# Patient Record
Sex: Female | Born: 1950 | Race: Black or African American | Hispanic: Refuse to answer | Smoking: Never smoker
Health system: Southern US, Community
[De-identification: ages and names within clinical notes are randomized; demographics above are authoritative.]

## PROBLEM LIST (undated history)

## (undated) DIAGNOSIS — I10 Essential (primary) hypertension: Secondary | ICD-10-CM

## (undated) DIAGNOSIS — M1990 Unspecified osteoarthritis, unspecified site: Secondary | ICD-10-CM

## (undated) DIAGNOSIS — E119 Type 2 diabetes mellitus without complications: Secondary | ICD-10-CM

## (undated) HISTORY — DX: Type 2 diabetes mellitus without complications: E11.9

---

## 2001-07-14 ENCOUNTER — Emergency Department (HOSPITAL_COMMUNITY): Admission: EM | Admit: 2001-07-14 | Discharge: 2001-07-14 | Payer: Self-pay | Admitting: Emergency Medicine

## 2001-07-14 ENCOUNTER — Encounter: Payer: Self-pay | Admitting: Emergency Medicine

## 2003-03-10 ENCOUNTER — Emergency Department (HOSPITAL_COMMUNITY): Admission: EM | Admit: 2003-03-10 | Discharge: 2003-03-10 | Payer: Self-pay | Admitting: Family Medicine

## 2003-12-07 ENCOUNTER — Emergency Department (HOSPITAL_COMMUNITY): Admission: EM | Admit: 2003-12-07 | Discharge: 2003-12-07 | Payer: Self-pay | Admitting: Emergency Medicine

## 2006-04-18 ENCOUNTER — Emergency Department (HOSPITAL_COMMUNITY): Admission: EM | Admit: 2006-04-18 | Discharge: 2006-04-18 | Payer: Self-pay | Admitting: Emergency Medicine

## 2006-09-02 ENCOUNTER — Emergency Department (HOSPITAL_COMMUNITY): Admission: EM | Admit: 2006-09-02 | Discharge: 2006-09-02 | Payer: Self-pay | Admitting: Emergency Medicine

## 2007-02-22 ENCOUNTER — Inpatient Hospital Stay (HOSPITAL_COMMUNITY): Admission: EM | Admit: 2007-02-22 | Discharge: 2007-02-24 | Payer: Self-pay | Admitting: Emergency Medicine

## 2007-02-22 ENCOUNTER — Ambulatory Visit: Payer: Self-pay | Admitting: Cardiology

## 2007-02-23 ENCOUNTER — Encounter (INDEPENDENT_AMBULATORY_CARE_PROVIDER_SITE_OTHER): Payer: Self-pay | Admitting: Internal Medicine

## 2007-03-19 ENCOUNTER — Emergency Department (HOSPITAL_COMMUNITY): Admission: EM | Admit: 2007-03-19 | Discharge: 2007-03-19 | Payer: Self-pay | Admitting: Emergency Medicine

## 2007-04-01 ENCOUNTER — Emergency Department (HOSPITAL_COMMUNITY): Admission: EM | Admit: 2007-04-01 | Discharge: 2007-04-01 | Payer: Self-pay | Admitting: Emergency Medicine

## 2007-04-11 ENCOUNTER — Emergency Department (HOSPITAL_COMMUNITY): Admission: EM | Admit: 2007-04-11 | Discharge: 2007-04-11 | Payer: Self-pay | Admitting: Family Medicine

## 2007-04-18 ENCOUNTER — Emergency Department (HOSPITAL_COMMUNITY): Admission: EM | Admit: 2007-04-18 | Discharge: 2007-04-18 | Payer: Self-pay | Admitting: Family Medicine

## 2007-05-12 ENCOUNTER — Emergency Department (HOSPITAL_COMMUNITY): Admission: EM | Admit: 2007-05-12 | Discharge: 2007-05-12 | Payer: Self-pay | Admitting: Emergency Medicine

## 2007-07-02 ENCOUNTER — Emergency Department (HOSPITAL_COMMUNITY): Admission: EM | Admit: 2007-07-02 | Discharge: 2007-07-02 | Payer: Self-pay | Admitting: Emergency Medicine

## 2007-07-11 ENCOUNTER — Encounter: Admission: RE | Admit: 2007-07-11 | Discharge: 2007-07-11 | Payer: Self-pay | Admitting: Chiropractic Medicine

## 2009-07-03 ENCOUNTER — Ambulatory Visit (HOSPITAL_COMMUNITY): Admission: RE | Admit: 2009-07-03 | Discharge: 2009-07-03 | Payer: Self-pay | Admitting: Family Medicine

## 2010-03-21 LAB — COMPREHENSIVE METABOLIC PANEL
CO2: 27 mEq/L (ref 19–32)
Calcium: 9.2 mg/dL (ref 8.4–10.5)
Creatinine, Ser: 0.94 mg/dL (ref 0.4–1.2)
GFR calc Af Amer: >60 mL/min (ref >60)
GFR calc non Af Amer: >60 mL/min (ref >60)
Glucose, Bld: 115 mg/dL — ABNORMAL HIGH (ref 70–99)
Sodium: 140 mEq/L (ref 135–145)
Total Protein: 7.2 g/dL (ref 6.0–8.3)

## 2010-03-21 LAB — CBC
HCT: 37.3 % (ref 36.0–46.0)
Hemoglobin: 12.5 g/dL (ref 12.0–15.0)
MCH: 27.2 pg (ref 26.0–34.0)
MCHC: 33.6 g/dL (ref 30.0–36.0)
RDW: 16 % — ABNORMAL HIGH (ref 11.5–15.5)

## 2010-03-21 LAB — LIPID PANEL
Cholesterol: 172 mg/dL (ref 0–200)
HDL: 53 mg/dL (ref >39)
Total CHOL/HDL Ratio: 3.2 RATIO
Triglycerides: 85 mg/dL (ref <150)

## 2010-05-18 NOTE — H&P (Signed)
NAMEPAQUITA, PRINTY NO.:  [id number]   MEDICAL RECORD NO.:  [id number]          PATIENT TYPE:  EMS   LOCATION:  MAJO                         FACILITY:  [facility]   PHYSICIAN:  Ladell Pier, M.D.   DATE OF BIRTH:  [birth year]   DATE OF ADMISSION:  02/22/2007  DATE OF DISCHARGE:                              HISTORY & PHYSICAL   CHIEF COMPLAINT:  Shortness of breath.   HISTORY OF PRESENT ILLNESS:  The patient is a 60 year old African-  American female who presented to the emergency room secondary to  increase in shortness of breath.  It has been going on for about a week.  She saw Dr. Ronne Binning 3 days ago.  Was given an albuterol inhaler, but  she noted that the shortness of breath has not improved.  She has had no  fevers at home, but she states that when she went to Dr. Dimas Millin  office on Monday her temperature was up to 103.  She has had no cough.   PAST MEDICAL HISTORY:  Significant for morbid obesity.  She has had  pelvic inflammatory disease for 12 years.  No other significant past  medical history.   FAMILY HISTORY:  Mother has hypertension.  She is 76. Her father died in  the [place] War.   SOCIAL HISTORY:  She moved here from [place].  She has 3 children.  She  quit tobacco many years ago.  She only smoked about 4 years, when she  was in college.  No alcohol use.  She is employed at [employer].   MEDICATIONS:  Albuterol p.r.n., recently started.   ALLERGIES:  None.   REVIEW OF SYSTEMS:  As per stated in the HPI.  Otherwise negative.   PHYSICAL EXAMINATION:  VITAL SIGNS: Temperature 97, blood pressure  174/111, pulse 91, respirations 16, pulse ox 100% on 2 L.  HEENT:  Normocephalic, atraumatic.  Pupils reactive to light.  Throat  without erythema.  CARDIOVASCULAR:  She is slightly tachycardic.  LUNGS:  Clear to auscultation bilaterally.  ABDOMEN:  Obese.  Positive bowel sounds.  EXTREMITIES:  There is 2+ edema bilaterally.   LABORATORY  DATA:  WBC 10.5, hemoglobin 11.7, MCV 78.9, platelet 288.  Myoglobin 70, MB 2.1, troponin less than 0.05.  Sodium 140, potassium  3.6, chloride 106, BUN 15, glucose 143, creatinine 1.1.  Chest x-ray  showed cardiomegaly with vascular congestion and suspected early  interstitial edema.  Focal opacity in the peripheral left upper lobe,  atelectasis versus early infiltrate.  Recommend followup chest x-ray.  EKG sinus bradycardia, 56, with some PVCs, bigeminy.   ASSESSMENT:  1. Pneumonia.  2. Pulmonary edema with increased BNP.  3. Hypertensive urgency.  4. Normocytic anemia.  5. Morbid obesity.  6. Peripheral edema.   PLAN:  We will admit the patient to the hospital.  We will start IV  Avelox.  we will get a 2-D echo, fasting lipid panel.  We will treat her  with p.r.n. Lopressor for systolic blood pressure greater than 160.  We  will cycle cardiac enzymes.  We will treat her with Lasix 40  IV q.12h.  and KCl 40 daily, and cardiac diet and exercise.  The patient most  likely has hypertension.  She stated that she has been to the doctor's  office recently and her blood pressure was normal so we will monitor her  blood pressure for now.  If it continues to be elevated we will start  her on a regular dose scheduled antihypertensive.      Ladell Pier, M.D.  Electronically Signed     NJ/MEDQ  D:  02/22/2007  T:  02/22/2007  Job:  [id number]   cc:   Dr. Ronne Binning

## 2010-05-18 NOTE — Discharge Summary (Signed)
NAMEMARGERITE, [name]             ACCOUNT NO.:  [id number]   MEDICAL RECORD NO.:  [id number]          PATIENT TYPE:  INP   LOCATION:  4731                         FACILITY:  [facility]   PHYSICIAN:  Ladell Pier, M.D.   DATE OF BIRTH:  17-Mar-1950   DATE OF ADMISSION:  02/22/2007  DATE OF DISCHARGE:  02/24/2007                               DISCHARGE SUMMARY   DISCHARGE DIAGNOSES:  1. Shortness of breath secondary to pulmonary edema and pneumonia.  2. Elevated BNP.  3. Hypertensive urgency.  4. Normocytic anemia.  5. Morbid obesity.  6. Peripheral edema.  7. Abnormal liver function test.  8. Hyperglycemia.   DISCHARGE MEDICATIONS:  1. Avelox 400 mg daily x7 days.  2. HCTZ 25 mg daily.  3. Lotensin 20 mg daily.  4. Lasix 20 mg as needed daily p.r.n. for edema.   FOLLOWUP APPOINTMENT:  Patient to follow up with Dr. Ronne Binning early this  coming up week.   PROCEDURES:  None.   CONSULTANTS:  None.   HPI:  Patient is a 60 year old African-American female that presented to  the emergency room secondary to increasing shortness of breath.  Patient  stated that this has been going on for about a week.  She saw Dr.  Ronne Binning 3 days ago, was given an albuterol inhaler.  She stated that  her symptoms continued to get worse.  Please see admission note for  remainder of history.   PAST MEDICAL HISTORY/FAMILY HISTORY/SOCIAL HISTORY/MEDS/ALLERGIES/REVIEW  OF SYSTEM:  Per Admission H&P.   PHYSICAL EXAM ON DISCHARGE:  Temperature 98.1, pulse of 93, respirations  20, blood pressure 135/91, pulse ox. 96% on room air.  HEENT:  Head is normocephalic, atraumatic.  Pupils reactive to light.  Throat without erythema.  CARDIOVASCULAR:  Regular rate, rhythm.  LUNGS:  Clear bilaterally.  ABDOMEN:  Positive bowel sounds.  EXTREMITIES:  Trace edema.   HOSPITAL COURSE:  1. Pneumonia:  Patient was admitted to the hospital, started on IV      Avelox and given neb treatments.  Her symptoms improved.  2. Pulmonary edema/elevated BNP:  She had pulmonary edema and elevated      BNP.  This could be secondary to the hypertensive urgency.  Her 2D      echocardiogram showed normal pump and function.  Her cardiac      enzymes the relative index was slightly elevated.  This could be      from the elevated blood pressure.  She should follow up with her      primary care physician to see if further workup is needed.  3. Hyperglycemia:  Her blood sugar on her morning lab was elevated.      Will check a hemoglobin A1c but she should follow up with her      primary care doctor to be screened for diabetes.  4. Normocytic anemia:  Patient should also follow up with her primary      care physician secondary to anemia.  Anemia panel was done in the      hospital.  I am not sure when her last colonoscopy was.  5.  Peripheral edema:  She was given Lasix which improved her      congestion, vascular congestion on chest x-ray and also improved      her breathing and her peripheral edema.  6. Abnormal LFTs:  Liver function was slightly elevated, this could be      secondary to hepatic congestion.  She should follow up for she had      an ultrasound ordered which has not been done yet.  She should      follow up outpatient for further workup.  7. Hypoalbuminemia:  Her abdomen was slightly down.  UA was ordered      while she was an inpatient which was not done, will try to get it      done before she leaves.  She should follow up for the results with      her primary care doctor as well.  8. Pulmonary infiltrate:  Patient to follow for repeat chest x-ray to      evaluate resolution of pneumonia.  9. Morbid obesity:  Encouraged diet and exercise.  Patient could have      obstructive sleep apnea as well with her weight and her morbid      obesity.  She could get that worked up outpatient.   Blood cultures negative x2.  CK 89, MB 1.7, sodium 141, potassium 3.6,  chloride 102, CO2 30, glucose 158, BUN 18,  creatinine 1.18.  AST 29, ALT  69, total protein 6.1, albumin 3, TSH 1.189, total cholesterol 141,  triglycerides 164, HDL 38, LDL 70.  Anemia panel:  Absolute retic count  is 95, iron 50, TIBC 339, % sats 15, Vitamin B12 level of 289, ferritin  58, folate 16.2, BNP 699.  First set of cardiac enzymes:  CK 126, MB  3.8, relative index 3, troponin is 0.04.  Chest x-ray showed  cardiomegaly with vascular congestion, suspect early interstitial edema,  focal opacity in the periperipheral left upper lobe, atelectasis vs.  early infiltrate.  Recommend followup chest x-ray for clearing.  Two-D  echocardiogram:  Overall LV systolic function was 50-55%, study was  inadequate for the evaluation of left ventricular regional wall motion.      Ladell Pier, M.D.  Electronically Signed     NJ/MEDQ  D:  02/24/2007  T:  02/25/2007  Job:  [id number]   cc:   Lorelle Formosa, M.D.

## 2010-09-24 LAB — LIPID PANEL
Cholesterol: 141
LDL Cholesterol: 70

## 2010-09-24 LAB — I-STAT 8, (EC8 V) (CONVERTED LAB)
Acid-Base Excess: 1
Bicarbonate: 26.3 — ABNORMAL HIGH
Chloride: 106
pCO2, Ven: 43.6 — ABNORMAL LOW
pH, Ven: 7.389 — ABNORMAL HIGH

## 2010-09-24 LAB — COMPREHENSIVE METABOLIC PANEL
AST: 29
Albumin: 3 — ABNORMAL LOW
Alkaline Phosphatase: 155 — ABNORMAL HIGH
BUN: 18
BUN: 20
CO2: 29
Chloride: 105
Creatinine, Ser: 1.14
Creatinine, Ser: 1.18
GFR calc Af Amer: 57 — ABNORMAL LOW
GFR calc non Af Amer: 49 — ABNORMAL LOW
Potassium: 3.6
Total Bilirubin: 0.5
Total Protein: 6.1

## 2010-09-24 LAB — CBC
HCT: 34.3 — ABNORMAL LOW
HCT: 35.1 — ABNORMAL LOW
HCT: 36.2
Hemoglobin: 11.7 — ABNORMAL LOW
MCHC: 32.5
MCV: 78.9
MCV: 79.9
Platelets: 284
Platelets: 288
RDW: 18.2 — ABNORMAL HIGH
RDW: 18.3 — ABNORMAL HIGH
RDW: 18.5 — ABNORMAL HIGH
WBC: 12.6 — ABNORMAL HIGH

## 2010-09-24 LAB — CK TOTAL AND CKMB (NOT AT ARMC)
CK, MB: 1.5
CK, MB: 1.7
CK, MB: 2.2
CK, MB: 3.8
Relative Index: INVALID
Total CK: 89
Total CK: 93

## 2010-09-24 LAB — POCT I-STAT CREATININE: Operator id: 114141

## 2010-09-24 LAB — CULTURE, BLOOD (ROUTINE X 2)

## 2010-09-24 LAB — DIFFERENTIAL
Basophils Absolute: 0
Basophils Relative: 0
Eosinophils Absolute: 0.1
Eosinophils Relative: 1
Neutrophils Relative %: 81 — ABNORMAL HIGH

## 2010-09-24 LAB — B-NATRIURETIC PEPTIDE (CONVERTED LAB): Pro B Natriuretic peptide (BNP): 699 — ABNORMAL HIGH

## 2010-09-24 LAB — FERRITIN: Ferritin: 58 (ref 10–291)

## 2010-09-24 LAB — POCT CARDIAC MARKERS
CKMB, poc: 2.1
Myoglobin, poc: 70
Troponin i, poc: <0.05

## 2010-09-24 LAB — URINALYSIS, ROUTINE W REFLEX MICROSCOPIC
Ketones, ur: NEGATIVE
Nitrite: NEGATIVE
Protein, ur: NEGATIVE

## 2010-09-24 LAB — HEMOGLOBIN A1C: Mean Plasma Glucose: 168

## 2010-09-24 LAB — IRON AND TIBC
Iron: 50
Saturation Ratios: 15 — ABNORMAL LOW
TIBC: 339
UIBC: 289

## 2010-09-24 LAB — RETICULOCYTES: RBC.: 4.75

## 2010-09-24 LAB — VITAMIN B12: Vitamin B-12: 503 (ref 211–911)

## 2010-09-27 LAB — CBC
HCT: 37.2
Hemoglobin: 12.1
MCHC: 32.6
MCV: 77.7 — ABNORMAL LOW
Platelets: 282
RBC: 4.79
RDW: 18.5 — ABNORMAL HIGH
WBC: 10.6 — ABNORMAL HIGH

## 2010-09-27 LAB — DIFFERENTIAL
Basophils Absolute: 0.1
Lymphocytes Relative: 16
Lymphs Abs: 1.7
Monocytes Relative: 4
Neutrophils Relative %: 78 — ABNORMAL HIGH

## 2010-09-27 LAB — BASIC METABOLIC PANEL WITH GFR
BUN: 16
CO2: 25
Calcium: 9.1
Chloride: 106
Creatinine, Ser: 0.91
GFR calc non Af Amer: >60
Glucose, Bld: 108 — ABNORMAL HIGH
Potassium: 3.4 — ABNORMAL LOW
Sodium: 139

## 2010-09-28 LAB — POCT I-STAT, CHEM 8
Creatinine, Ser: 1.3 — ABNORMAL HIGH
Hemoglobin: 13.9
Sodium: 141
TCO2: 28

## 2012-01-22 ENCOUNTER — Emergency Department (HOSPITAL_COMMUNITY): Payer: Medicare Other

## 2012-01-22 ENCOUNTER — Emergency Department (HOSPITAL_COMMUNITY)
Admission: EM | Admit: 2012-01-22 | Discharge: 2012-01-22 | Disposition: A | Discharge status: Home / Self Care | Payer: Medicare Other | Attending: Emergency Medicine | Admitting: Emergency Medicine

## 2012-01-22 ENCOUNTER — Other Ambulatory Visit: Payer: Self-pay

## 2012-01-22 ENCOUNTER — Encounter (HOSPITAL_COMMUNITY): Payer: Self-pay | Admitting: Nurse Practitioner

## 2012-01-22 DIAGNOSIS — Z79899 Other long term (current) drug therapy: Secondary | ICD-10-CM | POA: Insufficient documentation

## 2012-01-22 DIAGNOSIS — R062 Wheezing: Secondary | ICD-10-CM | POA: Insufficient documentation

## 2012-01-22 DIAGNOSIS — Z8739 Personal history of other diseases of the musculoskeletal system and connective tissue: Secondary | ICD-10-CM | POA: Insufficient documentation

## 2012-01-22 DIAGNOSIS — I10 Essential (primary) hypertension: Secondary | ICD-10-CM | POA: Insufficient documentation

## 2012-01-22 HISTORY — DX: Essential (primary) hypertension: I10

## 2012-01-22 LAB — POCT I-STAT TROPONIN I: Troponin i, poc: 0 ng/mL (ref 0.00–0.08)

## 2012-01-22 LAB — BASIC METABOLIC PANEL
CO2: 29 mEq/L (ref 19–32)
Calcium: 10.4 mg/dL (ref 8.4–10.5)
GFR calc Af Amer: 81 mL/min — ABNORMAL LOW (ref >90)
GFR calc non Af Amer: 69 mL/min — ABNORMAL LOW (ref >90)
Sodium: 138 mEq/L (ref 135–145)

## 2012-01-22 LAB — CBC
MCH: 26.1 pg (ref 26.0–34.0)
MCHC: 33.4 g/dL (ref 30.0–36.0)
Platelets: 301 10*3/uL (ref 150–400)
RBC: 5.14 MIL/uL — ABNORMAL HIGH (ref 3.87–5.11)

## 2012-01-22 LAB — PRO B NATRIURETIC PEPTIDE: Pro B Natriuretic peptide (BNP): 435.3 pg/mL — ABNORMAL HIGH (ref 0–125)

## 2012-01-22 MED ORDER — LISINOPRIL-HYDROCHLOROTHIAZIDE 10-12.5 MG PO TABS: 1.0000 | ORAL_TABLET | Freq: Every day | ORAL | Status: DC

## 2012-01-22 MED ORDER — ALBUTEROL SULFATE HFA 108 (90 BASE) MCG/ACT IN AERS: 1.0000 | INHALATION_SPRAY | Freq: Four times a day (QID) | RESPIRATORY_TRACT | Status: AC | PRN

## 2012-01-22 NOTE — ED Provider Notes (Signed)
History     CSN: [id number]  Arrival date & time 01/22/12  1226   First MD Initiated Contact with Patient 01/22/12 1254      Chief Complaint  Patient presents with  . Shortness of Breath     HPI Pt reports she has been feeling sob for past months. Reports sob initially started with exertion but now even at rest she feels sob. Can speak in full sentences but does become moderately labored. Reports she had a similar episode of SOB 5 years ago which she was hospitalized for and "they never found anything wrong." pt is A&Ox4, denies pain. States she was HTN 5 years ago but stopped taking her BP meds  Past Medical History  Diagnosis Date  . Arthritis   . Hypertension     Past Surgical History  Procedure Date  . Cesarean section     History reviewed. No pertinent family history.  History  Substance Use Topics  . Smoking status: Never Smoker   . Smokeless tobacco: Not on file  . Alcohol Use: No    OB History    Grav Para Term Preterm Abortions TAB SAB Ect Mult Living                  Review of Systems All other systems reviewed and are negative Allergies  Review of patient's allergies indicates no known allergies.  Home Medications   Current Outpatient Rx  Name  Route  Sig  Dispense  Refill  . MUCINEX PO   Oral   Take 1 tablet by mouth daily as needed. For congestion         . ADULT MULTIVITAMIN W/MINERALS CH   Oral   Take 1 tablet by mouth daily.         Marland Kitchen OVER THE COUNTER MEDICATION   Oral   Take 300 mg by mouth daily. Omega xl proprietary blend: Hospital doctor (Perna canaliculus), oil extract (PCSO-524) containing Omega Fatty Acids, Extra Virgin Olive Oil, d-alpha Tocopherol (Vitamin E) 300 mg per  2 capsules         . VISINE OP   Both Eyes   Place 2 drops into both eyes daily as needed. For irritated or red eyes         . ALBUTEROL SULFATE HFA 108 (90 BASE) MCG/ACT IN AERS   Inhalation   Inhale 1-2 puffs into the lungs every 6 (six)  hours as needed for wheezing.   1 Inhaler   0   . LISINOPRIL-HYDROCHLOROTHIAZIDE 10-12.5 MG PO TABS   Oral   Take 1 tablet by mouth daily.   30 tablet   0     BP 199/120  Pulse 112  Temp 97.4 F (36.3 C) (Oral)  Resp 24  SpO2 98%  Physical Exam  Nursing note and vitals reviewed. Constitutional: She is oriented to person, place, and time. She appears well-developed and well-nourished. No distress.       Morbid obesity  HENT:  Head: Normocephalic and atraumatic.  Eyes: Pupils are equal, round, and reactive to light.  Neck: Normal range of motion.  Cardiovascular: Normal rate and intact distal pulses.   Pulmonary/Chest: No respiratory distress.  Abdominal: Normal appearance. She exhibits no distension.  Musculoskeletal: Normal range of motion.  Neurological: She is alert and oriented to person, place, and time. No cranial nerve deficit.  Skin: Skin is warm and dry. No rash noted.  Psychiatric: She has a normal mood and affect. Her behavior is normal.  ED Course  Procedures (including critical care time)  Date: 01/22/2012  Rate: 103  Rhythm: normal sinus rhythm with frequent PVCs  QRS Axis: normal  Intervals: normal  ST/T Wave abnormalities: normal  Conduction Disutrbances: none  Narrative Interpretation: Abnormal EKG but not significantly change from tracing of 2009.  EKG shows PVCs were present at that time     Labs Reviewed  CBC - Abnormal; Notable for the following:    WBC 12.0 (*)     RBC 5.14 (*)     RDW 16.9 (*)     All other components within normal limits  BASIC METABOLIC PANEL - Abnormal; Notable for the following:    Glucose, Bld 104 (*)     GFR calc non Af Amer 69 (*)     GFR calc Af Amer 81 (*)     All other components within normal limits  PRO B NATRIURETIC PEPTIDE - Abnormal; Notable for the following:    Pro B Natriuretic peptide (BNP) 435.3 (*)     All other components within normal limits  POCT I-STAT TROPONIN I   Dg Chest 2  View  01/22/2012  *RADIOLOGY REPORT*  Clinical Data: Shortness of breath.  Worsening.  CHEST - 2 VIEW  Comparison: 07/03/2009  Findings: Mild cardiomegaly appears stable.  Mediastinal and hilar contours are normal.  Normal pulmonary vascularity.  Lung volumes are within normal limits.  The lungs are clear.  No focal airspace opacity, effusion, edema, or pneumothorax.  No interval change compared to prior study.  Trachea midline.  No acute osseous abnormality.  IMPRESSION: Stable mild cardiomegaly.  No acute cardiopulmonary disease identified.   Original Report Authenticated By: Britta Mccreedy, M.D.      1. Wheezing   2. Hypertension       MDM         Nelia Shi, MD 01/22/12 2125

## 2012-01-22 NOTE — ED Notes (Addendum)
Pt reports she has been feeling sob for past months. Reports sob initially started with exertion but now even at rest she feels sob. Can speak in full sentences but does become moderately labored. Reports she had a similar episode of SOB 5 years ago which she was hospitalized for and "they never found anything wrong." pt is A&Ox4, denies pain. States she was HTN 5 years ago but stopped taking her BP meds

## 2012-07-04 ENCOUNTER — Encounter (HOSPITAL_COMMUNITY): Payer: Self-pay | Admitting: *Deleted

## 2012-07-04 ENCOUNTER — Emergency Department (HOSPITAL_COMMUNITY): Payer: Medicare Other

## 2012-07-04 ENCOUNTER — Inpatient Hospital Stay (HOSPITAL_COMMUNITY)
Admission: EM | Admit: 2012-07-04 | Discharge: 2012-07-11 | DRG: 637 | Disposition: A | Discharge status: Home / Self Care | Payer: Medicare Other | Attending: Internal Medicine | Admitting: Internal Medicine

## 2012-07-04 DIAGNOSIS — E872 Acidosis: Secondary | ICD-10-CM

## 2012-07-04 DIAGNOSIS — I5022 Chronic systolic (congestive) heart failure: Secondary | ICD-10-CM

## 2012-07-04 DIAGNOSIS — R0602 Shortness of breath: Secondary | ICD-10-CM | POA: Diagnosis present

## 2012-07-04 DIAGNOSIS — I10 Essential (primary) hypertension: Secondary | ICD-10-CM | POA: Diagnosis present

## 2012-07-04 DIAGNOSIS — I130 Hypertensive heart and chronic kidney disease with heart failure and stage 1 through stage 4 chronic kidney disease, or unspecified chronic kidney disease: Secondary | ICD-10-CM | POA: Diagnosis present

## 2012-07-04 DIAGNOSIS — Z91199 Patient's noncompliance with other medical treatment and regimen due to unspecified reason: Secondary | ICD-10-CM

## 2012-07-04 DIAGNOSIS — E1010 Type 1 diabetes mellitus with ketoacidosis without coma: Principal | ICD-10-CM | POA: Diagnosis present

## 2012-07-04 DIAGNOSIS — Z79899 Other long term (current) drug therapy: Secondary | ICD-10-CM

## 2012-07-04 DIAGNOSIS — I5023 Acute on chronic systolic (congestive) heart failure: Secondary | ICD-10-CM | POA: Diagnosis present

## 2012-07-04 DIAGNOSIS — E871 Hypo-osmolality and hyponatremia: Secondary | ICD-10-CM | POA: Diagnosis present

## 2012-07-04 DIAGNOSIS — I509 Heart failure, unspecified: Secondary | ICD-10-CM | POA: Diagnosis present

## 2012-07-04 DIAGNOSIS — I252 Old myocardial infarction: Secondary | ICD-10-CM

## 2012-07-04 DIAGNOSIS — Z6841 Body Mass Index (BMI) 40.0 and over, adult: Secondary | ICD-10-CM

## 2012-07-04 DIAGNOSIS — J45909 Unspecified asthma, uncomplicated: Secondary | ICD-10-CM | POA: Diagnosis present

## 2012-07-04 DIAGNOSIS — Z9119 Patient's noncompliance with other medical treatment and regimen: Secondary | ICD-10-CM

## 2012-07-04 DIAGNOSIS — E781 Pure hyperglyceridemia: Secondary | ICD-10-CM | POA: Diagnosis present

## 2012-07-04 DIAGNOSIS — E1029 Type 1 diabetes mellitus with other diabetic kidney complication: Secondary | ICD-10-CM | POA: Diagnosis present

## 2012-07-04 DIAGNOSIS — L680 Hirsutism: Secondary | ICD-10-CM | POA: Diagnosis present

## 2012-07-04 DIAGNOSIS — E1110 Type 2 diabetes mellitus with ketoacidosis without coma: Secondary | ICD-10-CM

## 2012-07-04 DIAGNOSIS — I110 Hypertensive heart disease with heart failure: Secondary | ICD-10-CM

## 2012-07-04 DIAGNOSIS — N179 Acute kidney failure, unspecified: Secondary | ICD-10-CM | POA: Diagnosis present

## 2012-07-04 DIAGNOSIS — N289 Disorder of kidney and ureter, unspecified: Secondary | ICD-10-CM

## 2012-07-04 DIAGNOSIS — I2510 Atherosclerotic heart disease of native coronary artery without angina pectoris: Secondary | ICD-10-CM | POA: Diagnosis present

## 2012-07-04 DIAGNOSIS — N058 Unspecified nephritic syndrome with other morphologic changes: Secondary | ICD-10-CM | POA: Diagnosis present

## 2012-07-04 DIAGNOSIS — N189 Chronic kidney disease, unspecified: Secondary | ICD-10-CM | POA: Diagnosis present

## 2012-07-04 DIAGNOSIS — E1101 Type 2 diabetes mellitus with hyperosmolarity with coma: Secondary | ICD-10-CM | POA: Diagnosis present

## 2012-07-04 DIAGNOSIS — E1065 Type 1 diabetes mellitus with hyperglycemia: Secondary | ICD-10-CM | POA: Diagnosis present

## 2012-07-04 DIAGNOSIS — M1990 Unspecified osteoarthritis, unspecified site: Secondary | ICD-10-CM | POA: Diagnosis present

## 2012-07-04 DIAGNOSIS — N182 Chronic kidney disease, stage 2 (mild): Secondary | ICD-10-CM | POA: Diagnosis present

## 2012-07-04 DIAGNOSIS — E46 Unspecified protein-calorie malnutrition: Secondary | ICD-10-CM | POA: Diagnosis present

## 2012-07-04 DIAGNOSIS — E119 Type 2 diabetes mellitus without complications: Secondary | ICD-10-CM | POA: Diagnosis present

## 2012-07-04 HISTORY — DX: Morbid (severe) obesity due to excess calories: E66.01

## 2012-07-04 HISTORY — DX: Unspecified osteoarthritis, unspecified site: M19.90

## 2012-07-04 LAB — COMPREHENSIVE METABOLIC PANEL
ALT: 25 U/L (ref 0–35)
AST: 18 U/L (ref 0–37)
Albumin: 3.8 g/dL (ref 3.5–5.2)
Alkaline Phosphatase: 146 U/L — ABNORMAL HIGH (ref 39–117)
CO2: 19 mEq/L (ref 19–32)
Chloride: 84 mEq/L — ABNORMAL LOW (ref 96–112)
GFR calc non Af Amer: 38 mL/min — ABNORMAL LOW (ref >90)
Potassium: 4.4 mEq/L (ref 3.5–5.1)
Sodium: 130 mEq/L — ABNORMAL LOW (ref 135–145)
Total Bilirubin: 0.3 mg/dL (ref 0.3–1.2)

## 2012-07-04 LAB — POCT I-STAT 3, VENOUS BLOOD GAS (G3P V)
Acid-base deficit: 5 mmol/L — ABNORMAL HIGH (ref 0.0–2.0)
Acid-base deficit: 4 mmol/L — ABNORMAL HIGH (ref 0.0–2.0)
O2 Saturation: 75 %
Patient temperature: 97.4
TCO2: 22 mmol/L (ref 0–100)
pH, Ven: 7.309 — ABNORMAL HIGH (ref 7.250–7.300)
pO2, Ven: 42 mmHg (ref 30.0–45.0)

## 2012-07-04 LAB — GLUCOSE, CAPILLARY
Glucose-Capillary: 579 mg/dL (ref 70–99)
Glucose-Capillary: 441 mg/dL — ABNORMAL HIGH (ref 70–99)
Glucose-Capillary: 446 mg/dL — ABNORMAL HIGH (ref 70–99)

## 2012-07-04 LAB — CBC
HCT: 42 % (ref 36.0–46.0)
Hemoglobin: 13.9 g/dL (ref 12.0–15.0)
Platelets: 285 10*3/uL (ref 150–400)
RDW: 16.4 % — ABNORMAL HIGH (ref 11.5–15.5)

## 2012-07-04 LAB — KETONES, QUALITATIVE

## 2012-07-04 MED ORDER — INSULIN REGULAR HUMAN 100 UNIT/ML IJ SOLN
INTRAMUSCULAR | Status: DC
  Administered 2012-07-04: 11.4 [IU]/h via INTRAVENOUS
  Administered 2012-07-04: 5.2 [IU]/h via INTRAVENOUS
  Filled 2012-07-04: qty 1

## 2012-07-04 MED ORDER — ALBUTEROL SULFATE HFA 108 (90 BASE) MCG/ACT IN AERS
1.0000 | INHALATION_SPRAY | Freq: Four times a day (QID) | RESPIRATORY_TRACT | Status: DC | PRN
  Filled 2012-07-04: qty 6.7

## 2012-07-04 MED ORDER — POTASSIUM CHLORIDE 10 MEQ/100ML IV SOLN
10.0000 meq | INTRAVENOUS | Status: AC
  Administered 2012-07-04 (×2): 10 meq via INTRAVENOUS
  Filled 2012-07-04: qty 200

## 2012-07-04 MED ORDER — SODIUM CHLORIDE 0.9 % IV SOLN
INTRAVENOUS | Status: DC
  Administered 2012-07-06: 02:00:00 via INTRAVENOUS

## 2012-07-04 MED ORDER — DEXTROSE-NACL 5-0.45 % IV SOLN
INTRAVENOUS | Status: DC
  Administered 2012-07-05: 05:00:00 via INTRAVENOUS

## 2012-07-04 MED ORDER — HEPARIN SODIUM (PORCINE) 5000 UNIT/ML IJ SOLN
5000.0000 [IU] | Freq: Three times a day (TID) | INTRAMUSCULAR | Status: DC
  Administered 2012-07-04 – 2012-07-11 (×20): 5000 [IU] via SUBCUTANEOUS
  Filled 2012-07-04 (×23): qty 1

## 2012-07-04 MED ORDER — SODIUM CHLORIDE 0.9 % IV BOLUS (SEPSIS)
1000.0000 mL | Freq: Once | INTRAVENOUS | Status: AC
  Administered 2012-07-04: 1000 mL via INTRAVENOUS

## 2012-07-04 MED ORDER — PIPERACILLIN-TAZOBACTAM 3.375 G IVPB
3.3750 g | Freq: Three times a day (TID) | INTRAVENOUS | Status: DC
  Administered 2012-07-05 (×2): 3.375 g via INTRAVENOUS
  Filled 2012-07-04 (×3): qty 50

## 2012-07-04 MED ORDER — MOMETASONE FURO-FORMOTEROL FUM 100-5 MCG/ACT IN AERO
2.0000 | INHALATION_SPRAY | Freq: Two times a day (BID) | RESPIRATORY_TRACT | Status: DC
Start: 2012-07-04 — End: 2012-07-11
  Administered 2012-07-05 – 2012-07-11 (×14): 2 via RESPIRATORY_TRACT
  Filled 2012-07-04: qty 8.8

## 2012-07-04 MED ORDER — INSULIN REGULAR HUMAN 100 UNIT/ML IJ SOLN
INTRAMUSCULAR | Status: DC
  Administered 2012-07-05: 7.5 [IU]/h via INTRAVENOUS
  Administered 2012-07-05: 7.1 [IU]/h via INTRAVENOUS
  Administered 2012-07-05: 23.5 [IU]/h via INTRAVENOUS
  Filled 2012-07-04 (×2): qty 1

## 2012-07-04 MED ORDER — AZITHROMYCIN 500 MG IV SOLR
500.0000 mg | INTRAVENOUS | Status: DC
  Administered 2012-07-04: 500 mg via INTRAVENOUS
  Filled 2012-07-04 (×2): qty 500

## 2012-07-04 MED ORDER — PANTOPRAZOLE SODIUM 40 MG IV SOLR
40.0000 mg | Freq: Two times a day (BID) | INTRAVENOUS | Status: DC
  Administered 2012-07-04 – 2012-07-05 (×3): 40 mg via INTRAVENOUS
  Filled 2012-07-04 (×5): qty 40

## 2012-07-04 MED ORDER — DEXTROSE 50 % IV SOLN: 25.0000 mL | INTRAVENOUS | Status: DC | PRN

## 2012-07-04 MED ORDER — PIPERACILLIN-TAZOBACTAM 3.375 G IVPB 30 MIN
3.3750 g | Freq: Once | INTRAVENOUS | Status: AC
  Administered 2012-07-04: 3.375 g via INTRAVENOUS
  Filled 2012-07-04: qty 50

## 2012-07-04 NOTE — ED Provider Notes (Signed)
History    CSN: [id number] Arrival date & time 07/04/12  1630  First MD Initiated Contact with Patient 07/04/12 1659     Chief Complaint  Patient presents with  . Hyperglycemia   (Consider location/radiation/quality/duration/timing/severity/associated sxs/prior Treatment) HPI Comments: Pt w/ hx of HTN now w/ hyperglycemia, no prior hx of DM. Notes 2-3 wk insidious onset of polyuria/polydypsia, dizziness and blurred vision. Seen by pcp today and noted glucose >600 w/ clinical dehydration concerning for DKA and sent to ED. Pt denies fever, dyspnea, abd pain, chest pain. No n/v/d.   Patient is a 62 y.o. female presenting with general illness. The history is provided by the patient. No language interpreter was used.  Illness Location:  Endocrine Quality:  Hyperglycemia Severity:  Severe Onset quality:  Gradual Timing:  Constant Progression:  Worsening Chronicity:  New Associated symptoms: no abdominal pain, no chest pain, no congestion, no cough, no diarrhea, no fever, no headaches, no nausea, no rash, no shortness of breath, no sore throat and no vomiting    Past Medical History  Diagnosis Date  . Arthritis   . Hypertension   . Obesity    Past Surgical History  Procedure Laterality Date  . Cesarean section     History reviewed. No pertinent family history. History  Substance Use Topics  . Smoking status: Never Smoker   . Smokeless tobacco: Not on file  . Alcohol Use: No   OB History   Grav Para Term Preterm Abortions TAB SAB Ect Mult Living                 Review of Systems  Constitutional: Positive for appetite change. Negative for fever and chills.  HENT: Negative for congestion and sore throat.   Respiratory: Negative for cough and shortness of breath.   Cardiovascular: Negative for chest pain and leg swelling.  Gastrointestinal: Negative for nausea, vomiting, abdominal pain, diarrhea and constipation.  Genitourinary: Positive for frequency. Negative for dysuria.   Skin: Negative for color change and rash.  Neurological: Positive for dizziness. Negative for headaches.  Psychiatric/Behavioral: Negative for confusion and agitation.  All other systems reviewed and are negative.    Allergies  Review of patient's allergies indicates no known allergies.  Home Medications   Current Outpatient Rx  Name  Route  Sig  Dispense  Refill  . albuterol (PROVENTIL HFA;VENTOLIN HFA) 108 (90 BASE) MCG/ACT inhaler   Inhalation   Inhale 1-2 puffs into the lungs every 6 (six) hours as needed for wheezing.   1 Inhaler   0   . amLODipine (NORVASC) 5 MG tablet   Oral   Take 5 mg by mouth daily.         . Fluticasone-Salmeterol (ADVAIR) 250-50 MCG/DOSE AEPB   Inhalation   Inhale 1 puff into the lungs every 12 (twelve) hours.         Marland Kitchen lisinopril-hydrochlorothiazide (PRINZIDE,ZESTORETIC) 20-12.5 MG per tablet   Oral   Take 1 tablet by mouth daily.         . Multiple Vitamin (MULTIVITAMIN WITH MINERALS) TABS   Oral   Take 1 tablet by mouth daily.         Marland Kitchen OVER THE COUNTER MEDICATION   Oral   Take 300 mg by mouth daily. Omega xl proprietary blend: Hospital doctor (Perna canaliculus), oil extract (PCSO-524) containing Omega Fatty Acids, Extra Virgin Olive Oil, d-alpha Tocopherol (Vitamin E) 300 mg per  2 capsules  BP 104/81  Pulse 115  Temp(Src) 97.4 F (36.3 C) (Oral)  Resp 18  SpO2 99% Physical Exam  Vitals reviewed. Constitutional: She is oriented to person, place, and time. She appears well-developed and well-nourished. No distress.  HENT:  Head: Normocephalic and atraumatic.  Mouth/Throat: Mucous membranes are dry.  Eyes: EOM are normal. Pupils are equal, round, and reactive to light.  Neck: Normal range of motion. Neck supple.  Cardiovascular: Regular rhythm.  Tachycardia present.   Pulmonary/Chest: Effort normal. No respiratory distress.  Abdominal: Soft. She exhibits no distension.  Musculoskeletal: Normal range of  motion. She exhibits no edema.  Neurological: She is alert and oriented to person, place, and time.  Skin: Skin is warm and dry.  Psychiatric: She has a normal mood and affect. Her behavior is normal.    ED Course  Procedures (including critical care time) Results for orders placed during the hospital encounter of 07/04/12  GLUCOSE, CAPILLARY      Result Value Range   Glucose-Capillary >600 (*) 70 - 99 mg/dL   Comment 1 Documented in Chart     Comment 2 Notify RN    CBC      Result Value Range   WBC 9.7  4.0 - 10.5 K/uL   RBC 5.30 (*) 3.87 - 5.11 MIL/uL   Hemoglobin 13.9  12.0 - 15.0 g/dL   HCT 41.6  60.6 - 30.1 %   MCV 79.2  78.0 - 100.0 fL   MCH 26.2  26.0 - 34.0 pg   MCHC 33.1  30.0 - 36.0 g/dL   RDW 60.1 (*) 09.3 - 23.5 %   Platelets 285  150 - 400 K/uL  COMPREHENSIVE METABOLIC PANEL      Result Value Range   Sodium 130 (*) 135 - 145 mEq/L   Potassium 4.4  3.5 - 5.1 mEq/L   Chloride 84 (*) 96 - 112 mEq/L   CO2 19  19 - 32 mEq/L   Glucose, Bld 791 (*) 70 - 99 mg/dL   BUN 44 (*) 6 - 23 mg/dL   Creatinine, Ser 5.73 (*) 0.50 - 1.10 mg/dL   Calcium 22.0 (*) 8.4 - 10.5 mg/dL   Total Protein 8.2  6.0 - 8.3 g/dL   Albumin 3.8  3.5 - 5.2 g/dL   AST 18  0 - 37 U/L   ALT 25  0 - 35 U/L   Alkaline Phosphatase 146 (*) 39 - 117 U/L   Total Bilirubin 0.3  0.3 - 1.2 mg/dL   GFR calc non Af Amer 38 (*) >90 mL/min   GFR calc Af Amer 44 (*) >90 mL/min  KETONES, QUALITATIVE      Result Value Range   Acetone, Bld MODERATE (*) NEGATIVE  GLUCOSE, CAPILLARY      Result Value Range   Glucose-Capillary 579 (*) 70 - 99 mg/dL   Comment 1 Notify RN     Comment 2 Documented in Chart    GLUCOSE, CAPILLARY      Result Value Range   Glucose-Capillary 441 (*) 70 - 99 mg/dL   Comment 1 Documented in Chart     Comment 2 Notify RN     Comment 3 Confirm Test in Lab    GLUCOSE, CAPILLARY      Result Value Range   Glucose-Capillary 446 (*) 70 - 99 mg/dL  GLUCOSE, CAPILLARY      Result Value  Range   Glucose-Capillary 321 (*) 70 - 99 mg/dL  CG4 I-STAT (LACTIC ACID)  Result Value Range   Lactic Acid, Venous 2.59 (*) 0.5 - 2.2 mmol/L  POCT I-STAT 3, BLOOD GAS (G3P V)      Result Value Range   pH, Ven 7.309 (*) 7.250 - 7.300   pCO2, Ven 41.4 (*) 45.0 - 50.0 mmHg   pO2, Ven 60.0 (*) 30.0 - 45.0 mmHg   Bicarbonate 21.0  20.0 - 24.0 mEq/L   TCO2 22  0 - 100 mmol/L   O2 Saturation 89.0     Acid-base deficit 5.0 (*) 0.0 - 2.0 mmol/L   Patient temperature 97.4 F     Collection site RADIAL, ALLEN'S TEST ACCEPTABLE     Drawn by RT     Sample type VENOUS    POCT I-STAT 3, BLOOD GAS (G3P V)      Result Value Range   pH, Ven 7.345 (*) 7.250 - 7.300   pCO2, Ven 38.3 (*) 45.0 - 50.0 mmHg   pO2, Ven 42.0  30.0 - 45.0 mmHg   Bicarbonate 21.0  20.0 - 24.0 mEq/L   TCO2 22  0 - 100 mmol/L   O2 Saturation 75.0     Acid-base deficit 4.0 (*) 0.0 - 2.0 mmol/L   Patient temperature 98.0 F     Collection site BRACHIAL ARTERY     Sample type VENOUS    POCT I-STAT TROPONIN I      Result Value Range   Troponin i, poc 0.03  0.00 - 0.08 ng/mL   Comment 3             DG Chest 2 View (Final result)  Result time: 07/04/12 18:46:22    Final result by Rad Results In Interface (07/04/12 18:46:22)    Narrative:   *RADIOLOGY REPORT*  Clinical Data: Dizziness. Hyperglycemia.  CHEST - 2 VIEW  Comparison: PA and lateral chest 01/22/2012.  Findings: Lungs are clear. Heart size is upper normal. No pneumothorax or pleural fluid.  IMPRESSION: No acute disease.   Original Report Authenticated By: Holley Dexter, M.D.         Date: 07/05/2012  Rate: 102  Rhythm: normal sinus rhythm  QRS Axis: normal  Intervals: normal  ST/T Wave abnormalities: normal  Conduction Disutrbances:none  Narrative Interpretation:   Old EKG Reviewed: unchanged   No results found. No diagnosis found.  MDM  Exam as above, tachycardic, clinically dehydrated, exam otherwise unremarkable. Concern for  DKA. Will given IVF, place in insulin gtt and obtain blood gas, cbc, cmp, ketones, u/a, cxr, ecg, lactic acid  Course: labs c/w DKA - metabolic acidosis w/ gap of 27, glucose 791, K 4.4, Cr 1.44, BUN 44, lactic acid 2.59, mod serum acetone, troponin neg, blood gas 7.30/41/60/21 - given 3L IVF and placed on insulin gtt. Repeat glucose in 446s, Vitals and mental status stable. D/w medicine and pt admitted to stepdown unit in serious condition. CXR - NACPF, ECG - no acute ischemia. Doubt infection.   I have personally reviewed labs and imaging and considered in my MDM. Case d/w Dr Manus Gunning.    Audelia Hives, MD 07/05/12 [phone]

## 2012-07-04 NOTE — ED Notes (Addendum)
Pt states that she has had increased urination for 6-7 days. States that when she goes to the restroom she will urinate in the floor before she can get to the toilet. Pt reports dizziness, blurred vision, N/V.

## 2012-07-04 NOTE — H&P (Signed)
[facility] History and Physical  DANYLE BOENING WUJ:[id number] DOB: 08-08-1950 DOA: 07/04/2012  Referring physician: Dr Kara Pacer PCP: Default, Provider, MD  Specialists: None  Chief Complaint: Dizziness.   HPI: [name] is a 62 y.o. female with PMH significant for HTN who was refer for admission by her PCP because of hyperglycemia. Patient has had dizziness, polyuria, polydipsia, for last 5 weeks. No prior history of Diabetes. She relates dry cough for last 2 weeks. She relates decrease appetite. She vomit once yesterday. She has had abdominal pain for last 2 weeks, on and off. She denies abdominal pain right now. She has being so dizzy that she has not been able to ambulate. She denies chest pain, dyspnea, fevers, dysuria.   Review of Systems: Negative except as per HPI.   Past Medical History  Diagnosis Date  . Arthritis   . Hypertension   . Obesity    Past Surgical History  Procedure Laterality Date  . Cesarean section     Social History:  reports that she has never smoked. She does not have any smokeless tobacco history on file. She reports that she does not drink alcohol or use illicit drugs. She is marries, has 2 daughter and one son. She is not working.    No Known Allergies  Family History: Mother has hypertension. She is 76. Her father died in  the [place] War.   Prior to Admission medications   Medication Sig Start Date End Date Taking? Authorizing Provider  albuterol (PROVENTIL HFA;VENTOLIN HFA) 108 (90 BASE) MCG/ACT inhaler Inhale 1-2 puffs into the lungs every 6 (six) hours as needed for wheezing. 01/22/12  Yes Nelia Shi, MD  amLODipine (NORVASC) 5 MG tablet Take 5 mg by mouth daily.   Yes Historical Provider, MD  Fluticasone-Salmeterol (ADVAIR) 250-50 MCG/DOSE AEPB Inhale 1 puff into the lungs every 12 (twelve) hours.   Yes Historical Provider, MD  lisinopril-hydrochlorothiazide (PRINZIDE,ZESTORETIC) 20-12.5 MG per tablet Take 1 tablet by mouth  daily.   Yes Historical Provider, MD  Multiple Vitamin (MULTIVITAMIN WITH MINERALS) TABS Take 1 tablet by mouth daily.   Yes Historical Provider, MD  OVER THE COUNTER MEDICATION Take 300 mg by mouth daily. Omega xl proprietary blend: Hospital doctor (Perna canaliculus), oil extract (PCSO-524) containing Omega Fatty Acids, Extra Virgin Olive Oil, d-alpha Tocopherol (Vitamin E) 300 mg per  2 capsules   Yes Historical Provider, MD   Physical Exam: Filed Vitals:   07/04/12 1651  BP: 104/81  Pulse: 115  Temp: 97.4 F (36.3 C)  TempSrc: Oral  Resp: 18  SpO2: 99%   General Appearance:    Somnolent, Following command, answering questions,  no distress, appears stated age  Head:    Normocephalic, without obvious abnormality, atraumatic  Eyes:    PERRL, conjunctiva/corneas clear, EOM's intact,   Ears:    Normal TM's and external ear canals, both ears  Nose:   Nares normal, septum midline, mucosa normal, no drainage    or sinus tenderness  Throat:   Lips, mucosa, and tongue normal; teeth and gums normal  Neck:   Supple, symmetrical, trachea midline, no adenopathy;    thyroid:  no enlargement/tenderness/nodules; no carotid   bruit or JVD  Back:     Symmetric, no curvature, ROM normal, no CVA tenderness  Lungs:     Clear to auscultation bilaterally, respirations unlabored  Chest Wall:    No tenderness or deformity   Heart:    Regular rate and rhythm, S1 and  S2 normal, no murmur, rub   or gallop     Abdomen:     Soft, non-tender, bowel sounds active all four quadrants,    no masses, no organomegaly        Extremities:   Extremities normal, atraumatic, no cyanosis or edema  Pulses:   2+ and symmetric all extremities  Skin:   Skin color, texture, turgor normal, no rashes or lesions  Lymph nodes:   Cervical, supraclavicular, and axillary nodes normal  Neurologic:   CNII-XII intact, normal strength, sensation and reflexes    throughout    Labs on Admission:  Basic Metabolic  Panel:  Recent Labs Lab 07/04/12 1654  NA 130*  K 4.4  CL 84*  CO2 19  GLUCOSE 791*  BUN 44*  CREATININE 1.44*  CALCIUM 10.7*   Liver Function Tests:  Recent Labs Lab 07/04/12 1654  AST 18  ALT 25  ALKPHOS 146*  BILITOT 0.3  PROT 8.2  ALBUMIN 3.8   CBC:  Recent Labs Lab 07/04/12 1654  WBC 9.7  HGB 13.9  HCT 42.0  MCV 79.2  PLT 285   Cardiac Enzymes: No results found for this basename: CKTOTAL, CKMB, CKMBINDEX, TROPONINI,  in the last 168 hours  BNP (last 3 results)  Recent Labs  01/22/12 1240  PROBNP 435.3*   CBG:  Recent Labs Lab 07/04/12 1651 07/04/12 1857  GLUCAP >600* 579*    Radiological Exams on Admission: Dg Chest 2 View  07/04/2012   *RADIOLOGY REPORT*  Clinical Data: Dizziness.  Hyperglycemia.  CHEST - 2 VIEW  Comparison: PA and lateral chest 01/22/2012.  Findings: Lungs are clear.  Heart size is upper normal.  No pneumothorax or pleural fluid.  IMPRESSION: No acute disease.   Original Report Authenticated By: Holley Dexter, M.D.    Assessment/Plan Active Problems:   DKA (diabetic ketoacidoses)   Acidosis, lactic   Hyponatremia   Renal insufficiency  1-Diabetes, New onset , presents with DKA: Patient presents with Blood sugar at 600, Anion gap at 27, Bicarb at 19. Patient received 3 L IV fluids in the ED. Will continue with insulin Gtt, B-met every 2 hours, replete Potasium as needed. Transition to long acting insulin when gap close. Check Hb A-1c. Will do work up for infection, cycle cardia enzymes.   2-Lactic acid acidosis: In setting of DKA, also in the differential sepsis. IV fluids. Blood culture, urine culture. Repeat lactic acid. BP has remain stable 120 range. I will start zosyn and Azithromycin to cover for PNA , patient has been complaining of cough. Could repeat Chest x ray in am after hydration. No fevers, no leukocytosis.   3-Hyponatremia: pseudohyponatremia in setting of hyperglycemia.   4-Acute renal Failure: Likely  pre renal, in setting dehydration. Cr on admission at 1.4, GFR 44. Continue with IV fluids. Repeat labs in am.   5-Abdominal pain: Abdominal exam benign. This could be related to DKA. Patient denies pain at this time. If pain reoccurs consider CT scan. I will start protonix.  6-Cough: Cover for PNA. Consider repeat chest x ray in am,.  7-HTN: Hold BP medications.       Code Status: presume Full Code.  Family Communication: Discussed care with patient and family at bedside.  Disposition Plan: Expect 3 days inpatient.   Time spent: 75 minutes.   REGALADO,BELKYS [facility] Pager [phone]  If 7PM-7AM, please contact night-coverage [email or web address] Password [facility] 07/04/2012, 7:12 PM

## 2012-07-04 NOTE — ED Notes (Signed)
MD at bedside. 

## 2012-07-04 NOTE — ED Notes (Signed)
513 CBG

## 2012-07-04 NOTE — ED Notes (Signed)
Glucose 441 mg/dl

## 2012-07-04 NOTE — ED Notes (Signed)
Pt aware of need for urine sample.  

## 2012-07-04 NOTE — ED Notes (Addendum)
Reports going to pcp today for not feeling well. Having urinary frequency, incontinence, no appetite, n/v, dizziness and double vision. Was diagnosed new onset diabetes and sent here due to cbg >600.

## 2012-07-04 NOTE — Progress Notes (Signed)
ANTIBIOTIC CONSULT NOTE - INITIAL  Pharmacy Consult for zosyn Indication: Empiric coverage  No Known Allergies   Vital Signs: Temp: 97.4 F (36.3 C) (07/02 1651) Temp src: Oral (07/02 1651) BP: 125/57 mmHg (07/02 1930) Pulse Rate: 92 (07/02 1930) Intake/Output from previous day:   Intake/Output from this shift:    Labs:  Recent Labs  07/04/12 1654  WBC 9.7  HGB 13.9  PLT 285  CREATININE 1.44*   CrCl is unknown because there is no height on file for the current visit. No results found for this basename: VANCOTROUGH, VANCOPEAK, VANCORANDOM, GENTTROUGH, GENTPEAK, GENTRANDOM, TOBRATROUGH, TOBRAPEAK, TOBRARND, AMIKACINPEAK, AMIKACINTROU, AMIKACIN,  in the last 72 hours   Microbiology: No results found for this or any previous visit (from the past 720 hour(s)).  Medical History: Past Medical History  Diagnosis Date  . Arthritis   . Hypertension   . Obesity     Assessment: 62 yo female here with DKA. Blood cultures have been ordered and zosyn ordered for empiric coverage. SCr= 1.44 and CrCl ~ 45 ml/min.   Plan:  -Zosyn 3.375gm IV q8h -Will follow cultures, renal function and clinical progress  Harland German, Pharm D 07/04/2012 7:54 PM

## 2012-07-05 DIAGNOSIS — N182 Chronic kidney disease, stage 2 (mild): Secondary | ICD-10-CM

## 2012-07-05 DIAGNOSIS — N179 Acute kidney failure, unspecified: Secondary | ICD-10-CM

## 2012-07-05 DIAGNOSIS — I10 Essential (primary) hypertension: Secondary | ICD-10-CM | POA: Diagnosis present

## 2012-07-05 DIAGNOSIS — J45909 Unspecified asthma, uncomplicated: Secondary | ICD-10-CM | POA: Diagnosis present

## 2012-07-05 DIAGNOSIS — E119 Type 2 diabetes mellitus without complications: Secondary | ICD-10-CM | POA: Diagnosis present

## 2012-07-05 LAB — BASIC METABOLIC PANEL
BUN: 34 mg/dL — ABNORMAL HIGH (ref 6–23)
BUN: 34 mg/dL — ABNORMAL HIGH (ref 6–23)
CO2: 21 mEq/L (ref 19–32)
CO2: 20 mEq/L (ref 19–32)
CO2: 23 mEq/L (ref 19–32)
Calcium: 9.5 mg/dL (ref 8.4–10.5)
Calcium: 9.3 mg/dL (ref 8.4–10.5)
Calcium: 9.1 mg/dL (ref 8.4–10.5)
Chloride: 101 mEq/L (ref 96–112)
Chloride: 102 mEq/L (ref 96–112)
Creatinine, Ser: 1.43 mg/dL — ABNORMAL HIGH (ref 0.50–1.10)
GFR calc Af Amer: 53 mL/min — ABNORMAL LOW (ref >90)
GFR calc Af Amer: 51 mL/min — ABNORMAL LOW (ref >90)
GFR calc Af Amer: 51 mL/min — ABNORMAL LOW (ref >90)
GFR calc non Af Amer: 46 mL/min — ABNORMAL LOW (ref >90)
GFR calc non Af Amer: 44 mL/min — ABNORMAL LOW (ref >90)
GFR calc non Af Amer: 44 mL/min — ABNORMAL LOW (ref >90)
GFR calc non Af Amer: 38 mL/min — ABNORMAL LOW (ref >90)
Glucose, Bld: 158 mg/dL — ABNORMAL HIGH (ref 70–99)
Glucose, Bld: 161 mg/dL — ABNORMAL HIGH (ref 70–99)
Potassium: 3.6 mEq/L (ref 3.5–5.1)
Potassium: 3.3 mEq/L — ABNORMAL LOW (ref 3.5–5.1)
Potassium: 3.3 mEq/L — ABNORMAL LOW (ref 3.5–5.1)
Sodium: 139 mEq/L (ref 135–145)
Sodium: 139 mEq/L (ref 135–145)
Sodium: 138 mEq/L (ref 135–145)
Sodium: 135 mEq/L (ref 135–145)
Sodium: 138 mEq/L (ref 135–145)

## 2012-07-05 LAB — GLUCOSE, CAPILLARY
Glucose-Capillary: 119 mg/dL — ABNORMAL HIGH (ref 70–99)
Glucose-Capillary: 157 mg/dL — ABNORMAL HIGH (ref 70–99)
Glucose-Capillary: 186 mg/dL — ABNORMAL HIGH (ref 70–99)
Glucose-Capillary: 187 mg/dL — ABNORMAL HIGH (ref 70–99)
Glucose-Capillary: 209 mg/dL — ABNORMAL HIGH (ref 70–99)
Glucose-Capillary: 224 mg/dL — ABNORMAL HIGH (ref 70–99)
Glucose-Capillary: 238 mg/dL — ABNORMAL HIGH (ref 70–99)
Glucose-Capillary: 256 mg/dL — ABNORMAL HIGH (ref 70–99)
Glucose-Capillary: 265 mg/dL — ABNORMAL HIGH (ref 70–99)
Glucose-Capillary: 302 mg/dL — ABNORMAL HIGH (ref 70–99)
Glucose-Capillary: 321 mg/dL — ABNORMAL HIGH (ref 70–99)
Glucose-Capillary: 340 mg/dL — ABNORMAL HIGH (ref 70–99)
Glucose-Capillary: 513 mg/dL — ABNORMAL HIGH (ref 70–99)

## 2012-07-05 LAB — URINALYSIS, ROUTINE W REFLEX MICROSCOPIC
Glucose, UA: >1000 mg/dL — AB
Hgb urine dipstick: NEGATIVE
Protein, ur: NEGATIVE mg/dL
Urobilinogen, UA: 0.2 mg/dL (ref 0.0–1.0)

## 2012-07-05 LAB — KETONES, URINE: Ketones, ur: 40 mg/dL — AB

## 2012-07-05 LAB — URINE MICROSCOPIC-ADD ON

## 2012-07-05 LAB — TROPONIN I
Troponin I: <0.3 ng/mL (ref <0.30)
Troponin I: <0.3 ng/mL (ref <0.30)

## 2012-07-05 LAB — MRSA PCR SCREENING: MRSA by PCR: NEGATIVE

## 2012-07-05 LAB — CBC
HCT: 36.2 % (ref 36.0–46.0)
Hemoglobin: 12.4 g/dL (ref 12.0–15.0)
MCHC: 34.3 g/dL (ref 30.0–36.0)
WBC: 10.5 10*3/uL (ref 4.0–10.5)

## 2012-07-05 LAB — HEMOGLOBIN A1C: Mean Plasma Glucose: 332 mg/dL — ABNORMAL HIGH (ref <117)

## 2012-07-05 MED ORDER — LIVING WELL WITH DIABETES BOOK
Freq: Once | Status: AC
  Administered 2012-07-05: 03:00:00
  Filled 2012-07-05: qty 1

## 2012-07-05 NOTE — ED Provider Notes (Signed)
I saw and evaluated the patient, reviewed the resident's note and I agree with the findings and plan. If applicable, I agree with the resident's interpretation of the EKG.  If applicable, I was present for critical portions of any procedures performed.  New onset DKA. 3 weeks of polyuria, polydypsia, dizziness.  Appears dry. CTAB, RRR, nonfocal neuro exam. Insulin gtt, IVF  CRITICAL CARE Performed by: Glynn Octave Total critical care time: 30 Critical care time was exclusive of separately billable procedures and treating other patients. Critical care was necessary to treat or prevent imminent or life-threatening deterioration. Critical care was time spent personally by me on the following activities: development of treatment plan with patient and/or surrogate as well as nursing, discussions with consultants, evaluation of patient's response to treatment, examination of patient, obtaining history from patient or surrogate, ordering and performing treatments and interventions, ordering and review of laboratory studies, ordering and review of radiographic studies, pulse oximetry and re-evaluation of patient's condition.   Glynn Octave, MD 07/05/12 [phone]

## 2012-07-05 NOTE — Progress Notes (Signed)
7/3  Spoke with patient about her diabetes.  States that she had been having polyuria and polydipsia for several weeks.  Presented to her PCP with elevated blood sugar.  She was unable to remember physician's name.  Seems to be in denial about having diabetes.  Is very thirsty at this time. Remains on glucostablizer at this time.  Will have staff RNs begin education: have patient watch DM videos [phone], teach patient to check own CBGs, give patient DM [place] notes before discharge. A prescription form for glucose meter and strips is placed on shadow chart for physician to sign for discharge.   Recommend the [employer] meter and strips for cost. Will have a dietician speak with patient about meal planning. Outpatient diabetes education recommended if patient willing to attend after discharge.  Does have Medicaid and Medicare. Will continue to follow while in hospital.  Smith Mince RN BSN CDE

## 2012-07-05 NOTE — Progress Notes (Signed)
Nutrition Brief Note  RD consulted for diet education regarding diabetes.  Patient essentially declined education stating her MD "wasn't sure if she had diabetes".  RD left "Carbohydrate Counting for People with Diabetes" handouts from the Academy of Nutrition and Dietetics.  Lab Results  Component Value Date   HGBA1C 13.2* 07/05/2012    Body mass index is 49.43 kg/(m^2). Patient meets criteria for Obesity Class III based on current BMI.   Current diet order is Clear Liquids.  Patient reported being hungry.  Labs and medications reviewed.   No nutrition interventions warranted at this time.  Please re-consult RD as needed or should patient have questions.  Maureen Chatters, RD, LDN Pager #: [phone] After-Hours Pager #: [phone]

## 2012-07-05 NOTE — Progress Notes (Signed)
Utilization Review Completed. 07/05/2012  

## 2012-07-05 NOTE — Progress Notes (Signed)
  Echocardiogram 2D Echocardiogram has been performed.  [name] FRANCES 07/05/2012, 4:39 PM 

## 2012-07-05 NOTE — Care Management Note (Signed)
  Page 1 of 1   07/05/2012     12:25:17 PM   CARE MANAGEMENT NOTE 07/05/2012  Patient:  [name], [name]   Account Number:  [id number]  Date Initiated:  07/05/2012  Documentation initiated by:  Alvira Philips Assessment:   62 yr-old female adm with dx of DKA; lives with son     DC Associate Professor  CM consult      Per UR Regulation:  Reviewed for med. necessity/level of care/duration of stay  Comments:  PCP:  Dr Fleet Contras with [facility]

## 2012-07-05 NOTE — Progress Notes (Addendum)
[facility] Progress Note [place] TEAM 1 - Stepdown/ICU TEAM   [name]:[id number] DOB: 18-Jan-1950 DOA: 07/04/2012 PCP: Default, Provider, MD  Brief narrative: 62 y.o. female with PMH significant for HTN who was referred for admission by her PCP because of hyperglycemia. Patient has had dizziness, polyuria, polydipsia, for last 5 weeks. No prior history of Diabetes. She related dry cough for the last 2 weeks.This was associated with decrease appetite. She vomited once 24 hours prior to admission. She has had abdominal pain for last 2 weeks intermittantly. She denied abdominal pain when evaluated in the ER. She has been sigificantly dizzy resulting in inability to ambulate. She denied chest pain, dyspnea, fevers, dysuria.    Assessment/Plan: Active Problems:   DKA (diabetic ketoacidoses) -gap slowly closing and weaning drip down -once gap closes can transition to Lantus or Levemir and advance diet -continue IVF's since was dehydrated prior to admission    Diabetes mellitus, new onset -Diabetes coordinator consulted -given presentation serum glucose 791 anticipate will initially require insulin at home but likely can transtion to Metformin (esp with obesity) -risk factor for CAD/vasc disease screening: EKG, FLP -HgbA1c 13.6    Acute renal failure on CKD (chronic kidney disease) stage 2, GFR 60-89 ml/min -due to dehydration from DKA but may hava a degree of diabetic nephropathy -baseline GFR 81 and Scr 0.88    HTN (hypertension) -was on prinizide pre admit -on hold since BP has been soft and ongoing DH -if EKG concerning for LVH or ischemic changes will need 2D ECHO this admit: ?? Septal and inferior infarcts so will check ECHO    Asthma, chronic -on inhalers pre admit but has not undergone PFT's - ?? Cough and dyspnea indicators of CHF?? -no cough now, leukocytosis or focal infiltrate so will dc anbx's    Morbid obesity with BMI of 45.0-49.9, adult   DVT  prophylaxis: SQ Heparin Code Status: Full Family Communication: Patient only at bedside Disposition Plan: Transfer to floor if able to dc insulin gtt Isolation: None Nutritional Status: Obese but has acute protein calorie malnutrition due to DKA, dehydration and inability to utilize carbohydrates  Consultants: None   Procedures: EKG ? Old septal and inferior infarcts 2D ECHO pending  Antibiotics: Zosyn 7/2 >>> 7/3 Zithromax 7/2 >>> 7/3  HPI/Subjective: Awake but seems mildly confused, no CP or cough now.   Objective: Blood pressure 103/82, pulse 85, temperature 98 F (36.7 C), temperature source Oral, resp. rate 18, height 5\' 7"  (1.702 m), weight 143.2 kg (315 lb 11.2 oz), SpO2 99.00%.  Intake/Output Summary (Last 24 hours) at 07/05/12 9562 Last data filed at 07/05/12 0700  Gross per 24 hour  Intake 1135.98 ml  Output    200 ml  Net 935.98 ml     Exam: General: No acute respiratory distress, obese Lungs: Clear to auscultation bilaterally without wheezes or crackles, RA Cardiovascular: Regular rate and rhythm without murmur gallop or rub normal S1 and S2, only trace peripheral edema at ankles without JVD Abdomen: Nontender, nondistended, soft, bowel sounds positive, no rebound, no ascites, no appreciable mass Musculoskeletal: No significant cyanosis, clubbing of bilateral lower extremities Neurological: Alert and oriented x 3, moves all extremities x 4 without focal neurological deficits, CN 2-12 intact  Scheduled Meds: Scheduled Meds: . azithromycin  500 mg Intravenous Q24H  . heparin  5,000 Units Subcutaneous Q8H  . mometasone-formoterol  2 puff Inhalation BID  . pantoprazole (PROTONIX) IV  40 mg Intravenous Q12H  . piperacillin-tazobactam (ZOSYN)  IV  3.375 g Intravenous Q8H   Continuous Infusions: . sodium chloride Stopped (07/05/12 0530)  . dextrose 5 % and 0.45% NaCl 100 mL/hr at 07/05/12 0507  . insulin (NOVOLIN-R) infusion 7.1 Units/hr (07/05/12 0934)     **Reviewed in detail by the Attending Physician   Data Reviewed: Basic Metabolic Panel:  Recent Labs Lab 07/04/12 1654 07/05/12 0015 07/05/12 0230 07/05/12 0420  NA 130* 139 139 138  K 4.4 3.6 3.3* 3.4*  CL 84* 98 101 102  CO2 19 25 23 21   GLUCOSE 791* 349* 309* 217*  BUN 44* 36* 36* 36*  CREATININE 1.44* 1.23* 1.27* 1.28*  CALCIUM 10.7* 9.5 9.2 9.3   Liver Function Tests:  Recent Labs Lab 07/04/12 1654  AST 18  ALT 25  ALKPHOS 146*  BILITOT 0.3  PROT 8.2  ALBUMIN 3.8   No results found for this basename: LIPASE, AMYLASE,  in the last 168 hours No results found for this basename: AMMONIA,  in the last 168 hours CBC:  Recent Labs Lab 07/04/12 1654 07/05/12 0015  WBC 9.7 10.5  HGB 13.9 12.4  HCT 42.0 36.2  MCV 79.2 77.4*  PLT 285 247   Cardiac Enzymes:  Recent Labs Lab 07/05/12 0015 07/05/12 0420  TROPONINI <0.30 <0.30   BNP (last 3 results)  Recent Labs  01/22/12 1240  PROBNP 435.3*   CBG:  Recent Labs Lab 07/05/12 0459 07/05/12 0600 07/05/12 0659 07/05/12 0805 07/05/12 0933  GLUCAP 209* 187* 167* 151* 161*    Recent Results (from the past 240 hour(s))  MRSA PCR SCREENING     Status: None   Collection Time    07/04/12 10:35 PM      Result Value Range Status   MRSA by PCR NEGATIVE  NEGATIVE Final   Comment:            The GeneXpert MRSA Assay (FDA     approved for NASAL specimens     only), is one component of a     comprehensive MRSA colonization     surveillance program. It is not     intended to diagnose MRSA     infection nor to guide or     monitor treatment for     MRSA infections.     Studies:  Recent x-ray studies have been reviewed in detail by the Attending Physician    Junious Silk, ANP [facility] Office  [phone] Pager [phone]  **If unable to reach the above provider after paging please contact the Flow Manager @ [phone]  On-Call/Text Page:      [email or web address]      password  [email or web address]  If 7PM-7AM, please contact night-coverage [email or web address] Password [facility] 07/05/2012, 9:52 AM   LOS: 1 day   I have examined the patient, reviewed the chart and modified the above note which I agree with.   Pax Reasoner,MD [phone] 07/05/2012, 11:59 AM

## 2012-07-06 DIAGNOSIS — I5022 Chronic systolic (congestive) heart failure: Secondary | ICD-10-CM | POA: Diagnosis present

## 2012-07-06 DIAGNOSIS — E119 Type 2 diabetes mellitus without complications: Secondary | ICD-10-CM

## 2012-07-06 LAB — BASIC METABOLIC PANEL
CO2: 24 mEq/L (ref 19–32)
Calcium: 8.7 mg/dL (ref 8.4–10.5)
Chloride: 101 mEq/L (ref 96–112)
Creatinine, Ser: 1.98 mg/dL — ABNORMAL HIGH (ref 0.50–1.10)
Glucose, Bld: 122 mg/dL — ABNORMAL HIGH (ref 70–99)

## 2012-07-06 LAB — URINE CULTURE

## 2012-07-06 LAB — GLUCOSE, CAPILLARY
Glucose-Capillary: 114 mg/dL — ABNORMAL HIGH (ref 70–99)
Glucose-Capillary: 117 mg/dL — ABNORMAL HIGH (ref 70–99)
Glucose-Capillary: 243 mg/dL — ABNORMAL HIGH (ref 70–99)
Glucose-Capillary: 465 mg/dL — ABNORMAL HIGH (ref 70–99)

## 2012-07-06 LAB — LIPID PANEL
Cholesterol: 183 mg/dL (ref 0–200)
Triglycerides: 249 mg/dL — ABNORMAL HIGH (ref <150)
VLDL: 50 mg/dL — ABNORMAL HIGH (ref 0–40)

## 2012-07-06 MED ORDER — PANTOPRAZOLE SODIUM 40 MG PO TBEC
40.0000 mg | DELAYED_RELEASE_TABLET | Freq: Two times a day (BID) | ORAL | Status: DC
  Administered 2012-07-06 – 2012-07-11 (×11): 40 mg via ORAL
  Filled 2012-07-06 (×11): qty 1

## 2012-07-06 MED ORDER — INSULIN ASPART 100 UNIT/ML ~~LOC~~ SOLN
15.0000 [IU] | Freq: Once | SUBCUTANEOUS | Status: AC
  Administered 2012-07-06: 15 [IU] via SUBCUTANEOUS

## 2012-07-06 MED ORDER — INSULIN ASPART 100 UNIT/ML ~~LOC~~ SOLN: 0.0000 [IU] | Freq: Three times a day (TID) | SUBCUTANEOUS | Status: DC

## 2012-07-06 MED ORDER — GLUCOSE 40 % PO GEL: 1.0000 | ORAL | Status: DC | PRN

## 2012-07-06 MED ORDER — POTASSIUM CHLORIDE CRYS ER 20 MEQ PO TBCR
20.0000 meq | EXTENDED_RELEASE_TABLET | ORAL | Status: AC
  Administered 2012-07-06 (×2): 20 meq via ORAL
  Filled 2012-07-06 (×2): qty 1

## 2012-07-06 MED ORDER — INSULIN GLARGINE 100 UNIT/ML ~~LOC~~ SOLN
18.0000 [IU] | Freq: Every day | SUBCUTANEOUS | Status: DC
  Administered 2012-07-06: 18 [IU] via SUBCUTANEOUS
  Filled 2012-07-06: qty 0.18

## 2012-07-06 MED ORDER — LISINOPRIL-HYDROCHLOROTHIAZIDE 20-12.5 MG PO TABS: 1.0000 | ORAL_TABLET | Freq: Every day | ORAL | Status: DC

## 2012-07-06 MED ORDER — INSULIN ASPART 100 UNIT/ML ~~LOC~~ SOLN
18.0000 [IU] | Freq: Once | SUBCUTANEOUS | Status: AC
  Administered 2012-07-06: 18 [IU] via SUBCUTANEOUS

## 2012-07-06 MED ORDER — INSULIN ASPART 100 UNIT/ML ~~LOC~~ SOLN: 0.0000 [IU] | Freq: Every day | SUBCUTANEOUS | Status: DC

## 2012-07-06 MED ORDER — DEXTROSE 50 % IV SOLN: 50.0000 mL | Freq: Once | INTRAVENOUS | Status: AC | PRN

## 2012-07-06 MED ORDER — POTASSIUM CHLORIDE CRYS ER 20 MEQ PO TBCR
40.0000 meq | EXTENDED_RELEASE_TABLET | Freq: Once | ORAL | Status: AC
  Administered 2012-07-06: 40 meq via ORAL
  Filled 2012-07-06: qty 2

## 2012-07-06 MED ORDER — INSULIN ASPART 100 UNIT/ML ~~LOC~~ SOLN
0.0000 [IU] | Freq: Three times a day (TID) | SUBCUTANEOUS | Status: DC
  Administered 2012-07-06: 5 [IU] via SUBCUTANEOUS

## 2012-07-06 MED ORDER — HYDROCHLOROTHIAZIDE 12.5 MG PO CAPS
12.5000 mg | ORAL_CAPSULE | Freq: Every day | ORAL | Status: DC
  Administered 2012-07-06: 12.5 mg via ORAL
  Filled 2012-07-06 (×3): qty 1

## 2012-07-06 MED ORDER — DEXTROSE 50 % IV SOLN: 25.0000 mL | Freq: Once | INTRAVENOUS | Status: AC | PRN

## 2012-07-06 MED ORDER — LISINOPRIL 20 MG PO TABS
20.0000 mg | ORAL_TABLET | Freq: Every day | ORAL | Status: DC
  Administered 2012-07-06 – 2012-07-11 (×6): 20 mg via ORAL
  Filled 2012-07-06 (×7): qty 1

## 2012-07-06 MED ORDER — INSULIN GLARGINE 100 UNIT/ML ~~LOC~~ SOLN
10.0000 [IU] | Freq: Every day | SUBCUTANEOUS | Status: DC
  Administered 2012-07-06: 10 [IU] via SUBCUTANEOUS
  Filled 2012-07-06 (×2): qty 0.1

## 2012-07-06 MED ORDER — ASPIRIN EC 81 MG PO TBEC
81.0000 mg | DELAYED_RELEASE_TABLET | Freq: Every day | ORAL | Status: DC
  Administered 2012-07-06 – 2012-07-11 (×6): 81 mg via ORAL
  Filled 2012-07-06 (×6): qty 1

## 2012-07-06 MED ORDER — INSULIN ASPART 100 UNIT/ML ~~LOC~~ SOLN
8.0000 [IU] | Freq: Once | SUBCUTANEOUS | Status: AC
  Administered 2012-07-06: 8 [IU] via SUBCUTANEOUS

## 2012-07-06 NOTE — Progress Notes (Signed)
Pt self administered insulin shot.

## 2012-07-06 NOTE — Progress Notes (Signed)
[facility] Progress Note [place] TEAM 1 - Stepdown/ICU TEAM   [name] ZOX:[id number] DOB: 11/16/1950 DOA: 07/04/2012 PCP: Default, Provider, MD  Brief narrative: 62 y.o. female with PMH significant for HTN who was referred for admission by her PCP because of hyperglycemia. Patient has had dizziness, polyuria, polydipsia, for last 5 weeks. No prior history of Diabetes. She related dry cough for the last 2 weeks.This was associated with decrease appetite. She vomited once 24 hours prior to admission. She has had abdominal pain for last 2 weeks intermittantly. She denied abdominal pain when evaluated in the ER. She has been sigificantly dizzy resulting in inability to ambulate. She denied chest pain, dyspnea, fevers, dysuria.    Assessment/Plan: Active Problems:   DKA (diabetic ketoacidoses) -gap closed- now on Lantus but need to increase dose as AM sugars still high -will likely need about 6-8 U of Novolog with meals as well - insulin teaching being done today    Diabetes mellitus, new onset -Diabetes coordinator consulted -risk factor for CAD/vasc disease screening: EKG, FLP -HgbA1c 13.6    Acute renal failure on CKD (chronic kidney disease) stage 2, GFR 60-89 ml/min -due to dehydration from DKA but may hava a degree of diabetic nephropathy -baseline GFR 81 and Scr 0.88    HTN (hypertension) -was on prinizide pre admit -on hold since BP has been soft and ongoing DH -if EKG concerning for LVH or ischemic changes will need 2D ECHO this admit:  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 -on inhalers pre admit but has not undergone PFT's -no cough now, leukocytosis or focal infiltrate so will dc anbx's    Morbid obesity with BMI of  45.0-49.9, adult - discussed in detail the need for weight loss   DVT prophylaxis: SQ Heparin Code Status: Full Family Communication: Patient only at bedside Disposition Plan: Transfer to floor  Isolation: None Nutritional Status: Obese but has acute protein calorie malnutrition due to DKA, dehydration and inability to utilize carbohydrates  Consultants: None   Procedures: EKG ? Old septal and inferior infarcts 2D ECHO pending  Antibiotics: Zosyn 7/2 >>> 7/3 Zithromax 7/2 >>> 7/3  HPI/Subjective: Awake and alert- no complaints of having chest pain with exertion. Explained ECHO findings and the fact that she has had a prior MI. Will need to start ASA 81 mg. Also explained that she will need to be on long term Insulin for severe diabetes (A1c> 13)   Objective: Blood pressure 130/77, pulse 96, temperature 98.6 F (37 C), temperature source Axillary, resp. rate 18, height 5\' 7"  (1.702 m), weight 143.2 kg (315 lb 11.2 oz), SpO2 99.00%.  Intake/Output Summary (Last 24 hours) at 07/06/12 1215 Last data filed at 07/06/12 0900  Gross per 24 hour  Intake   2310 ml  Output   1675 ml  Net    635 ml     Exam: General: No acute respiratory distress, obese Lungs: Clear to auscultation bilaterally without wheezes or crackles, RA Cardiovascular: Regular rate and rhythm without murmur gallop or rub normal S1 and S2, only trace peripheral edema at ankles without JVD Abdomen: Nontender, nondistended, soft, bowel sounds positive, no rebound, no ascites, no appreciable mass Musculoskeletal: No significant cyanosis, clubbing of  bilateral lower extremities Neurological: Alert and oriented x 3, moves all extremities x 4 without focal neurological deficits, CN 2-12 intact  Scheduled Meds: Scheduled Meds: . heparin  5,000 Units Subcutaneous Q8H  . insulin aspart  0-15 Units Subcutaneous TID WC  . insulin aspart  0-5 Units Subcutaneous QHS  . insulin glargine  18 Units Subcutaneous QHS  .  mometasone-formoterol  2 puff Inhalation BID  . pantoprazole  40 mg Oral BID   Continuous Infusions: . sodium chloride 125 mL/hr at 07/06/12 0224  . insulin (NOVOLIN-R) infusion 5.7 Units/hr (07/06/12 0050)    **Reviewed in detail by the Attending Physician   Data Reviewed: Basic Metabolic Panel:  Recent Labs Lab 07/05/12 0230 07/05/12 0420 07/05/12 0814 07/05/12 1349 07/06/12 0049  NA 139 138 135 138 135  K 3.3* 3.4* 3.7 3.3* 3.0*  CL 101 102 100 102 101  CO2 23 21 20 23 24   GLUCOSE 309* 217* 158* 161* 122*  BUN 36* 36* 34* 34* 32*  CREATININE 1.27* 1.28* 1.43* 1.73* 1.98*  CALCIUM 9.2 9.3 9.1 9.1 8.7   Liver Function Tests:  Recent Labs Lab 07/04/12 1654  AST 18  ALT 25  ALKPHOS 146*  BILITOT 0.3  PROT 8.2  ALBUMIN 3.8   No results found for this basename: LIPASE, AMYLASE,  in the last 168 hours No results found for this basename: AMMONIA,  in the last 168 hours CBC:  Recent Labs Lab 07/04/12 1654 07/05/12 0015  WBC 9.7 10.5  HGB 13.9 12.4  HCT 42.0 36.2  MCV 79.2 77.4*  PLT 285 247   Cardiac Enzymes:  Recent Labs Lab 07/05/12 0015 07/05/12 0420 07/05/12 0914  TROPONINI <0.30 <0.30 <0.30   BNP (last 3 results)  Recent Labs  01/22/12 1240  PROBNP 435.3*   CBG:  Recent Labs Lab 07/05/12 2233 07/05/12 2334 07/06/12 0049 07/06/12 0201 07/06/12 0737  GLUCAP 122* 119* 117* 114* 243*    Recent Results (from the past 240 hour(s))  CULTURE, BLOOD (ROUTINE X 2)     Status: None   Collection Time    07/04/12  8:40 PM      Result Value Range Status   Specimen Description BLOOD ARM LEFT   Final   Special Requests BOTTLES DRAWN AEROBIC ONLY 4CC   Final   Culture  Setup Time 07/05/2012 04:02   Final   Culture     Final   Value:        BLOOD CULTURE RECEIVED NO GROWTH TO DATE CULTURE WILL BE HELD FOR 5 DAYS BEFORE ISSUING A FINAL NEGATIVE REPORT   Report Status PENDING   Incomplete  MRSA PCR SCREENING     Status: None   Collection Time     07/04/12 10:35 PM      Result Value Range Status   MRSA by PCR NEGATIVE  NEGATIVE Final   Comment:            The GeneXpert MRSA Assay (FDA     approved for NASAL specimens     only), is one component of a     comprehensive MRSA colonization     surveillance program. It is not     intended to diagnose MRSA     infection nor to guide or     monitor treatment for     MRSA infections.  URINE CULTURE     Status: None   Collection Time    07/05/12 12:05 AM      Result Value  Range Status   Specimen Description URINE, CLEAN CATCH   Final   Special Requests NONE   Final   Culture  Setup Time 07/05/2012 00:57   Final   Colony Count 7,000 COLONIES/ML   Final   Culture INSIGNIFICANT GROWTH   Final   Report Status 07/06/2012 FINAL   Final  CULTURE, BLOOD (ROUTINE X 2)     Status: None   Collection Time    07/05/12 12:27 AM      Result Value Range Status   Specimen Description BLOOD LEFT ANTECUBITAL   Final   Special Requests BOTTLES DRAWN AEROBIC ONLY 10CC   Final   Culture  Setup Time 07/05/2012 09:40   Final   Culture     Final   Value:        BLOOD CULTURE RECEIVED NO GROWTH TO DATE CULTURE WILL BE HELD FOR 5 DAYS BEFORE ISSUING A FINAL NEGATIVE REPORT   Report Status PENDING   Incomplete     Studies:  Recent x-ray studies have been reviewed in detail by the Attending Physician   LOS: 2 days   Aengus Sauceda,MD [phone]  07/06/2012, 12:15 PM If 7PM-7AM, please contact night-coverage [email or web address] Password [email or web address]

## 2012-07-06 NOTE — Progress Notes (Signed)
CRITICAL VALUE ALERT  Critical value received:  CBG 476  Date of notification:  07/06/12  Time of notification:  1200  Critical value read back:yes  Nurse who received alert:  Lora Havens  MD notified (1st page):  Rizwan  Time of first page:  1201  MD notified (2nd page):  Time of second page:  Responding MD:  Butler Denmark  Time MD responded:  [phone]

## 2012-07-06 NOTE — Progress Notes (Signed)
CRITICAL VALUE ALERT  Critical value received: CBG-562 Date of notification:  07/06/2012  Time of notification:  1658  Critical value read back:yes  Nurse who received alert:  Augustin Schooling  MD notified (1st page):  1700  Time of first page:  1701  MD notified (2nd page):  Time of second page:  Responding MD:  Dr Hildred Priest  Time MD responded:  [phone]

## 2012-07-06 NOTE — Progress Notes (Signed)
1416:  Nurse called [facility] for report, report was given to [place].  Pt was transported alert and oriented without questions or concerns.  Pt belongings were carried with pt to new room.  Pt stated she would contact family/friends to inform of transfer.  1320:  Nurse attempted to call report to [facility] however nurse was unavailable, Diplomatic Services operational officer took nurse name and number for call back

## 2012-07-07 ENCOUNTER — Encounter (HOSPITAL_COMMUNITY): Payer: Self-pay | Admitting: Cardiology

## 2012-07-07 DIAGNOSIS — I5022 Chronic systolic (congestive) heart failure: Secondary | ICD-10-CM

## 2012-07-07 DIAGNOSIS — I509 Heart failure, unspecified: Secondary | ICD-10-CM

## 2012-07-07 LAB — BASIC METABOLIC PANEL
CO2: 22 mEq/L (ref 19–32)
Glucose, Bld: 377 mg/dL — ABNORMAL HIGH (ref 70–99)
Potassium: 3.9 mEq/L (ref 3.5–5.1)
Sodium: 133 mEq/L — ABNORMAL LOW (ref 135–145)

## 2012-07-07 LAB — GLUCOSE, CAPILLARY
Glucose-Capillary: 409 mg/dL — ABNORMAL HIGH (ref 70–99)
Glucose-Capillary: 205 mg/dL — ABNORMAL HIGH (ref 70–99)

## 2012-07-07 MED ORDER — INSULIN ASPART 100 UNIT/ML ~~LOC~~ SOLN
0.0000 [IU] | Freq: Three times a day (TID) | SUBCUTANEOUS | Status: DC
  Administered 2012-07-07 (×2): 20 [IU] via SUBCUTANEOUS
  Administered 2012-07-07: 11 [IU] via SUBCUTANEOUS
  Administered 2012-07-08: 15 [IU] via SUBCUTANEOUS
  Administered 2012-07-08: 11 [IU] via SUBCUTANEOUS
  Administered 2012-07-08: 15 [IU] via SUBCUTANEOUS
  Administered 2012-07-09: 11 [IU] via SUBCUTANEOUS
  Administered 2012-07-09: 7 [IU] via SUBCUTANEOUS
  Administered 2012-07-09: 11 [IU] via SUBCUTANEOUS
  Administered 2012-07-10 (×3): 7 [IU] via SUBCUTANEOUS
  Administered 2012-07-11: 11 [IU] via SUBCUTANEOUS
  Administered 2012-07-11: 4 [IU] via SUBCUTANEOUS

## 2012-07-07 MED ORDER — AMLODIPINE BESYLATE 5 MG PO TABS
5.0000 mg | ORAL_TABLET | Freq: Every day | ORAL | Status: DC
  Filled 2012-07-07: qty 1

## 2012-07-07 MED ORDER — INSULIN PEN STARTER KIT
1.0000 | Freq: Once | Status: DC
  Filled 2012-07-07: qty 1

## 2012-07-07 MED ORDER — SODIUM CHLORIDE 0.9 % IJ SOLN
3.0000 mL | Freq: Two times a day (BID) | INTRAMUSCULAR | Status: DC
  Administered 2012-07-08 – 2012-07-09 (×3): 3 mL via INTRAVENOUS

## 2012-07-07 MED ORDER — INSULIN ASPART 100 UNIT/ML ~~LOC~~ SOLN
10.0000 [IU] | Freq: Three times a day (TID) | SUBCUTANEOUS | Status: DC
  Administered 2012-07-07 (×3): 10 [IU] via SUBCUTANEOUS

## 2012-07-07 MED ORDER — INSULIN GLARGINE 100 UNIT/ML ~~LOC~~ SOLN
30.0000 [IU] | Freq: Every day | SUBCUTANEOUS | Status: DC
  Administered 2012-07-07: 30 [IU] via SUBCUTANEOUS
  Filled 2012-07-07: qty 0.3

## 2012-07-07 NOTE — Progress Notes (Signed)
PATIENT DETAILS Name: [name] Age: 62 y.o. Sex: female Date of Birth: 12/18/1950 Admit Date: 07/04/2012 Admitting Physician Alba Cory, MD [id number], Provider, MD  Subjective: Doing better-but claims she is SOB upon ambulating-this is worse than her usual baseline.Apparently she went to see her PCP for this same issue a few months back-and was prescribed a Albuterol inhaler  Assessment/Plan: Active Problems:   DKA (diabetic ketoacidoses) -resolved -on admission, required Insulin gtt-now transitioned to Lantus/Novolg regimen     Diabetes mellitus, new onset -A1C 13.2 -CBG's still uncontrolled -Increase Lantus to 30 units QHS, start Novolog 10 units with meals -diabetic education, insulin teaching to be commenced    Acute renal failure -likely pre-renal -stop HCTZ for now, c/w ACEI cautiously -monitor lytes  New onset Systolic Heart Failure -given obesity-hard to assess vol status-but no overtly overloaded and fairly well compensated -she does give a hx of exertional dyspnea-that is different from her usual baseline-given the fact that she has new wall motion changes in her Echo (prior Echo in 2009-did not show the changes)-have consulted cardiology for further assistance and evaluation -check echo, cardiac enzymes negative  -c/w ASA    HTN (hypertension) -c/w Lisinopril cautiously for now-will add a beta blocker in the next few days if BP can tolerate    Morbid obesity with BMI of 45.0-49.9, adult -counseled regarding weight loss   CKD (chronic kidney disease) stage 2, GFR 60-89 ml/min -hopefully with further tight glycemic control, her creatinine will slowly continue to improve  Disposition: Remain inpatient  DVT Prophylaxis: Prophylactic  Heparin   Code Status: Full code  Family Communication None at bedside  Procedures:  None  CONSULTS:  cardiology   MEDICATIONS: Scheduled Meds: . amLODipine  5 mg Oral Daily  . aspirin EC  81 mg  Oral Daily  . heparin  5,000 Units Subcutaneous Q8H  . insulin aspart  0-20 Units Subcutaneous TID WC  . insulin aspart  10 Units Subcutaneous TID WC  . insulin glargine  30 Units Subcutaneous QHS  . lisinopril  20 mg Oral Daily  . mometasone-formoterol  2 puff Inhalation BID  . pantoprazole  40 mg Oral BID   Continuous Infusions: . insulin (NOVOLIN-R) infusion 5.7 Units/hr (07/06/12 0050)   PRN Meds:.albuterol, dextrose, dextrose  Antibiotics: Anti-infectives   Start     Dose/Rate Route Frequency Ordered Stop   07/05/12 0200  piperacillin-tazobactam (ZOSYN) IVPB 3.375 g  Status:  Discontinued     3.375 g 12.5 mL/hr over 240 Minutes Intravenous Every 8 hours 07/04/12 1955 07/05/12 1002   07/04/12 2000  azithromycin (ZITHROMAX) 500 mg in dextrose 5 % 250 mL IVPB  Status:  Discontinued     500 mg 250 mL/hr over 60 Minutes Intravenous Every 24 hours 07/04/12 1946 07/05/12 1002   07/04/12 2000  piperacillin-tazobactam (ZOSYN) IVPB 3.375 g     3.375 g 100 mL/hr over 30 Minutes Intravenous  Once 07/04/12 1955 07/04/12 2155       PHYSICAL EXAM: Vital signs in last 24 hours: Filed Vitals:   07/06/12 1509 07/06/12 2042 07/06/12 2131 07/07/12 0415  BP: 119/80  122/74 122/63  Pulse: 93 89 80 75  Temp: 97.4 F (36.3 C)  98.1 F (36.7 C) 97.7 F (36.5 C)  TempSrc: Oral  Oral Oral  Resp: 18 18 18 20   Height: 5\' 7"  (1.702 m)     Weight: 149.2 kg (328 lb 14.8 oz)     SpO2: 97% 96% 98% 98%    Weight  change:  Filed Weights   07/04/12 2215 07/05/12 0500 07/06/12 1509  Weight: 143.2 kg (315 lb 11.2 oz) 143.2 kg (315 lb 11.2 oz) 149.2 kg (328 lb 14.8 oz)   Body mass index is 51.51 kg/(m^2).   Gen Exam: Awake and alert with clear speech.   Neck: Supple, No JVD.   Chest: B/L Clear.   CVS: S1 S2 Regular, no murmurs.  Abdomen: soft, BS +, non tender, non distended.  Extremities: no edema, lower extremities warm to touch. Neurologic: Non Focal.   Skin: No Rash.   Wounds: N/A.     Intake/Output from previous day:  Intake/Output Summary (Last 24 hours) at 07/07/12 0900 Last data filed at 07/06/12 2000  Gross per 24 hour  Intake    722 ml  Output      0 ml  Net    722 ml     LAB RESULTS: CBC  Recent Labs Lab 07/04/12 1654 07/05/12 0015  WBC 9.7 10.5  HGB 13.9 12.4  HCT 42.0 36.2  PLT 285 247  MCV 79.2 77.4*  MCH 26.2 26.5  MCHC 33.1 34.3  RDW 16.4* 16.2*    Chemistries   Recent Labs Lab 07/05/12 0420 07/05/12 0814 07/05/12 1349 07/06/12 0049 07/07/12 0400  NA 138 135 138 135 133*  K 3.4* 3.7 3.3* 3.0* 3.9  CL 102 100 102 101 102  CO2 21 20 23 24 22   GLUCOSE 217* 158* 161* 122* 377*  BUN 36* 34* 34* 32* 29*  CREATININE 1.28* 1.43* 1.73* 1.98* 1.76*  CALCIUM 9.3 9.1 9.1 8.7 8.9    CBG:  Recent Labs Lab 07/06/12 1649 07/06/12 1650 07/06/12 1801 07/06/12 2138 07/07/12 0759  GLUCAP 505* 562* 465* 409* 363*    GFR Estimated Creatinine Clearance: 50.5 ml/min (by C-G formula based on Cr of 1.76).  Coagulation profile No results found for this basename: INR, PROTIME,  in the last 168 hours  Cardiac Enzymes  Recent Labs Lab 07/05/12 0015 07/05/12 0420 07/05/12 0914  TROPONINI <0.30 <0.30 <0.30    No components found with this basename: POCBNP,  No results found for this basename: DDIMER,  in the last 72 hours  Recent Labs  07/05/12 0015  HGBA1C 13.2*    Recent Labs  07/06/12 0535  CHOL 183  HDL 57  LDLCALC 76  TRIG 249*  CHOLHDL 3.2   No results found for this basename: TSH, T4TOTAL, FREET3, T3FREE, THYROIDAB,  in the last 72 hours No results found for this basename: VITAMINB12, FOLATE, FERRITIN, TIBC, IRON, RETICCTPCT,  in the last 72 hours No results found for this basename: LIPASE, AMYLASE,  in the last 72 hours  Urine Studies No results found for this basename: UACOL, UAPR, USPG, UPH, UTP, UGL, UKET, UBIL, UHGB, UNIT, UROB, ULEU, UEPI, UWBC, URBC, UBAC, CAST, CRYS, UCOM, BILUA,  in the last 72  hours  MICROBIOLOGY: Recent Results (from the past 240 hour(s))  CULTURE, BLOOD (ROUTINE X 2)     Status: None   Collection Time    07/04/12  8:40 PM      Result Value Range Status   Specimen Description BLOOD ARM LEFT   Final   Special Requests BOTTLES DRAWN AEROBIC ONLY 4CC   Final   Culture  Setup Time 07/05/2012 04:02   Final   Culture     Final   Value:        BLOOD CULTURE RECEIVED NO GROWTH TO DATE CULTURE WILL BE HELD FOR 5 DAYS BEFORE ISSUING  A FINAL NEGATIVE REPORT   Report Status PENDING   Incomplete  MRSA PCR SCREENING     Status: None   Collection Time    07/04/12 10:35 PM      Result Value Range Status   MRSA by PCR NEGATIVE  NEGATIVE Final   Comment:            The GeneXpert MRSA Assay (FDA     approved for NASAL specimens     only), is one component of a     comprehensive MRSA colonization     surveillance program. It is not     intended to diagnose MRSA     infection nor to guide or     monitor treatment for     MRSA infections.  URINE CULTURE     Status: None   Collection Time    07/05/12 12:05 AM      Result Value Range Status   Specimen Description URINE, CLEAN CATCH   Final   Special Requests NONE   Final   Culture  Setup Time 07/05/2012 00:57   Final   Colony Count 7,000 COLONIES/ML   Final   Culture INSIGNIFICANT GROWTH   Final   Report Status 07/06/2012 FINAL   Final  CULTURE, BLOOD (ROUTINE X 2)     Status: None   Collection Time    07/05/12 12:27 AM      Result Value Range Status   Specimen Description BLOOD LEFT ANTECUBITAL   Final   Special Requests BOTTLES DRAWN AEROBIC ONLY 10CC   Final   Culture  Setup Time 07/05/2012 09:40   Final   Culture     Final   Value:        BLOOD CULTURE RECEIVED NO GROWTH TO DATE CULTURE WILL BE HELD FOR 5 DAYS BEFORE ISSUING A FINAL NEGATIVE REPORT   Report Status PENDING   Incomplete    RADIOLOGY STUDIES/RESULTS: Dg Chest 2 View  07/04/2012   *RADIOLOGY REPORT*  Clinical Data: Dizziness.  Hyperglycemia.   CHEST - 2 VIEW  Comparison: PA and lateral chest 01/22/2012.  Findings: Lungs are clear.  Heart size is upper normal.  No pneumothorax or pleural fluid.  IMPRESSION: No acute disease.   Original Report Authenticated By: Holley Dexter, M.D.    Jeoffrey Massed, MD  [facility] Pager:[phone]  If 7PM-7AM, please contact night-coverage [email or web address] Password [email or web address] 07/07/2012, 9:00 AM   LOS: 3 days

## 2012-07-07 NOTE — Progress Notes (Signed)
Pt watched diabetes videos 501 through 510. Was given insulin starter kit with booklet on insulin pen instructions in box for use at home. Gave pt multiple printouts on diabetes, sick days, foot care, carb counting and diet. Peter Congo RN

## 2012-07-07 NOTE — Consult Note (Addendum)
Cardiology Consult Note  Admit date: 07/04/2012 Name: [name] 62 y.o.  female DOB:  [birth year] MRN:  [id number]  Today's date:  07/07/2012  Referring Physician:   Dr. Dina Rich  Primary Physician:    Dr. Shelby Mattocks  Reason for Consultation:   Shortness of breath, abnormal echocardiogram  IMPRESSIONS: 1. Possible silent anteroseptal myocardial infarction 2. Prior dyspnea likely multifactorial 3. Recent diagnosis of diabetes with diabetic ketoacidosis 4. Hypertensive heart disease 5. Severe morbid obesity 6. Hirsutism 7. History of medical noncompliance 8. Chronic asthma  RECOMMENDATION: She has no symptoms of chest discomfort but does have dyspnea which is probably multifactorial. Her BNP is really not that elevated. She does have wall motion abnormality on echocardiogram. Her renal function and morbid obesity place her at increased risk of complications from catheterization.  I would start by risk stratification with a 2 day Lexiscan myocardial perfusion study. She obviously needs good diabetic control. Need to watch renal function with initiation of an ACE inhibitor and she also will need beta blockers as well as lipid lowering therapy.  HISTORY I was asked to see this 62 year old female for evaluation of dyspnea and an abnormal echocardiogram. The patient has a prior history of hypertensive heart disease but has been noncompliant with taking medications. She recently obtained a new primary care physician and hypertensive medicines are reinitiated. She also has significant chronic shortness of breath and takes inhalers. She was admitted to the hospital with significant diabetic ketoacidosis. She has improved and states that her dyspnea is improved. An echocardiogram showed some reduction in her left ventricular ejection fraction in what was felt to be a new wall motion abnormality in the anteroseptal region. She denies chest pain consistent with angina. She is inactive. She has  been on disability from work but is no longer hurting in her knees. She does have a history of hirsutism and has shaved for many years. She denies PND orthopnea and does not have syncope or palpitations.  Past Medical History  Diagnosis Date  . Osteoarthritis   . Hypertension   . Morbid obesity       Past Surgical History  Procedure Laterality Date  . Cesarean section       Allergies:  has No Known Allergies.   Medications: Prior to Admission medications   Medication Sig Start Date End Date Taking? Authorizing Provider  albuterol (PROVENTIL HFA;VENTOLIN HFA) 108 (90 BASE) MCG/ACT inhaler Inhale 1-2 puffs into the lungs every 6 (six) hours as needed for wheezing. 01/22/12  Yes Nelia Shi, MD  amLODipine (NORVASC) 5 MG tablet Take 5 mg by mouth daily.   Yes Historical Provider, MD  Fluticasone-Salmeterol (ADVAIR) 250-50 MCG/DOSE AEPB Inhale 1 puff into the lungs every 12 (twelve) hours.   Yes Historical Provider, MD  lisinopril-hydrochlorothiazide (PRINZIDE,ZESTORETIC) 20-12.5 MG per tablet Take 1 tablet by mouth daily.   Yes Historical Provider, MD  Multiple Vitamin (MULTIVITAMIN WITH MINERALS) TABS Take 1 tablet by mouth daily.   Yes Historical Provider, MD  OVER THE COUNTER MEDICATION Take 300 mg by mouth daily. Omega xl proprietary blend: Hospital doctor (Perna canaliculus), oil extract (PCSO-524) containing Omega Fatty Acids, Extra Virgin Olive Oil, d-alpha Tocopherol (Vitamin E) 300 mg per  2 capsules   Yes Historical Provider, MD    Family History: Family Status  Relation Status Death Age  . Father Deceased 20    died in [place] War  . Mother Alive     hypertension, CAD, stent  . Sister Alive   .  Maternal Grandmother      history of CAD, CABG    Social History:   reports that she has never smoked. She does not have any smokeless tobacco history on file. She reports that she does not drink alcohol or use illicit drugs.   History   Social History Narrative    Moved here from [place].  Disabled. Formerly worked at [employer] center.     Review of Systems: She has been morbidly obese for many years. She got pregnant fairly late in life. She complained of blurred vision. She has also had some diarrhea and is also had intermittent dyspepsia. She had frequency of urination recently. He one time she had severe knee arthritis and was told that she might need to have an operation but states that her knees got better since then. 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 She has had significant facial hair growth but has never had evaluation for this and has shaved since she was in her early 22s.  Other than as noted above the remainder of the review of systems is unremarkable.  Physical Exam: BP 129/75  Pulse 90  Temp(Src) 97.5 F (36.4 C) (Oral)  Resp 20  Ht 5\' 7"  (1.702 m)  Wt 149.2 kg (328 lb 14.8 oz)  BMI 51.51 kg/m2  SpO2 98%  General appearance: Very large obese black female who is currently in no acute distress Head: Normocephalic, without obvious abnormality, atraumatic Eyes: conjunctivae/corneas clear. PERRL, EOM's intact. Fundi benign. Neck: no adenopathy, no carotid bruit, no JVD, supple, symmetrical, trachea midline and Neck is thick Lungs: clear to auscultation bilaterally Heart: regular rate and rhythm, S1, S2 normal, no murmur, click, rub or gallop Abdomen: Very large and obese, no gross masses or tenderness noted Pelvic: deferred Extremities: 1+ peripheral edema, somewhat flat feet Pulses: Femoral pulses are very deep, dorsalis pedis pulses are 1+ bilaterally posterior tibials difficult to feel Skin: Hirsutism noted on face Neurologic: Grossly normal   Labs: CBC  Recent Labs  07/05/12 0015  WBC 10.5  RBC 4.68  HGB 12.4  HCT 36.2  PLT 247  MCV 77.4*  MCH 26.5  MCHC 34.3   RDW 16.2*   CMP   Recent Labs  07/04/12 1654  07/07/12 0400  NA 130*  < > 133*  K 4.4  < > 3.9  CL 84*  < > 102  CO2 19  < > 22  GLUCOSE 791*  < > 377*  BUN 44*  < > 29*  CREATININE 1.44*  < > 1.76*  CALCIUM 10.7*  < > 8.9  PROT 8.2  --   --   ALBUMIN 3.8  --   --   AST 18  --   --   ALT 25  --   --   ALKPHOS 146*  --   --   BILITOT 0.3  --   --   GFRNONAA 38*  < > 30*  GFRAA 44*  < > 35*  < > = values in this interval not displayed. BNP (last 3 results)  Recent Labs  01/22/12 1240 07/07/12 0909  PROBNP 435.3* 269.9*   Cardiac Panel (last 3 results)  Recent Labs  07/05/12 0015 07/05/12 0420 07/05/12 0914  TROPONINI <0.30 <0.30 <0.30     Radiology: Heart size upper normal, lungs clear  EKG: Left axis deviation,  poor R wave progression, possible old anteroseptal infarction  Signed:  W. Ashley Royalty MD [facility]   Cardiology Consultant  07/07/2012, 11:10 AM

## 2012-07-07 NOTE — Plan of Care (Signed)
Problem: Food- and Nutrition-Related Knowledge Deficit (NB-1.1) Goal: Nutrition education Formal process to instruct or train a patient/client in a skill or to impart knowledge to help patients/clients voluntarily manage or modify food choices and eating behavior to maintain or improve health. Outcome: Completed/Met Date Met:  07/07/12  RD consulted for nutrition education regarding diabetes.     Lab Results  Component Value Date    HGBA1C 13.2* 07/05/2012    RD provided "Carbohydrate Counting for People with Diabetes" handout from the Academy of [employer]. Discussed different food groups and their effects on blood sugar, emphasizing carbohydrate-containing foods. Provided list of carbohydrates and recommended serving sizes of common foods.  Discussed importance of controlled and consistent carbohydrate intake throughout the day. Provided examples of ways to balance meals/snacks and encouraged intake of high-fiber, whole grain complex carbohydrates. Teach back method used.  Expect fair compliance.  Body mass index is 51.51 kg/(m^2). Pt meets criteria for Obese Class III based on current BMI.  Current diet order is Carbohydrate Modified Medium, patient is consuming approximately 100% of meals at this time. Labs and medications reviewed. No further nutrition interventions warranted at this time. RD contact information provided. If additional nutrition issues arise, please re-consult RD.  Jarold Motto MS, RD, LDN Pager: [phone] After-hours pager: [phone]

## 2012-07-08 ENCOUNTER — Inpatient Hospital Stay (HOSPITAL_COMMUNITY): Payer: Medicare Other

## 2012-07-08 LAB — BASIC METABOLIC PANEL
BUN: 20 mg/dL (ref 6–23)
Chloride: 102 mEq/L (ref 96–112)
GFR calc Af Amer: 52 mL/min — ABNORMAL LOW (ref >90)
Glucose, Bld: 241 mg/dL — ABNORMAL HIGH (ref 70–99)
Potassium: 3.7 mEq/L (ref 3.5–5.1)
Sodium: 133 mEq/L — ABNORMAL LOW (ref 135–145)

## 2012-07-08 LAB — GLUCOSE, CAPILLARY: Glucose-Capillary: 307 mg/dL — ABNORMAL HIGH (ref 70–99)

## 2012-07-08 MED ORDER — TECHNETIUM TC 99M SESTAMIBI - CARDIOLITE
30.0000 | Freq: Once | INTRAVENOUS | Status: AC | PRN
  Administered 2012-07-08: 30 via INTRAVENOUS

## 2012-07-08 MED ORDER — INSULIN ASPART 100 UNIT/ML ~~LOC~~ SOLN
14.0000 [IU] | Freq: Three times a day (TID) | SUBCUTANEOUS | Status: DC
  Administered 2012-07-08 – 2012-07-09 (×4): 14 [IU] via SUBCUTANEOUS

## 2012-07-08 MED ORDER — ALUM & MAG HYDROXIDE-SIMETH 200-200-20 MG/5ML PO SUSP
30.0000 mL | ORAL | Status: DC | PRN
  Administered 2012-07-08 – 2012-07-10 (×2): 30 mL via ORAL
  Filled 2012-07-08 (×2): qty 30

## 2012-07-08 MED ORDER — INSULIN GLARGINE 100 UNIT/ML ~~LOC~~ SOLN
36.0000 [IU] | Freq: Every day | SUBCUTANEOUS | Status: DC
  Administered 2012-07-08: 36 [IU] via SUBCUTANEOUS
  Filled 2012-07-08 (×2): qty 0.36

## 2012-07-08 NOTE — Progress Notes (Signed)
PATIENT DETAILS Name: [name] Age: 62 y.o. Sex: female Date of Birth: 06/19/1950 Admit Date: 07/04/2012 Admitting Physician Alba Cory, MD [id number], Provider, MD  Subjective: No major issues overnight  Assessment/Plan: Active Problems:   DKA (diabetic ketoacidoses) -resolved -on admission, required Insulin gtt-now transitioned to Lantus/Novolg regimen     Diabetes mellitus, new onset -A1C 13.2 -CBG's still uncontrolled-but better than last few days -Increase Lantus to 36 units QHS, increase Novolog 14 units with meals -diabetic education, insulin teaching to be commenced    Acute renal failure -likely pre-renal -stop HCTZ for now, c/w ACEI cautiously -monitor lytes  New onset Systolic Heart Failure -given obesity-hard to assess vol status-but no overtly overloaded and fairly well compensated -she does give a hx of exertional dyspnea-that is different from her usual baseline-given the fact that she has new wall motion changes in her Echo (prior Echo in 2009-did not show the changes)-consulted cardiology -undergoing 2 day stress test  -c/w ASA    HTN (hypertension) -c/w Lisinopril cautiously for now-will add a beta blocker in the next few days if BP can tolerate    Morbid obesity with BMI of 45.0-49.9, adult -counseled regarding weight loss   CKD (chronic kidney disease) stage 2, GFR 60-89 ml/min -hopefully with further tight glycemic control, creatinine slowly improving  Disposition: Remain inpatient  DVT Prophylaxis: Prophylactic  Heparin   Code Status: Full code  Family Communication None at bedside  Procedures:  None  CONSULTS:  cardiology   MEDICATIONS: Scheduled Meds: . aspirin EC  81 mg Oral Daily  . heparin  5,000 Units Subcutaneous Q8H  . insulin aspart  0-20 Units Subcutaneous TID WC  . insulin aspart  14 Units Subcutaneous TID WC  . insulin glargine  36 Units Subcutaneous QHS  . Insulin Pen Starter Kit  1 kit Other Once   . lisinopril  20 mg Oral Daily  . mometasone-formoterol  2 puff Inhalation BID  . pantoprazole  40 mg Oral BID  . sodium chloride  3 mL Intravenous Q12H  . sodium chloride  3 mL Intravenous Q12H   Continuous Infusions: . insulin (NOVOLIN-R) infusion 5.7 Units/hr (07/06/12 0050)   PRN Meds:.albuterol, dextrose, dextrose  Antibiotics: Anti-infectives   Start     Dose/Rate Route Frequency Ordered Stop   07/05/12 0200  piperacillin-tazobactam (ZOSYN) IVPB 3.375 g  Status:  Discontinued     3.375 g 12.5 mL/hr over 240 Minutes Intravenous Every 8 hours 07/04/12 1955 07/05/12 1002   07/04/12 2000  azithromycin (ZITHROMAX) 500 mg in dextrose 5 % 250 mL IVPB  Status:  Discontinued     500 mg 250 mL/hr over 60 Minutes Intravenous Every 24 hours 07/04/12 1946 07/05/12 1002   07/04/12 2000  piperacillin-tazobactam (ZOSYN) IVPB 3.375 g     3.375 g 100 mL/hr over 30 Minutes Intravenous  Once 07/04/12 1955 07/04/12 2155       PHYSICAL EXAM: Vital signs in last 24 hours: Filed Vitals:   07/07/12 2133 07/08/12 0200 07/08/12 0500 07/08/12 0823  BP: 109/84 116/74 132/63   Pulse: 82 87 87   Temp: 97.6 F (36.4 C) 98.1 F (36.7 C) 98.6 F (37 C)   TempSrc: Oral Oral Oral   Resp: 20 18 18    Height:      Weight:      SpO2: 97% 97% 96% 96%    Weight change:  Filed Weights   07/04/12 2215 07/05/12 0500 07/06/12 1509  Weight: 143.2 kg (315 lb 11.2 oz) 143.2  kg (315 lb 11.2 oz) 149.2 kg (328 lb 14.8 oz)   Body mass index is 51.51 kg/(m^2).   Gen Exam: Awake and alert with clear speech.   Neck: Supple, No JVD.   Chest: B/L Clear.   CVS: S1 S2 Regular, no murmurs.  Abdomen: soft, BS +, non tender, non distended.  Extremities: no edema, lower extremities warm to touch. Neurologic: Non Focal.   Skin: No Rash.   Wounds: N/A.    Intake/Output from previous day: No intake or output data in the 24 hours ending 07/08/12 1306   LAB RESULTS: CBC  Recent Labs Lab 07/04/12 1654  07/05/12 0015  WBC 9.7 10.5  HGB 13.9 12.4  HCT 42.0 36.2  PLT 285 247  MCV 79.2 77.4*  MCH 26.2 26.5  MCHC 33.1 34.3  RDW 16.4* 16.2*    Chemistries   Recent Labs Lab 07/05/12 0814 07/05/12 1349 07/06/12 0049 07/07/12 0400 07/08/12 0410  NA 135 138 135 133* 133*  K 3.7 3.3* 3.0* 3.9 3.7  CL 100 102 101 102 102  CO2 20 23 24 22 20   GLUCOSE 158* 161* 122* 377* 241*  BUN 34* 34* 32* 29* 20  CREATININE 1.43* 1.73* 1.98* 1.76* 1.25*  CALCIUM 9.1 9.1 8.7 8.9 9.2    CBG:  Recent Labs Lab 07/07/12 1207 07/07/12 1621 07/07/12 2215 07/08/12 0744 07/08/12 1219  GLUCAP 366* 289* 205* 307* 301*    GFR Estimated Creatinine Clearance: 71.2 ml/min (by C-G formula based on Cr of 1.25).  Coagulation profile No results found for this basename: INR, PROTIME,  in the last 168 hours  Cardiac Enzymes  Recent Labs Lab 07/05/12 0015 07/05/12 0420 07/05/12 0914  TROPONINI <0.30 <0.30 <0.30    No components found with this basename: POCBNP,  No results found for this basename: DDIMER,  in the last 72 hours No results found for this basename: HGBA1C,  in the last 72 hours  Recent Labs  07/06/12 0535  CHOL 183  HDL 57  LDLCALC 76  TRIG 249*  CHOLHDL 3.2   No results found for this basename: TSH, T4TOTAL, FREET3, T3FREE, THYROIDAB,  in the last 72 hours No results found for this basename: VITAMINB12, FOLATE, FERRITIN, TIBC, IRON, RETICCTPCT,  in the last 72 hours No results found for this basename: LIPASE, AMYLASE,  in the last 72 hours  Urine Studies No results found for this basename: UACOL, UAPR, USPG, UPH, UTP, UGL, UKET, UBIL, UHGB, UNIT, UROB, ULEU, UEPI, UWBC, URBC, UBAC, CAST, CRYS, UCOM, BILUA,  in the last 72 hours  MICROBIOLOGY: Recent Results (from the past 240 hour(s))  CULTURE, BLOOD (ROUTINE X 2)     Status: None   Collection Time    07/04/12  8:40 PM      Result Value Range Status   Specimen Description BLOOD ARM LEFT   Final   Special Requests  BOTTLES DRAWN AEROBIC ONLY 4CC   Final   Culture  Setup Time 07/05/2012 04:02   Final   Culture     Final   Value:        BLOOD CULTURE RECEIVED NO GROWTH TO DATE CULTURE WILL BE HELD FOR 5 DAYS BEFORE ISSUING A FINAL NEGATIVE REPORT   Report Status PENDING   Incomplete  MRSA PCR SCREENING     Status: None   Collection Time    07/04/12 10:35 PM      Result Value Range Status   MRSA by PCR NEGATIVE  NEGATIVE Final  Comment:            The GeneXpert MRSA Assay (FDA     approved for NASAL specimens     only), is one component of a     comprehensive MRSA colonization     surveillance program. It is not     intended to diagnose MRSA     infection nor to guide or     monitor treatment for     MRSA infections.  URINE CULTURE     Status: None   Collection Time    07/05/12 12:05 AM      Result Value Range Status   Specimen Description URINE, CLEAN CATCH   Final   Special Requests NONE   Final   Culture  Setup Time 07/05/2012 00:57   Final   Colony Count 7,000 COLONIES/ML   Final   Culture INSIGNIFICANT GROWTH   Final   Report Status 07/06/2012 FINAL   Final  CULTURE, BLOOD (ROUTINE X 2)     Status: None   Collection Time    07/05/12 12:27 AM      Result Value Range Status   Specimen Description BLOOD LEFT ANTECUBITAL   Final   Special Requests BOTTLES DRAWN AEROBIC ONLY 10CC   Final   Culture  Setup Time 07/05/2012 09:40   Final   Culture     Final   Value:        BLOOD CULTURE RECEIVED NO GROWTH TO DATE CULTURE WILL BE HELD FOR 5 DAYS BEFORE ISSUING A FINAL NEGATIVE REPORT   Report Status PENDING   Incomplete    RADIOLOGY STUDIES/RESULTS: Dg Chest 2 View  07/04/2012   *RADIOLOGY REPORT*  Clinical Data: Dizziness.  Hyperglycemia.  CHEST - 2 VIEW  Comparison: PA and lateral chest 01/22/2012.  Findings: Lungs are clear.  Heart size is upper normal.  No pneumothorax or pleural fluid.  IMPRESSION: No acute disease.   Original Report Authenticated By: Holley Dexter, M.D.     Jeoffrey Massed, MD  [facility] Pager:[phone]  If 7PM-7AM, please contact night-coverage [email or web address] Password [email or web address] 07/08/2012, 1:06 PM   LOS: 4 days

## 2012-07-08 NOTE — Progress Notes (Signed)
Subjective:  Complained of mild dizziness today but no significant shortness of breath. No chest discomfort. She had the rest images today and will have the stress images tomorrow.  Objective:  Vital Signs in the last 24 hours: BP 132/63  Pulse 87  Temp(Src) 98.6 F (37 C) (Oral)  Resp 18  Ht 5\' 7"  (1.702 m)  Wt 149.2 kg (328 lb 14.8 oz)  BMI 51.51 kg/m2  SpO2 96%  Physical Exam: Morbidly obese black female in no acute distress Lungs:  Clear  Cardiac:  Regular rhythm, normal S1 and S2, no S3 Extremities:  Trace edema present  Intake/Output from previous day:   Weight Filed Weights   07/04/12 2215 07/05/12 0500 07/06/12 1509  Weight: 143.2 kg (315 lb 11.2 oz) 143.2 kg (315 lb 11.2 oz) 149.2 kg (328 lb 14.8 oz)    Lab Results: Basic Metabolic Panel:  Recent Labs  04/54/09 0400 07/08/12 0410  NA 133* 133*  K 3.9 3.7  CL 102 102  CO2 22 20  GLUCOSE 377* 241*  BUN 29* 20  CREATININE 1.76* 1.25*    BNP    Component Value Date/Time   PROBNP 269.9* 07/07/2012 0909   Assessment/Plan:  1. Possible silent anteroseptal myocardial infarction  2. Prior dyspnea likely multifactorial  3. Recent diagnosis of diabetes with diabetic ketoacidosis  4. Hypertensive heart disease 5. Renal function improved overmight  Recommendations:  Her renal function has improved today. She is to have the stress images tomorrow and [facility] cardiology will see her over the next couple of days. If she is low risk she can be discharged and I can follow her up as an outpatient. If she has a high risk scan she may need to have catheterization   W. Ashley Royalty  MD [facility]  07/08/2012, 10:58 AM

## 2012-07-09 ENCOUNTER — Inpatient Hospital Stay (HOSPITAL_COMMUNITY): Payer: Medicare Other

## 2012-07-09 DIAGNOSIS — R0602 Shortness of breath: Secondary | ICD-10-CM | POA: Diagnosis present

## 2012-07-09 LAB — GLUCOSE, CAPILLARY
Glucose-Capillary: 297 mg/dL — ABNORMAL HIGH (ref 70–99)
Glucose-Capillary: 245 mg/dL — ABNORMAL HIGH (ref 70–99)
Glucose-Capillary: 276 mg/dL — ABNORMAL HIGH (ref 70–99)
Glucose-Capillary: 196 mg/dL — ABNORMAL HIGH (ref 70–99)

## 2012-07-09 LAB — BASIC METABOLIC PANEL
BUN: 13 mg/dL (ref 6–23)
Calcium: 9 mg/dL (ref 8.4–10.5)
GFR calc Af Amer: 60 mL/min — ABNORMAL LOW (ref >90)
GFR calc non Af Amer: 52 mL/min — ABNORMAL LOW (ref >90)
Potassium: 3.9 mEq/L (ref 3.5–5.1)
Sodium: 138 mEq/L (ref 135–145)

## 2012-07-09 MED ORDER — TECHNETIUM TC 99M SESTAMIBI GENERIC - CARDIOLITE
30.0000 | Freq: Once | INTRAVENOUS | Status: AC | PRN
  Administered 2012-07-09: 30 via INTRAVENOUS

## 2012-07-09 MED ORDER — INSULIN ASPART 100 UNIT/ML ~~LOC~~ SOLN
18.0000 [IU] | Freq: Three times a day (TID) | SUBCUTANEOUS | Status: DC
  Administered 2012-07-09 – 2012-07-11 (×6): 18 [IU] via SUBCUTANEOUS

## 2012-07-09 MED ORDER — INSULIN GLARGINE 100 UNIT/ML ~~LOC~~ SOLN
40.0000 [IU] | Freq: Every day | SUBCUTANEOUS | Status: DC
  Administered 2012-07-09 – 2012-07-10 (×2): 40 [IU] via SUBCUTANEOUS
  Filled 2012-07-09 (×3): qty 0.4

## 2012-07-09 MED ORDER — TECHNETIUM TC 99M SESTAMIBI GENERIC - CARDIOLITE
30.0000 | Freq: Once | INTRAVENOUS | Status: AC | PRN
  Administered 2012-07-08: 30 via INTRAVENOUS

## 2012-07-09 MED ORDER — METOPROLOL TARTRATE 12.5 MG HALF TABLET
12.5000 mg | ORAL_TABLET | Freq: Two times a day (BID) | ORAL | Status: DC
  Filled 2012-07-09 (×2): qty 1

## 2012-07-09 MED ORDER — REGADENOSON 0.4 MG/5ML IV SOLN
0.4000 mg | Freq: Once | INTRAVENOUS | Status: AC
  Administered 2012-07-09: 0.4 mg via INTRAVENOUS

## 2012-07-09 MED ORDER — METOPROLOL SUCCINATE 12.5 MG HALF TABLET
12.5000 mg | ORAL_TABLET | Freq: Every day | ORAL | Status: DC
  Administered 2012-07-09: 12.5 mg via ORAL
  Filled 2012-07-09 (×2): qty 1

## 2012-07-09 NOTE — Progress Notes (Signed)
[facility] PROGRESS NOTE  [name] ZOX:[id number] DOB: 08-Jan-1950 DOA: 07/04/2012 PCP: Default, Provider, MD  Assessment/Plan: Active Problems:  DKA (Diabetic Ketoacidosis)  -Resolved  -On admission, required Insulin gtt, and has been transitioned to Lantus/Novolg regimen  -Will be discharged on a long-acting and short-acting insulin regimen  Diabetes Mellitus, new onset  -A1C 13.2 on admission -CBG's still uncontrolled, but better than last few days  -Increase Lantus to 40 units QHS, increase Novolog 18 units with meals  -Diabetic education, and insulin teaching to be performed.  Acute renal failure  -Likely pre-renal  -Stop HCTZ for now while inpaitient, c/w ACEI cautiously  -Monitor electrolytes, which have been steadily improving   New onset Systolic Heart Failure  -Given obesity - hard to assess volume status, but is not overtly overloaded, and fairly well-compensated  -She does give a hx of exertional dyspnea that is different from her usual baseline- -Given that she has new wall motion changes on her ECHO (prior Echo in 2009 did not show these changes), Cardiology was consulted, and is undergoing 2-day stress test  -c/w ASA , c/w ACEI, add metoprolol  HTN (hypertension)  -BP continuously 148/77  -c/w Lisinopril cautiously while inpatient -add Metoprolol today  Morbid obesity with BMI of 45.0-49.9, adult  -Counseled regarding weight loss   CKD (chronic kidney disease) stage 2, GFR 60-89 ml/min  -Hopefully there will be further tight glycemic control, creatinine slowly improving  -CBG 297 today; needs to continue to trend downwards  Disposition:  Discharge today if second-day stress test is negative.  Diabetic education with tight glycemic control emphasized to patient.  Importance of outpatient follow-up for blood pressure and diabetic control.  DVT Prophylaxis:  Prophylactic Heparin    Code Status: Full Family Communication: no one at  bedside Disposition Plan: Will be discharged if stress test today is negative.  Patient will be educated on diabetes and daily insulin use.  She is understanding that she will need to keep her blood sugar under control.   Consultants:  Cardiology   Procedures:  ECHO 07/03 - left ventricular systolic function mildy-moderately reduced with estimated EF 40-45%.  Regional wall motion abnormalities with hypokinesis of the anteroseptal and anterior myocardium,.  Follow-up stress test (07/07) from ECHO - waiting for results, if negative likely discharge this afternoon.   HPI/Subjective: [name] is a 62 y.o. female with PMH significant for HTN, who was referred for admission by her PCP due to hyperglycemia. Patient has had dizziness, polyuria, polydipsia, for the last 5 weeks. No prior history of Diabetes. She relates dry cough for last 2 weeks. She has had a  decreased appetite, and vomited once yesterday. She has had abdominal pain for last 2 weeks, intermittently. She denies Current abdominal pain. She has been too dizzy to ambulate. She denies chest pain, dyspnea, fevers, dysuria. Patient was admitted for further evaluation.  Today, patient states she is feeling markedly better and is able to ambulate without difficulty, or shortness of breath.  She has not been urinating as much as she has been these past few days.  She denies vision changes, headache, nausea or vomiting, or diarrhea.  She has had a good appetite.  She is understanding with her new diagnosis of diabetes and is waiting for her insulin education.  If stress test comes back negative, she will be discharged later this afternoon.   Objective: Filed Vitals:   07/08/12 2140 07/09/12 0204 07/09/12 0600 07/09/12 0911  BP: 157/93 145/82 112/74  Pulse: 92 95 78   Temp: 97.9 F (36.6 C) 98.3 F (36.8 C) 97.7 F (36.5 C)   TempSrc: Oral Oral Oral   Resp: 18 17 18    Height:      Weight:   149 kg (328 lb 7.8 oz)   SpO2: 97%  95% 94% 97%   No intake or output data in the 24 hours ending 07/09/12 1007 Filed Weights   07/05/12 0500 07/06/12 1509 07/09/12 0600  Weight: 143.2 kg (315 lb 11.2 oz) 149.2 kg (328 lb 14.8 oz) 149 kg (328 lb 7.8 oz)    Exam:   General:  Patient awake, A&Ox3, in no acute distress.  Cardiovascular: RRR, without mgr.  Respiratory: lungs clear to auscultation bilaterally, without wheezes, rhonchi, or rales.  Abdomen: soft, nontender, nondistended, BS+  Musculoskeletal: AROM of all 4 extremities, no edema noted.\  Skin: no rash, bruising, or ulceration.  Data Reviewed: Basic Metabolic Panel:  Recent Labs Lab 07/05/12 1349 07/06/12 0049 07/07/12 0400 07/08/12 0410 07/09/12 0530  NA 138 135 133* 133* 138  K 3.3* 3.0* 3.9 3.7 3.9  CL 102 101 102 102 102  CO2 23 24 22 20 25   GLUCOSE 161* 122* 377* 241* 253*  BUN 34* 32* 29* 20 13  CREATININE 1.73* 1.98* 1.76* 1.25* 1.11*  CALCIUM 9.1 8.7 8.9 9.2 9.0   Liver Function Tests:  Recent Labs Lab 07/04/12 1654  AST 18  ALT 25  ALKPHOS 146*  BILITOT 0.3  PROT 8.2  ALBUMIN 3.8   CBC:  Recent Labs Lab 07/04/12 1654 07/05/12 0015  WBC 9.7 10.5  HGB 13.9 12.4  HCT 42.0 36.2  MCV 79.2 77.4*  PLT 285 247   Cardiac Enzymes:  Recent Labs Lab 07/05/12 0015 07/05/12 0420 07/05/12 0914  TROPONINI <0.30 <0.30 <0.30   BNP (last 3 results)  Recent Labs  01/22/12 1240 07/07/12 0909  PROBNP 435.3* 269.9*   CBG:  Recent Labs Lab 07/08/12 0744 07/08/12 1219 07/08/12 1734 07/08/12 2134 07/09/12 0757  GLUCAP 307* 301* 255* 335* 297*    Recent Results (from the past 240 hour(s))  CULTURE, BLOOD (ROUTINE X 2)     Status: None   Collection Time    07/04/12  8:40 PM      Result Value Range Status   Specimen Description BLOOD ARM LEFT   Final   Special Requests BOTTLES DRAWN AEROBIC ONLY 4CC   Final   Culture  Setup Time 07/05/2012 04:02   Final   Culture     Final   Value:        BLOOD CULTURE RECEIVED  NO GROWTH TO DATE CULTURE WILL BE HELD FOR 5 DAYS BEFORE ISSUING A FINAL NEGATIVE REPORT   Report Status PENDING   Incomplete  MRSA PCR SCREENING     Status: None   Collection Time    07/04/12 10:35 PM      Result Value Range Status   MRSA by PCR NEGATIVE  NEGATIVE Final   Comment:            The GeneXpert MRSA Assay (FDA     approved for NASAL specimens     only), is one component of a     comprehensive MRSA colonization     surveillance program. It is not     intended to diagnose MRSA     infection nor to guide or     monitor treatment for     MRSA infections.  URINE CULTURE  Status: None   Collection Time    07/05/12 12:05 AM      Result Value Range Status   Specimen Description URINE, CLEAN CATCH   Final   Special Requests NONE   Final   Culture  Setup Time 07/05/2012 00:57   Final   Colony Count 7,000 COLONIES/ML   Final   Culture INSIGNIFICANT GROWTH   Final   Report Status 07/06/2012 FINAL   Final  CULTURE, BLOOD (ROUTINE X 2)     Status: None   Collection Time    07/05/12 12:27 AM      Result Value Range Status   Specimen Description BLOOD LEFT ANTECUBITAL   Final   Special Requests BOTTLES DRAWN AEROBIC ONLY 10CC   Final   Culture  Setup Time 07/05/2012 09:40   Final   Culture     Final   Value:        BLOOD CULTURE RECEIVED NO GROWTH TO DATE CULTURE WILL BE HELD FOR 5 DAYS BEFORE ISSUING A FINAL NEGATIVE REPORT   Report Status PENDING   Incomplete     Studies: No results found.  Scheduled Meds: . aspirin EC  81 mg Oral Daily  . heparin  5,000 Units Subcutaneous Q8H  . insulin aspart  0-20 Units Subcutaneous TID WC  . insulin aspart  14 Units Subcutaneous TID WC  . insulin glargine  36 Units Subcutaneous QHS  . Insulin Pen Starter Kit  1 kit Other Once  . lisinopril  20 mg Oral Daily  . metoprolol succinate  12.5 mg Oral Daily  . mometasone-formoterol  2 puff Inhalation BID  . pantoprazole  40 mg Oral BID  . sodium chloride  3 mL Intravenous Q12H  .  sodium chloride  3 mL Intravenous Q12H   Continuous Infusions: . insulin (NOVOLIN-R) infusion 5.7 Units/hr (07/06/12 0050)    Active Problems:   DKA (diabetic ketoacidoses)   Acute renal failure   Diabetes mellitus, new onset   Hypertensive heart disease   Asthma, chronic   Morbid obesity with BMI of 45.0-49.9, adult   CKD (chronic kidney disease) stage 2, GFR 60-89 ml/min   Chronic systolic CHF (congestive heart failure)    MACHAJ, VERONICA PA-S [facility] 07/09/2012, 10:07 AM  LOS: 5 days   Attending Patient was seen and examined, agree with the above assessment and plan. CBGs still uncontrolled, increase Lantus to 40 units, increase NovoLog to 18 units with meals. Await results of stress test, if low risk will be discharged today.  S Ghimire

## 2012-07-09 NOTE — Progress Notes (Signed)
Lexiscan Myoview performed without complications.  Resting EKG showed NSR with T wave inversions inferolaterally with no change during Lexiscan infusion.  Patient was symptomatic.  Nuclear images pending.

## 2012-07-09 NOTE — Progress Notes (Signed)
Spoke with pt about new diagnosis of diabetes. Discussed basic pathophysiology of DM Type 2, basic home care, importance of checking CBGs and maintaining good CBG control to prevent long-term and short-term complications. Reviewed signs and symptoms of hyperglycemia and hypoglycemia along with treatment for both. Patient has been given the Living Well with Diabetes Booklet and has already reviewed it.  Patient has also watched the diabetes videos on the patient education network.  At time of discharge, patient will be using Lantus and Novolog insulin pens.  Discussed both insulins and demonstrated proper insulin pen injection administration.  Patient verbalized understanding and was able to demonstrate proper insulin pen injection administration.  Provided the patient with Novolog starter booklet and Lantus Solostar handout with step by step instructions on insulin pen injections.  Patient is appreciative of consult and reports that she feels more comfortable with using the insulin pen now that she has been able to practice with it.  At time of discharge, patient will need prescription for Lantus Solostar insulin pen, Novolog flexpen, and insulin pen needles.  Thanks, Orlando Penner, RN, MSN, CCRN Diabetes Coordinator Inpatient Diabetes Program [phone]

## 2012-07-10 ENCOUNTER — Inpatient Hospital Stay (HOSPITAL_COMMUNITY): Payer: Medicare Other

## 2012-07-10 LAB — GLUCOSE, CAPILLARY
Glucose-Capillary: 206 mg/dL — ABNORMAL HIGH (ref 70–99)
Glucose-Capillary: 222 mg/dL — ABNORMAL HIGH (ref 70–99)
Glucose-Capillary: 211 mg/dL — ABNORMAL HIGH (ref 70–99)
Glucose-Capillary: 96 mg/dL (ref 70–99)

## 2012-07-10 LAB — PRO B NATRIURETIC PEPTIDE: Pro B Natriuretic peptide (BNP): 223.1 pg/mL — ABNORMAL HIGH (ref 0–125)

## 2012-07-10 MED ORDER — METFORMIN HCL 500 MG PO TABS
500.0000 mg | ORAL_TABLET | Freq: Two times a day (BID) | ORAL | Status: DC
  Administered 2012-07-10 – 2012-07-11 (×3): 500 mg via ORAL
  Filled 2012-07-10 (×5): qty 1

## 2012-07-10 MED ORDER — GEMFIBROZIL 600 MG PO TABS: 600.0000 mg | ORAL_TABLET | Freq: Two times a day (BID) | ORAL | Status: DC

## 2012-07-10 MED ORDER — FREESTYLE SYSTEM KIT: 1.0000 | PACK | Status: DC | PRN

## 2012-07-10 MED ORDER — INSULIN GLARGINE 100 UNIT/ML SOLOSTAR PEN: 40.0000 [IU] | PEN_INJECTOR | Freq: Every day | SUBCUTANEOUS | Status: DC

## 2012-07-10 MED ORDER — INSULIN ASPART 100 UNIT/ML ~~LOC~~ SOLN: 18.0000 [IU] | Freq: Three times a day (TID) | SUBCUTANEOUS | Status: DC

## 2012-07-10 MED ORDER — INSULIN PEN NEEDLE 32G X 4 MM MISC: Status: DC

## 2012-07-10 MED ORDER — ALBUTEROL SULFATE (5 MG/ML) 0.5% IN NEBU
2.5000 mg | INHALATION_SOLUTION | Freq: Three times a day (TID) | RESPIRATORY_TRACT | Status: DC
  Administered 2012-07-10 – 2012-07-11 (×3): 2.5 mg via RESPIRATORY_TRACT
  Filled 2012-07-10 (×3): qty 0.5

## 2012-07-10 MED ORDER — ASPIRIN 81 MG PO TBEC: 81.0000 mg | DELAYED_RELEASE_TABLET | Freq: Every day | ORAL | Status: DC

## 2012-07-10 MED ORDER — FREESTYLE LANCETS MISC
Status: DC
Start: 2012-07-10 — End: 2013-05-20

## 2012-07-10 MED ORDER — GEMFIBROZIL 600 MG PO TABS
600.0000 mg | ORAL_TABLET | Freq: Two times a day (BID) | ORAL | Status: DC
  Administered 2012-07-10 – 2012-07-11 (×3): 600 mg via ORAL
  Filled 2012-07-10 (×5): qty 1

## 2012-07-10 MED ORDER — FUROSEMIDE 8 MG/ML PO SOLN
40.0000 mg | Freq: Once | ORAL | Status: AC
  Administered 2012-07-10: 40 mg via ORAL
  Filled 2012-07-10: qty 5

## 2012-07-10 MED ORDER — INSULIN ASPART 100 UNIT/ML FLEXPEN: 18.0000 [IU] | PEN_INJECTOR | Freq: Three times a day (TID) | SUBCUTANEOUS | Status: DC

## 2012-07-10 MED ORDER — METOPROLOL SUCCINATE ER 25 MG PO TB24
25.0000 mg | ORAL_TABLET | Freq: Every day | ORAL | Status: DC
  Administered 2012-07-10 – 2012-07-11 (×2): 25 mg via ORAL
  Filled 2012-07-10 (×2): qty 1

## 2012-07-10 NOTE — Discharge Summary (Signed)
PATIENT DETAILS Name: [name] Age: 62 y.o. Sex: female Date of Birth: [birth year] MRN: [id number]. Admit Date: 07/04/2012 Admitting Physician: Alba Cory, MD WUJ:[id number], Provider, MD  Recommendations for Outpatient Follow-up:  1. Please optimize insulin regimen further 2. Encouraged weight loss 3. Will need referral for a sleep study and then pulmonary follow up 4. Check lipid panel in 3-6 months-started on Lopid 5. Monitor her electrolytes closely as an outpatient, as patient will be on lisinopril and metformin.   PRIMARY DISCHARGE DIAGNOSIS:  Active Problems:   DKA (diabetic ketoacidoses)   Acute renal failure   Diabetes mellitus, new onset   Hypertensive heart disease   Probable history of coronary artery disease with recent silent MI.   Asthma, chronic   Morbid obesity with BMI of 45.0-49.9, adult   CKD (chronic kidney disease) stage 2, GFR 60-89 ml/min   Chronic systolic CHF (congestive heart failure)   SOB (shortness of breath)      PAST MEDICAL HISTORY: Past Medical History  Diagnosis Date  . Osteoarthritis   . Hypertension   . Morbid obesity     DISCHARGE MEDICATIONS:   Medication List    STOP taking these medications       amLODipine 5 MG tablet  Commonly known as:  NORVASC     lisinopril-hydrochlorothiazide 20-12.5 MG per tablet  Commonly known as:  PRINZIDE,ZESTORETIC     multivitamin with minerals Tabs     OVER THE COUNTER MEDICATION      TAKE these medications       albuterol 108 (90 BASE) MCG/ACT inhaler  Commonly known as:  PROVENTIL HFA;VENTOLIN HFA  Inhale 1-2 puffs into the lungs every 6 (six) hours as needed for wheezing.     aspirin 81 MG EC tablet  Take 1 tablet (81 mg total) by mouth daily.     Fluticasone-Salmeterol 250-50 MCG/DOSE Aepb  Commonly known as:  ADVAIR  Inhale 1 puff into the lungs every 12 (twelve) hours.     freestyle lancets  Use as instructed     gemfibrozil 600 MG tablet  Commonly known as:   LOPID  Take 1 tablet (600 mg total) by mouth 2 (two) times daily before a meal.     glucose monitoring kit monitoring kit  1 each by Does not apply route as needed for other. Please check your sugars 3-4 times a day, and make a record of the reading, and take it to your Primary MD at your next follow up visit     insulin aspart 100 UNIT/ML injection  Commonly known as:  novoLOG  Inject 18 Units into the skin 3 (three) times daily with meals.     insulin aspart 100 UNIT/ML Sopn FlexPen  Commonly known as:  NOVOLOG FLEXPEN  Inject 18 Units into the skin 3 (three) times daily with meals.     Insulin Glargine 100 UNIT/ML Sopn  Commonly known as:  LANTUS SOLOSTAR  Inject 40 Units into the skin at bedtime.     Insulin Pen Needle 32G X 4 MM Misc  Please use accordingly to inject insulin        ALLERGIES:  No Known Allergies  BRIEF HPI:  See H&P, Labs, Consult and Test reports for all details in brief, [name] is a 62 y.o. female with PMH significant for HTN, and morbid obesity who was referred to the ED by her primary care practitioner for hyperglycemia and dizziness. Patient did complain of some polyuria and polydipsia for the  past 5 weeks, she was found to have diabetic ketoacidosis and admitted to the hospitalist service for further evaluation and treatment.   CONSULTATIONS:   cardiology  PERTINENT RADIOLOGIC STUDIES: Dg Chest 2 View  07/04/2012   *RADIOLOGY REPORT*  Clinical Data: Dizziness.  Hyperglycemia.  CHEST - 2 VIEW  Comparison: PA and lateral chest 01/22/2012.  Findings: Lungs are clear.  Heart size is upper normal.  No pneumothorax or pleural fluid.  IMPRESSION: No acute disease.   Original Report Authenticated By: Holley Dexter, M.D.   Nm Myocar Multi W/spect W/wall Motion / Ef  07/09/2012   Clinical Data:  Abnormal EKG.  Diabetes.  Hypertension.  Shortness of breath.  Technique:  Standard myocardial SPECT imaging performed after resting intravenous injection  of Tc-68m tetrofosmin.  Subsequently, stress in the form of Lexiscan was administered under the supervision of the Cardiology staff.  At peak stress, Tc-28m tetrofosmin was injected intravenously and standard myocardial SPECT imaging performed.  Quantitative gated imaging also performed to evaluate left ventricular wall motion and estimate left ventricular ejection fraction.  Radiopharmaceutical: Tc-35m tetrofosmin, 30 mCi at rest and 13 mCi at stress.  Comparison:  Chest radiograph of 07/04/2012  MYOCARDIAL IMAGING WITH SPECT (REST AND STRESS)  Findings:  Matched reduced activity in the apicoseptal and lateral basal segments of the left ventricle noted, suspicious for scar. No inducible ischemia identified.  LEFT VENTRICULAR EJECTION FRACTION  Findings:  Left ventricular end-diastolic volume is 113 cc.  End- systolic volume is 51 cc.  Derived LV ejection fraction is 54%.  GATED LEFT VENTRICULAR WALL MOTION STUDY  Findings:  Mild lateral wall and apical septal hypokinesis.  IMPRESSION:  1.  Suspected lateral basal and apicoseptal scarring.  No inducible ischemia.  Ejection fraction 54%.   Original Report Authenticated By: Gaylyn Rong, M.D.     PERTINENT LAB RESULTS: CBC: No results found for this basename: WBC, HGB, HCT, PLT,  in the last 72 hours CMET CMP     Component Value Date/Time   NA 138 07/09/2012 0530   K 3.9 07/09/2012 0530   CL 102 07/09/2012 0530   CO2 25 07/09/2012 0530   GLUCOSE 253* 07/09/2012 0530   BUN 13 07/09/2012 0530   CREATININE 1.11* 07/09/2012 0530   CALCIUM 9.0 07/09/2012 0530   PROT 8.2 07/04/2012 1654   ALBUMIN 3.8 07/04/2012 1654   AST 18 07/04/2012 1654   ALT 25 07/04/2012 1654   ALKPHOS 146* 07/04/2012 1654   BILITOT 0.3 07/04/2012 1654   GFRNONAA 52* 07/09/2012 0530   GFRAA 60* 07/09/2012 0530    GFR Estimated Creatinine Clearance: 80 ml/min (by C-G formula based on Cr of 1.11). No results found for this basename: LIPASE, AMYLASE,  in the last 72 hours No results found for this  basename: CKTOTAL, CKMB, CKMBINDEX, TROPONINI,  in the last 72 hours No components found with this basename: POCBNP,  No results found for this basename: DDIMER,  in the last 72 hours No results found for this basename: HGBA1C,  in the last 72 hours No results found for this basename: CHOL, HDL, LDLCALC, TRIG, CHOLHDL, LDLDIRECT,  in the last 72 hours No results found for this basename: TSH, T4TOTAL, FREET3, T3FREE, THYROIDAB,  in the last 72 hours No results found for this basename: VITAMINB12, FOLATE, FERRITIN, TIBC, IRON, RETICCTPCT,  in the last 72 hours Coags: No results found for this basename: PT, INR,  in the last 72 hours Microbiology: Recent Results (from the past 240 hour(s))  CULTURE, BLOOD (  ROUTINE X 2)     Status: None   Collection Time    07/04/12  8:40 PM      Result Value Range Status   Specimen Description BLOOD ARM LEFT   Final   Special Requests BOTTLES DRAWN AEROBIC ONLY 4CC   Final   Culture  Setup Time 07/05/2012 04:02   Final   Culture     Final   Value:        BLOOD CULTURE RECEIVED NO GROWTH TO DATE CULTURE WILL BE HELD FOR 5 DAYS BEFORE ISSUING A FINAL NEGATIVE REPORT   Report Status PENDING   Incomplete  MRSA PCR SCREENING     Status: None   Collection Time    07/04/12 10:35 PM      Result Value Range Status   MRSA by PCR NEGATIVE  NEGATIVE Final   Comment:            The GeneXpert MRSA Assay (FDA     approved for NASAL specimens     only), is one component of a     comprehensive MRSA colonization     surveillance program. It is not     intended to diagnose MRSA     infection nor to guide or     monitor treatment for     MRSA infections.  URINE CULTURE     Status: None   Collection Time    07/05/12 12:05 AM      Result Value Range Status   Specimen Description URINE, CLEAN CATCH   Final   Special Requests NONE   Final   Culture  Setup Time 07/05/2012 00:57   Final   Colony Count 7,000 COLONIES/ML   Final   Culture INSIGNIFICANT GROWTH   Final    Report Status 07/06/2012 FINAL   Final  CULTURE, BLOOD (ROUTINE X 2)     Status: None   Collection Time    07/05/12 12:27 AM      Result Value Range Status   Specimen Description BLOOD LEFT ANTECUBITAL   Final   Special Requests BOTTLES DRAWN AEROBIC ONLY 10CC   Final   Culture  Setup Time 07/05/2012 09:40   Final   Culture     Final   Value:        BLOOD CULTURE RECEIVED NO GROWTH TO DATE CULTURE WILL BE HELD FOR 5 DAYS BEFORE ISSUING A FINAL NEGATIVE REPORT   Report Status PENDING   Incomplete     BRIEF HOSPITAL COURSE:   Hyperosmolar hyperglycemic state  -Admitted to the hospital with glucose level of 791, bicarbonate of 19. -On admission, required Insulin gtt, and has been transitioned to Lantus/Novolg regimen  -Will be discharged on a long-acting and short-acting insulin regimen  Diabetes Mellitus, new onset  -A1C 13.2 on admission - During hospital stay, Lantus and NovoLog insulin has been slowly increased, her Lantus dose and on discharge will be 40 units at bedtime, she will be on NovoLog 18 units with meals, her sugars have slowly come down to the high 100s to low 200s range.  -Her creatinine is back to normal, placed on metformin 500 mg by mouth twice a day. - Please do know, extensive diabetic education, insulin education was done by the diabetic team, RN, and me as well. She is well aware of the long-term and short-term complications of uncontrolled diabetes, she is well aware of hypoglycemic symptoms and needed interventions for it. - Nutrition evaluation was also done.  Acute renal failure  -  Likely pre-renal from DKA and diuretic therapy  - Her creatinine has come down to 1.1 on by the day of discharge, highest creatinine this admission was 1.98  - Monitor her electrolytes closely as an outpatient, as patient will be on lisinopril and metformin.   Newly diagnosed Chronic Systolic Heart Failure- clinically compensated -Given obesity - hard to assess volume status, but  is not overtly overloaded, and fairly well-compensated  -She does give a hx of exertional dyspnea that is different from her usual baseline-  -Given that she has new wall motion changes, and a EF around 40-45% on her ECHO (prior Echo in 2009 did not show these changes). -Cardiology was consulted, and  Underwent a 2-day stress test, which showed no inducible ischemia, LVEF is 54% -It did show suspected lateral basilar and apical septal scarring , probably from a prior silent MI. -c/w ASA , c/w ACEI, and metoprolol on discharge - Followup with Dr. Donnie Aho in 2 weeks  HTN (hypertension) - Started on lisinopril and Toprol this admission, further optimization will be done in the outpatient setting.  Hypertriglyceridemia - Start Lopid at the time of discharge. Please recheck lipid panel in the next 3-6 months, this is consistent with dyslipidemia secondary to uncontrolled diabetes.   Morbid obesity with BMI of 45.0-49.9, adult  -Counseled regarding weight loss   Chronic kidney disease stage II - As noted above, did have mild renal failure on admission, creatinine very close to her usual baseline  DAY OF DISCHARGE:  Subjective:   Elva Mauro today has no headache,no chest abdominal pain,no new weakness tingling or numbness, feels much better wants to go home today.  Objective:   Blood pressure 121/83, pulse 88, temperature 97.4 F (36.3 C), temperature source Oral, resp. rate 20, height 5\' 7"  (1.702 m), weight 148.6 kg (327 lb 9.7 oz), SpO2 98.00%.  Intake/Output Summary (Last 24 hours) at 07/10/12 1134 Last data filed at 07/10/12 0500  Gross per 24 hour  Intake    600 ml  Output      2 ml  Net    598 ml   Filed Weights   07/06/12 1509 07/09/12 0600 07/10/12 0500  Weight: 149.2 kg (328 lb 14.8 oz) 149 kg (328 lb 7.8 oz) 148.6 kg (327 lb 9.7 oz)    Exam Awake Alert, Oriented *3, No new F.N deficits, Normal affect [place].AT,PERRAL Supple Neck,No JVD, No cervical lymphadenopathy  appriciated.  Symmetrical Chest wall movement, Good air movement bilaterally, CTAB RRR,No Gallops,Rubs or new Murmurs, No Parasternal Heave +ve B.Sounds, Abd Soft, Non tender, No organomegaly appriciated, No rebound -guarding or rigidity. No Cyanosis, Clubbing or edema, No new Rash or bruise  DISCHARGE CONDITION: Stable  DISPOSITION: Home  DISCHARGE INSTRUCTIONS:    Activity:  As tolerated with Full fall precautions use walker/cane & assistance as needed  Diet recommendation: Diabetic Diet Heart Healthy diet   Discharge Orders   Future Appointments Provider Department Dept Phone   08/05/2012 8:00 PM [facility] [phone]   08/06/2012 2:30 PM Coralyn Helling, MD [facility] [phone]   Future Orders Complete By Expires     Ambulatory referral to Nutrition and Diabetic Education  As directed     Comments:      New onset diabetes; will be taking Lantus and Novolog as an outpatient. A1C 13.2%.    Call MD for:  difficulty breathing, headache or visual disturbances  As directed     Diet -  low sodium heart healthy  As directed     Diet Carb Modified  As directed     Increase activity slowly  As directed       Follow-up Information   Follow up with AVBUERE,EDWIN A, MD. Schedule an appointment as soon as possible for a visit in 1 week.   Contact information:   [address] [place] [address] [phone]       Follow up with TILLEY JR,W SPENCER, MD. Schedule an appointment as soon as possible for a visit in 2 weeks.   Contact information:   [address] [place] [address] [phone]       Follow up with [facility] On 08/05/2012. (they will mail out the information to you )    Contact information:   [address] [place] [address] [phone]      Follow up with Coralyn Helling, MD On 08/06/2012. (2:30 )    Contact information:   [address] [place] [address] [phone]        Total Time spent on discharge equals 45 minutes.  SignedJeoffrey Massed 07/10/2012 11:34 AM

## 2012-07-10 NOTE — Progress Notes (Signed)
[facility] PROGRESS NOTE  [name] ZOX:[id number] DOB: 1950/07/05 DOA: 07/04/2012 PCP: Default, Provider, MD  Assessment/Plan: Active Problems:  DKA (Diabetic Ketoacidosis)  -Resolved  -On admission, required Insulin gtt, and has been transitioned to Lantus/Novolg regimen  -Will be discharged on a long-acting and short-acting insulin regimen  Diabetes Mellitus, new onset  -A1C 13.2 on admission -CBG's still uncontrolled, but better than last few days  -Continue with Lantus 40 units QHS, and Novolog 18 units with meals  -Diabetic education, and insulin teaching to be performed.  SOB-Minimal -?developing CHF decompensation -start IV Lasix, start scheduled nebs -reassess in am  Acute renal failure  -Likely pre-renal  -Stop HCTZ for now while inpaitient, c/w ACEI cautiously  -Monitor electrolytes, which have been steadily improving   New onset Systolic Heart Failure  -Given obesity - hard to assess volume status, but getting SOB today-?developing CHF decompensation-start Lasix -She does give a hx of exertional dyspnea that is different from her usual baseline- -Given that she has new wall motion changes on her ECHO (prior Echo in 2009 did not show these changes), Cardiology was consulted, and underwent 2-day Nuclear stress test-negative -c/w ASA , c/w ACEI, add metoprolol  HTN (hypertension)  -BP continuously 148/77  -c/w Lisinopril cautiously while inpatient -add Metoprolol today  Morbid obesity with BMI of 45.0-49.9, adult  -Counseled regarding weight loss   CKD (chronic kidney disease) stage 2, GFR 60-89 ml/min  -Hopefully there will be further tight glycemic control, creatinine slowly improving  -CBG 297 today; needs to continue to trend downwards  Disposition:  Discharge likely in am  DVT Prophylaxis:  Prophylactic Heparin    Code Status: Full Family Communication: no one at bedside  Consultants:  Cardiology   Procedures:  ECHO 07/03 - left  ventricular systolic function mildy-moderately reduced with estimated EF 40-45%.  Regional wall motion abnormalities with hypokinesis of the anteroseptal and anterior myocardium,.  Follow-up stress test (07/07) from ECHO - waiting for results, if negative likely discharge this afternoon.   HPI/Subjective: [name] is a 62 y.o. female with PMH significant for HTN, who was referred for admission by her PCP due to hyperglycemia. Patient has had dizziness, polyuria, polydipsia, for the last 5 weeks. No prior history of Diabetes. She relates dry cough for last 2 weeks. She has had a  decreased appetite, and vomited once yesterday. She has had abdominal pain for last 2 weeks, intermittently. She denies Current abdominal pain. She has been too dizzy to ambulate. She denies chest pain, dyspnea, fevers, dysuria. Patient was admitted for further evaluation.  Today, patient states she is feeling some shortness of breath that is slightly worse than her usual baseline   Objective: Filed Vitals:   07/09/12 2113 07/10/12 0500 07/10/12 0855 07/10/12 1039  BP:  162/80  121/83  Pulse:  77  88  Temp:  97.4 F (36.3 C)    TempSrc:  Oral    Resp:  20    Height:      Weight:  148.6 kg (327 lb 9.7 oz)    SpO2: 96% 97% 98%     Intake/Output Summary (Last 24 hours) at 07/10/12 1336 Last data filed at 07/10/12 0500  Gross per 24 hour  Intake    360 ml  Output      2 ml  Net    358 ml   Filed Weights   07/06/12 1509 07/09/12 0600 07/10/12 0500  Weight: 149.2 kg (328 lb 14.8 oz) 149 kg (328 lb  7.8 oz) 148.6 kg (327 lb 9.7 oz)    Exam:   General:  Patient awake, A&Ox3, in no acute distress.  Cardiovascular: RRR, without mgr.  Respiratory: lungs clear to auscultation bilaterally, without wheezes, rhonchi, or rales.  Abdomen: soft, nontender, nondistended, BS+  Musculoskeletal: AROM of all 4 extremities, no edema noted.\  Skin: no rash, bruising, or ulceration.  Data Reviewed: Basic  Metabolic Panel:  Recent Labs Lab 07/05/12 1349 07/06/12 0049 07/07/12 0400 07/08/12 0410 07/09/12 0530  NA 138 135 133* 133* 138  K 3.3* 3.0* 3.9 3.7 3.9  CL 102 101 102 102 102  CO2 23 24 22 20 25   GLUCOSE 161* 122* 377* 241* 253*  BUN 34* 32* 29* 20 13  CREATININE 1.73* 1.98* 1.76* 1.25* 1.11*  CALCIUM 9.1 8.7 8.9 9.2 9.0   Liver Function Tests:  Recent Labs Lab 07/04/12 1654  AST 18  ALT 25  ALKPHOS 146*  BILITOT 0.3  PROT 8.2  ALBUMIN 3.8   CBC:  Recent Labs Lab 07/04/12 1654 07/05/12 0015  WBC 9.7 10.5  HGB 13.9 12.4  HCT 42.0 36.2  MCV 79.2 77.4*  PLT 285 247   Cardiac Enzymes:  Recent Labs Lab 07/05/12 0015 07/05/12 0420 07/05/12 0914  TROPONINI <0.30 <0.30 <0.30   BNP (last 3 results)  Recent Labs  01/22/12 1240 07/07/12 0909  PROBNP 435.3* 269.9*   CBG:  Recent Labs Lab 07/09/12 1332 07/09/12 1703 07/09/12 2154 07/10/12 0757 07/10/12 1141  GLUCAP 245* 276* 196* 206* 222*    Recent Results (from the past 240 hour(s))  CULTURE, BLOOD (ROUTINE X 2)     Status: None   Collection Time    07/04/12  8:40 PM      Result Value Range Status   Specimen Description BLOOD ARM LEFT   Final   Special Requests BOTTLES DRAWN AEROBIC ONLY 4CC   Final   Culture  Setup Time 07/05/2012 04:02   Final   Culture     Final   Value:        BLOOD CULTURE RECEIVED NO GROWTH TO DATE CULTURE WILL BE HELD FOR 5 DAYS BEFORE ISSUING A FINAL NEGATIVE REPORT   Report Status PENDING   Incomplete  MRSA PCR SCREENING     Status: None   Collection Time    07/04/12 10:35 PM      Result Value Range Status   MRSA by PCR NEGATIVE  NEGATIVE Final   Comment:            The GeneXpert MRSA Assay (FDA     approved for NASAL specimens     only), is one component of a     comprehensive MRSA colonization     surveillance program. It is not     intended to diagnose MRSA     infection nor to guide or     monitor treatment for     MRSA infections.  URINE CULTURE      Status: None   Collection Time    07/05/12 12:05 AM      Result Value Range Status   Specimen Description URINE, CLEAN CATCH   Final   Special Requests NONE   Final   Culture  Setup Time 07/05/2012 00:57   Final   Colony Count 7,000 COLONIES/ML   Final   Culture INSIGNIFICANT GROWTH   Final   Report Status 07/06/2012 FINAL   Final  CULTURE, BLOOD (ROUTINE X 2)     Status: None  Collection Time    07/05/12 12:27 AM      Result Value Range Status   Specimen Description BLOOD LEFT ANTECUBITAL   Final   Special Requests BOTTLES DRAWN AEROBIC ONLY 10CC   Final   Culture  Setup Time 07/05/2012 09:40   Final   Culture     Final   Value:        BLOOD CULTURE RECEIVED NO GROWTH TO DATE CULTURE WILL BE HELD FOR 5 DAYS BEFORE ISSUING A FINAL NEGATIVE REPORT   Report Status PENDING   Incomplete     Studies: Nm Myocar Multi W/spect W/wall Motion / Ef  07/09/2012   Clinical Data:  Abnormal EKG.  Diabetes.  Hypertension.  Shortness of breath.  Technique:  Standard myocardial SPECT imaging performed after resting intravenous injection of Tc-18m tetrofosmin.  Subsequently, stress in the form of Lexiscan was administered under the supervision of the Cardiology staff.  At peak stress, Tc-8m tetrofosmin was injected intravenously and standard myocardial SPECT imaging performed.  Quantitative gated imaging also performed to evaluate left ventricular wall motion and estimate left ventricular ejection fraction.  Radiopharmaceutical: Tc-41m tetrofosmin, 30 mCi at rest and 13 mCi at stress.  Comparison:  Chest radiograph of 07/04/2012  MYOCARDIAL IMAGING WITH SPECT (REST AND STRESS)  Findings:  Matched reduced activity in the apicoseptal and lateral basal segments of the left ventricle noted, suspicious for scar. No inducible ischemia identified.  LEFT VENTRICULAR EJECTION FRACTION  Findings:  Left ventricular end-diastolic volume is 113 cc.  End- systolic volume is 51 cc.  Derived LV ejection fraction is 54%.   GATED LEFT VENTRICULAR WALL MOTION STUDY  Findings:  Mild lateral wall and apical septal hypokinesis.  IMPRESSION:  1.  Suspected lateral basal and apicoseptal scarring.  No inducible ischemia.  Ejection fraction 54%.   Original Report Authenticated By: Gaylyn Rong, M.D.   Dg Chest Port 1 View  07/10/2012   *RADIOLOGY REPORT*  Clinical Data: Shortness of breath, weakness  PORTABLE CHEST - 1 VIEW  Comparison: 07/04/2012  Findings: Hypoaeration with interstitial and vascular crowding. Mild interstitial edema not excluded in this setting.  Heart size upper normal to mildly enlarged.  Mediastinal contours otherwise within normal range.  No confluent airspace opacity, pleural effusion, or pneumothorax.  No acute osseous finding.  IMPRESSION: Hypoaeration without focal consolidation.  Consider PA and lateral views when the patient can tolerate for better characterization.   Original Report Authenticated By: Jearld Lesch, M.D.    Scheduled Meds: . aspirin EC  81 mg Oral Daily  . gemfibrozil  600 mg Oral BID AC  . heparin  5,000 Units Subcutaneous Q8H  . insulin aspart  0-20 Units Subcutaneous TID WC  . insulin aspart  18 Units Subcutaneous TID WC  . insulin glargine  40 Units Subcutaneous QHS  . Insulin Pen Starter Kit  1 kit Other Once  . lisinopril  20 mg Oral Daily  . metFORMIN  500 mg Oral BID WC  . metoprolol succinate  25 mg Oral Daily  . mometasone-formoterol  2 puff Inhalation BID  . pantoprazole  40 mg Oral BID  . sodium chloride  3 mL Intravenous Q12H  . sodium chloride  3 mL Intravenous Q12H   Continuous Infusions: . insulin (NOVOLIN-R) infusion 5.7 Units/hr (07/06/12 0050)    Active Problems:   DKA (diabetic ketoacidoses)   Acute renal failure   Diabetes mellitus, new onset   Hypertensive heart disease   Asthma, chronic   Morbid obesity with  BMI of 45.0-49.9, adult   CKD (chronic kidney disease) stage 2, GFR 60-89 ml/min   Chronic systolic CHF (congestive heart  failure)   SOB (shortness of breath)    GHIMIRE,SHANKER PA-S [facility] 07/10/2012, 1:36 PM  LOS: 6 days   Attending Patient was seen and examined, agree with the above assessment and plan. CBGs still uncontrolled, increase Lantus to 40 units, increase NovoLog to 18 units with meals. Await results of stress test, if low risk will be discharged today.  S Ghimire

## 2012-07-10 NOTE — Progress Notes (Signed)
SUBJECTIVE:  Relieved that stress test was ok.  OBJECTIVE:   Vitals:   Filed Vitals:   07/09/12 2113 07/10/12 0500 07/10/12 0855 07/10/12 1039  BP:  162/80  121/83  Pulse:  77  88  Temp:  97.4 F (36.3 C)    TempSrc:  Oral    Resp:  20    Height:      Weight:  148.6 kg (327 lb 9.7 oz)    SpO2: 96% 97% 98%    I&O's:   Intake/Output Summary (Last 24 hours) at 07/10/12 1106 Last data filed at 07/10/12 0500  Gross per 24 hour  Intake    600 ml  Output      2 ml  Net    598 ml        PHYSICAL EXAM General: Well developed, well nourished, mild SHOB wuith walking Head:   Normal cephalic and atramatic  Lungs:   Clear bilaterally to auscultation and percussion. Heart:  HRRR S1 S2  Abdomen: Obese Msk:  Back normal, normal gait. Normal strength and tone for age.  Neuro: Alert and oriented Psych: Normal affect, responds appropriately   LABS: Basic Metabolic Panel:  Recent Labs  28/41/32 0410 07/09/12 0530  NA 133* 138  K 3.7 3.9  CL 102 102  CO2 20 25  GLUCOSE 241* 253*  BUN 20 13  CREATININE 1.25* 1.11*  CALCIUM 9.2 9.0   Liver Function Tests: No results found for this basename: AST, ALT, ALKPHOS, BILITOT, PROT, ALBUMIN,  in the last 72 hours No results found for this basename: LIPASE, AMYLASE,  in the last 72 hours CBC: No results found for this basename: WBC, NEUTROABS, HGB, HCT, MCV, PLT,  in the last 72 hours Cardiac Enzymes: No results found for this basename: CKTOTAL, CKMB, CKMBINDEX, TROPONINI,  in the last 72 hours BNP: No components found with this basename: POCBNP,  D-Dimer: No results found for this basename: DDIMER,  in the last 72 hours Hemoglobin A1C: No results found for this basename: HGBA1C,  in the last 72 hours Fasting Lipid Panel: No results found for this basename: CHOL, HDL, LDLCALC, TRIG, CHOLHDL, LDLDIRECT,  in the last 72 hours Thyroid Function Tests: No results found for this basename: TSH, T4TOTAL, FREET3, T3FREE, THYROIDAB,  in  the last 72 hours Anemia Panel: No results found for this basename: VITAMINB12, FOLATE, FERRITIN, TIBC, IRON, RETICCTPCT,  in the last 72 hours Coag Panel:   No results found for this basename: INR, PROTIME    RADIOLOGY: Dg Chest 2 View  07/04/2012   *RADIOLOGY REPORT*  Clinical Data: Dizziness.  Hyperglycemia.  CHEST - 2 VIEW  Comparison: PA and lateral chest 01/22/2012.  Findings: Lungs are clear.  Heart size is upper normal.  No pneumothorax or pleural fluid.  IMPRESSION: No acute disease.   Original Report Authenticated By: Holley Dexter, M.D.   Nm Myocar Multi W/spect W/wall Motion / Ef  07/09/2012   Clinical Data:  Abnormal EKG.  Diabetes.  Hypertension.  Shortness of breath.  Technique:  Standard myocardial SPECT imaging performed after resting intravenous injection of Tc-50m tetrofosmin.  Subsequently, stress in the form of Lexiscan was administered under the supervision of the Cardiology staff.  At peak stress, Tc-33m tetrofosmin was injected intravenously and standard myocardial SPECT imaging performed.  Quantitative gated imaging also performed to evaluate left ventricular wall motion and estimate left ventricular ejection fraction.  Radiopharmaceutical: Tc-54m tetrofosmin, 30 mCi at rest and 13 mCi at stress.  Comparison:  Chest radiograph  of 07/04/2012  MYOCARDIAL IMAGING WITH SPECT (REST AND STRESS)  Findings:  Matched reduced activity in the apicoseptal and lateral basal segments of the left ventricle noted, suspicious for scar. No inducible ischemia identified.  LEFT VENTRICULAR EJECTION FRACTION  Findings:  Left ventricular end-diastolic volume is 113 cc.  End- systolic volume is 51 cc.  Derived LV ejection fraction is 54%.  GATED LEFT VENTRICULAR WALL MOTION STUDY  Findings:  Mild lateral wall and apical septal hypokinesis.  IMPRESSION:  1.  Suspected lateral basal and apicoseptal scarring.  No inducible ischemia.  Ejection fraction 54%.   Original Report Authenticated By: Gaylyn Rong, M.D.      ASSESSMENT: DM, obesity  PLAN:  Negative stress test.  Spoke about the importance of weight loss longterm.  Needs to increase exercise.  Does appear a little SHOB with walking in the room.  Recheck BNP given that she likely received significant IV fluids in the setting of DKA.  Corky Crafts., MD  07/10/2012  11:06 AM

## 2012-07-11 DIAGNOSIS — E1101 Type 2 diabetes mellitus with hyperosmolarity with coma: Secondary | ICD-10-CM

## 2012-07-11 DIAGNOSIS — Z6841 Body Mass Index (BMI) 40.0 and over, adult: Secondary | ICD-10-CM

## 2012-07-11 LAB — GLUCOSE, CAPILLARY: Glucose-Capillary: 172 mg/dL — ABNORMAL HIGH (ref 70–99)

## 2012-07-11 LAB — CULTURE, BLOOD (ROUTINE X 2): Culture: NO GROWTH

## 2012-07-11 MED ORDER — LISINOPRIL 20 MG PO TABS: 20.0000 mg | ORAL_TABLET | Freq: Every day | ORAL | Status: DC

## 2012-07-11 MED ORDER — METFORMIN HCL 500 MG PO TABS: 500.0000 mg | ORAL_TABLET | Freq: Two times a day (BID) | ORAL | Status: AC

## 2012-07-11 NOTE — Progress Notes (Signed)
Inpatient Diabetes Program Recommendations  AACE/ADA: New Consensus Statement on Inpatient Glycemic Control (2013)  Target Ranges:  Prepandial:   less than 140 mg/dL      Peak postprandial:   less than 180 mg/dL (1-2 hours)      Critically ill patients:  140 - 180 mg/dL   Results for [name], [name] (MRN [id number]) as of 07/11/2012 09:14  Ref. Range 07/10/2012 07:57 07/10/2012 11:41 07/10/2012 17:20 07/10/2012 22:00 07/11/2012 07:48  Glucose-Capillary Latest Range: 70-99 mg/dL 409 (H) 811 (H) 914 (H) 96 172 (H)    Inpatient Diabetes Program Recommendations Insulin - Basal: Please increase Lantus to 45 units QHS.  Thanks, Orlando Penner, RN, MSN, CCRN Diabetes Coordinator Inpatient Diabetes Program [phone]

## 2012-07-11 NOTE — Progress Notes (Signed)
   CARE MANAGEMENT NOTE 07/11/2012  Patient:  [name], [name]   Account Number:  [id number]  Date Initiated:  07/05/2012  Documentation initiated by:  Alvira Philips Assessment:   62 yr-old female adm with dx of DKA; lives with son     Action/Plan:   Anticipated DC Date:  07/11/2012   Anticipated DC Plan:  HOME/SELF CARE      DC Planning Services  CM consult      Choice offered to / List presented to:             Status of service:  Completed, signed off Medicare Important Message given?   (If response is "NO", the following Medicare IM given date fields will be blank) Date Medicare IM given:   Date Additional Medicare IM given:    Discharge Disposition:  HOME/SELF CARE  Per UR Regulation:  Reviewed for med. necessity/level of care/duration of stay  If discussed at Long Length of Stay Meetings, dates discussed:    Comments:  PCP:  Dr Fleet Contras [facility]  07/11/12 Letha Cape RN, BSN [phone] patient lives with her son,pta indep.  Patient is for dc today, she has medication coverage and transportation. Patient also has sleep study scheduled and pulmonary apt scheduled.

## 2012-07-11 NOTE — Progress Notes (Signed)
NURSING PROGRESS NOTE  [name] [id number] Discharge Data: 07/11/2012 1:29 PM Attending Provider: Clydia Llano, MD MWN:[id number], Provider, MD     [name] to be D/C'd Home per MD order.  Discussed with the patient the After Visit Summary and all questions fully answered. All IV's discontinued with no bleeding noted. All belongings returned to patient for patient to take home.   Last Vital Signs:  Blood pressure 132/73, pulse 84, temperature 98.2 F (36.8 C), temperature source Oral, resp. rate 1, height 5\' 7"  (1.702 m), weight 148.6 kg (327 lb 9.7 oz), SpO2 97.00%.  Discharge Medication List   Medication List    STOP taking these medications       amLODipine 5 MG tablet  Commonly known as:  NORVASC     lisinopril-hydrochlorothiazide 20-12.5 MG per tablet  Commonly known as:  PRINZIDE,ZESTORETIC     multivitamin with minerals Tabs     OVER THE COUNTER MEDICATION      TAKE these medications       albuterol 108 (90 BASE) MCG/ACT inhaler  Commonly known as:  PROVENTIL HFA;VENTOLIN HFA  Inhale 1-2 puffs into the lungs every 6 (six) hours as needed for wheezing.     aspirin 81 MG EC tablet  Take 1 tablet (81 mg total) by mouth daily.     Fluticasone-Salmeterol 250-50 MCG/DOSE Aepb  Commonly known as:  ADVAIR  Inhale 1 puff into the lungs every 12 (twelve) hours.     freestyle lancets  Use as instructed     gemfibrozil 600 MG tablet  Commonly known as:  LOPID  Take 1 tablet (600 mg total) by mouth 2 (two) times daily before a meal.     glucose monitoring kit monitoring kit  1 each by Does not apply route as needed for other. Please check your sugars 3-4 times a day, and make a record of the reading, and take it to your Primary MD at your next follow up visit     insulin aspart 100 UNIT/ML injection  Commonly known as:  novoLOG  Inject 18 Units into the skin 3 (three) times daily with meals.     insulin aspart 100 UNIT/ML Sopn FlexPen  Commonly known  as:  NOVOLOG FLEXPEN  Inject 18 Units into the skin 3 (three) times daily with meals.     Insulin Glargine 100 UNIT/ML Sopn  Commonly known as:  LANTUS SOLOSTAR  Inject 40 Units into the skin at bedtime.     Insulin Pen Needle 32G X 4 MM Misc  Please use accordingly to inject insulin     lisinopril 20 MG tablet  Commonly known as:  PRINIVIL,ZESTRIL  Take 1 tablet (20 mg total) by mouth daily.     metFORMIN 500 MG tablet  Commonly known as:  GLUCOPHAGE  Take 1 tablet (500 mg total) by mouth 2 (two) times daily with a meal.

## 2012-08-05 ENCOUNTER — Encounter (HOSPITAL_BASED_OUTPATIENT_CLINIC_OR_DEPARTMENT_OTHER): Payer: Medicare Other

## 2012-08-06 ENCOUNTER — Institutional Professional Consult (permissible substitution): Payer: Medicare Other | Admitting: Pulmonary Disease

## 2012-08-07 ENCOUNTER — Other Ambulatory Visit (HOSPITAL_COMMUNITY): Payer: Self-pay | Admitting: Internal Medicine

## 2012-08-08 NOTE — Telephone Encounter (Signed)
Medication refill

## 2012-08-16 ENCOUNTER — Encounter: Payer: Medicare Other | Attending: Internal Medicine

## 2012-08-16 VITALS — Ht 67.0 in | Wt 314.4 lb

## 2012-08-16 DIAGNOSIS — Z713 Dietary counseling and surveillance: Secondary | ICD-10-CM | POA: Insufficient documentation

## 2012-08-16 DIAGNOSIS — E119 Type 2 diabetes mellitus without complications: Secondary | ICD-10-CM | POA: Insufficient documentation

## 2012-08-22 DIAGNOSIS — E119 Type 2 diabetes mellitus without complications: Secondary | ICD-10-CM

## 2012-08-22 NOTE — Progress Notes (Signed)
Patient was seen on 08/22/12 for the second of a series of three diabetes self-management courses at the [facility]. The following learning objectives were met by the patient during this course:   Explain basic nutrition maintenance and quality assurance  Describe causes, symptoms and treatment of hypoglycemia and hyperglycemia  Explain how to manage diabetes during illness  Describe the importance of good nutrition for health and healthy eating strategies  List strategies to follow meal plan when dining out  Describe the effects of alcohol on glucose and how to use it safely  Describe problem solving skills for day-to-day glucose challenges  Describe strategies to use when treatment plan needs to change  Identify important factors involved in successful weight loss  Describe ways to remain physically active  Describe the impact of regular activity on insulin resistance  Identify current diabetes medications, their action on blood glucose, and [pssible side effects.  Handouts given in class:  Refrigerator magnet for Sick Day Guidelines  [facility] Oral medication/insulin handout  Your patient has identified their diabetes self-care support plan as:  [facility] support group   Follow-Up Plan: Patient will attend the final class of the ADA Diabetes Self-Care Education.  

## 2012-08-23 NOTE — Patient Instructions (Signed)
Goals:  Follow Diabetes Meal Plan as instructed  Eat 3 meals and 2 snacks, every 3-5 hrs  Limit carbohydrate intake to 45-60 grams carbohydrate/meal  Limit carbohydrate intake to 0-30 grams carbohydrate/snack  Add lean protein foods to meals/snacks  Monitor glucose levels as instructed by your doctor  Aim for 15 mins of physical activity daily  Bring food record and glucose log to your next nutrition visit

## 2012-08-23 NOTE — Progress Notes (Signed)
Patient was seen on 08/16/12 for the first of a series of three diabetes self-management courses at the [facility].   Current HbA1c: 13.2 % on 07/04/12  The following learning objectives were met by the patient during this course:   Defines the role of glucose and insulin  Identifies type of diabetes and pathophysiology  Defines the diagnostic criteria for diabetes and prediabetes  States the risk factors for Type 2 Diabetes  States the symptoms of Type 2 Diabetes  Defines Type 2 Diabetes treatment goals  Defines Type 2 Diabetes treatment options  States the rationale for glucose monitoring  Identifies A1C, glucose targets, and testing times  Identifies proper sharps disposal  Defines the purpose of a diabetes food plan  Identifies carbohydrate food groups  Defines effects of carbohydrate foods on glucose levels  Identifies carbohydrate choices/grams/food labels  States benefits of physical activity and effect on glucose  Review of suggested activity guidelines  Handouts given during class include:  Type 2 Diabetes: Basics Book  My Food Plan Book  Food and Activity Log  Your patient has identified their diabetes self-care support plan as:  [facility] support group  Continued diabetes education  Follow-Up Plan: Attend core 2 and core 3

## 2012-08-27 ENCOUNTER — Encounter (HOSPITAL_BASED_OUTPATIENT_CLINIC_OR_DEPARTMENT_OTHER): Payer: Medicare Other

## 2012-08-31 DIAGNOSIS — E119 Type 2 diabetes mellitus without complications: Secondary | ICD-10-CM

## 2012-09-06 ENCOUNTER — Institutional Professional Consult (permissible substitution): Payer: Medicare Other | Admitting: Pulmonary Disease

## 2012-09-10 NOTE — Progress Notes (Signed)
Patient was seen on 08/31/12 for the third of a series of three diabetes self-management courses at the [facility]. The following learning objectives were met by the patient during this course:    Describe how diabetes changes over time   Identify diabetes complications and ways to prevent them   Describe strategies that can promote heart health including lowering blood pressure and cholesterol   Describe strategies to lower dietary fat and sodium in the diet   Identify physical activities that benefit cardiovascular health   Describe role of stress on blood glucose and develop strategies to address psychosocial issues   Evaluate success in meeting personal goal   Describe the belief that they can live successfully with diabetes day to day   Establish 2-3 goals that they will plan to diligently work on until they return for the free 71-month follow-up visit  The following handouts were given in class:  Goal setting handout  Class evaluation form  Low-sodium seasoning tips  Stress management handout  Your patient has established the following 4 month goals for diabetes self-care:  Try to manage stress better  Test my glucose at least 3 times a day, 7 days a week  Your patient has identified these potential barriers to change:  None stated  Your patient has identified their diabetes self-care support plan as:  [facility] support group available   Follow-Up Plan: Patient was offered a 4 month follow-up visit for diabetes self-management education.

## 2012-10-02 ENCOUNTER — Other Ambulatory Visit (HOSPITAL_COMMUNITY): Payer: Self-pay | Admitting: Internal Medicine

## 2013-05-20 ENCOUNTER — Encounter: Payer: Self-pay | Admitting: Internal Medicine

## 2013-05-20 ENCOUNTER — Ambulatory Visit (INDEPENDENT_AMBULATORY_CARE_PROVIDER_SITE_OTHER)
Admission: RE | Admit: 2013-05-20 | Discharge: 2013-05-20 | Disposition: A | Discharge status: Home / Self Care | Payer: Medicare Other | Source: Ambulatory Visit | Attending: Internal Medicine | Admitting: Internal Medicine

## 2013-05-20 ENCOUNTER — Ambulatory Visit (INDEPENDENT_AMBULATORY_CARE_PROVIDER_SITE_OTHER): Payer: Medicare Other | Admitting: Internal Medicine

## 2013-05-20 VITALS — BP 142/90 | HR 80 | Temp 98.2°F | Ht 67.0 in | Wt 314.0 lb

## 2013-05-20 DIAGNOSIS — R0602 Shortness of breath: Secondary | ICD-10-CM

## 2013-05-20 DIAGNOSIS — R058 Other specified cough: Secondary | ICD-10-CM | POA: Insufficient documentation

## 2013-05-20 DIAGNOSIS — I119 Hypertensive heart disease without heart failure: Secondary | ICD-10-CM

## 2013-05-20 DIAGNOSIS — R059 Cough, unspecified: Secondary | ICD-10-CM

## 2013-05-20 DIAGNOSIS — R05 Cough: Secondary | ICD-10-CM

## 2013-05-20 MED ORDER — OLMESARTAN-AMLODIPINE-HCTZ 40-10-25 MG PO TABS: ORAL_TABLET | ORAL | Status: DC

## 2013-05-20 NOTE — Assessment & Plan Note (Addendum)
The most common causes of chronic cough in immunocompetent adults include the following: upper airway cough syndrome (UACS), previously referred to as postnasal drip syndrome (PNDS), which is caused by variety of rhinosinus conditions; (2) asthma; (3) GERD; (4) chronic bronchitis from cigarette smoking or other inhaled environmental irritants; (5) nonasthmatic eosinophilic bronchitis; and (6) bronchiectasis.   These conditions, singly or in combination, have accounted for up to 94% of the causes of chronic cough in prospective studies.   Other conditions have constituted no >6% of the causes in prospective studies These have included bronchogenic carcinoma, chronic interstitial pneumonia, sarcoidosis, left ventricular failure, ACEI-induced cough, and aspiration from a condition associated with pharyngeal dysfunction.    Chronic cough is often simultaneously caused by more than one condition. A single cause has been found from 38 to 82% of the time, multiple causes from 18 to 62%. Multiply caused cough has been the result of three diseases up to 42% of the time.       Based on hx and exam, this is most likely:  Classic Upper airway cough syndrome, so named because it's frequently impossible to sort out how much is  CR/sinusitis with freq throat clearing (which can be related to primary GERD)   vs  causing  secondary (" extra esophageal")  GERD from wide swings in gastric pressure that occur with throat clearing, often  promoting self use of mint and menthol lozenges that reduce the lower esophageal sphincter tone and exacerbate the problem further in a cyclical fashion.   These are the same pts (now being labeled as having "irritable larynx syndrome" by some cough centers) who not infrequently have a history of having failed to tolerate ace inhibitors or  dry powder inhalers (which may prove to be the case here),or biphosphonates or report having atypical reflux symptoms that don't respond to standard  doses of PPI , and are easily confused as having aecopd or asthma flares by even experienced allergists/ pulmonologists.   The first step is to stop ACEi, maintain off advair and conitnue  maximize acid suppression/gerd diet and regroup in 6 weeks  See instructions for specific recommendations which were reviewed directly with the patient who was given a copy with highlighter outlining the key components.

## 2013-05-20 NOTE — Patient Instructions (Addendum)
Leave off advair completely and the lisinopril and amlodipine and and hydroduril  Start tribenzor 40/10/25 one daily    Only use your albuterol as a rescue medication to be used if you can't catch your breath by resting or doing a relaxed purse lip breathing pattern.  - The less you use it, the better it will work when you need it. - Ok to use up to 2 puffs  every 4 hours if you must but call for immediate appointment if use goes up over your usual need - Don't leave home without it !!  (think of it like the spare tire for your car)   Please schedule a follow up office visit in 6 weeks, call sooner if needed with pfts on return

## 2013-05-20 NOTE — Progress Notes (Signed)
   Subjective:    Patient ID: [name], female    DOB: 08/25/1950   MRN: [id number]  HPI   [age] yobf never smoker with breathing problems around 2008  eval by [name] never told she had asthma but better p inhaler but stopped it then again in same problem 2014 eval at  [name] maint on advair 250 and alb since then using it daily but ? Really needs it stopped 3 day prior to first ov and beter off advair when [facility] by [name] 05/20/2013    05/20/2013 1st [name] office visit/ [name] Chief Complaint  Patient presents with  . White Consult    Referred per Dr. Shirlean Mylararol White. Pt c/o SOB for the past 2 years- esp when she first gets up in the am. She states also has non prod cough at night- cough drops help.     prev using saba up 2-3 daily but no need since stopped advair, still on ACEi and already on PPI daily   No obvious other patterns in day to day or daytime variabilty or assoc  cp or chest tightness, subjective wheeze overt sinus or hb symptoms. No unusual exp hx or h/o childhood pna/ asthma or knowledge of premature birth.    Also denies any obvious fluctuation of symptoms with weather or environmental changes or other aggravating or alleviating factors except as outlined above   Current Medications, Allergies, Complete Past Medical History, Past Surgical History, Family History, and Social History were reviewed in [name] electronic medical record.              Review of Systems  Constitutional: Negative for fever, chills and unexpected weight change.  HENT: Negative for congestion, dental problem, ear pain, nosebleeds, postnasal drip, rhinorrhea, sinus pressure, sneezing, sore throat, trouble swallowing and voice change.   Eyes: Negative for visual disturbance.  Respiratory: Positive for cough and shortness of breath. Negative for choking.   Cardiovascular: Negative for chest pain and leg swelling.  Gastrointestinal: Negative for  vomiting, abdominal pain and diarrhea.  Genitourinary: Negative for difficulty urinating.  Musculoskeletal: Negative for arthralgias.  Skin: Negative for rash.  Neurological: Negative for tremors, syncope and headaches.  Hematological: Does not bruise/bleed easily.       Objective:   Physical Exam Wt Readings from Last 3 Encounters:  05/20/13 314 lb (142.429 kg)  08/23/12 314 lb 6.4 oz (142.611 kg)  07/10/12 327 lb 9.7 oz (148.6 kg)      HEENT: nl dentition, turbinates, and orophanx. Nl external ear canals without cough reflex   NECK :  without JVD/Nodes/TM/ nl carotid upstrokes bilaterally   LUNGS: no acc muscle use, clear to A and P bilaterally without cough on insp or exp maneuvers   CV:  RRR  no s3 or murmur or increase in P2, no edema   ABD:  soft and nontender with nl excursion in the supine position. No bruits or organomegaly, bowel sounds nl  MS:  warm without deformities, calf tenderness, cyanosis or clubbing  SKIN: warm and dry without lesions    NEURO:  alert, approp, no deficits   CXR  05/20/2013 :    The heart size and mediastinal contours are within normal limits.  Both lungs are clear. The visualized skeletal structures are  unremarkable.           Assessment & Plan:

## 2013-05-20 NOTE — Assessment & Plan Note (Signed)
ACE inhibitors are problematic in  pts with airway complaints because  even experienced pulmonologists can't always distinguish ace effects from copd/asthma.  By themselves they don't actually cause a problem, much like oxygen can't by itself start a fire, but they certainly serve as a powerful catalyst or enhancer for any "fire"  or inflammatory process in the upper airway, be it caused by an ET  tube or more commonly reflux (especially in the obese or pts with known GERD or who are on biphoshonates).    In the era of ARB near equivalency until we have a better handle on the reversibility of the airway problem, it just makes sense to avoid ACEI  entirely in the short run and then decide later, having established a level of airway control using a reasonable limited regimen, whether to add back ace but even then being very careful to observe the pt for worsening airway control and number of meds used/ needed to control symptoms.    For now rec Change acei/amlodipine/hctz to tribenzor 40/10/25

## 2013-05-21 NOTE — Progress Notes (Signed)
Quick Note:  Spoke with pt and notified of results per Dr. Wert. Pt verbalized understanding and denied any questions.  ______ 

## 2013-07-02 ENCOUNTER — Ambulatory Visit: Payer: Medicare Other | Admitting: Internal Medicine

## 2014-03-22 ENCOUNTER — Encounter (HOSPITAL_COMMUNITY): Payer: Self-pay | Admitting: Emergency Medicine

## 2014-03-22 ENCOUNTER — Emergency Department (INDEPENDENT_AMBULATORY_CARE_PROVIDER_SITE_OTHER)
Admission: EM | Admit: 2014-03-22 | Discharge: 2014-03-22 | Disposition: A | Discharge status: Home / Self Care | Payer: Medicare Other | Source: Home / Self Care | Attending: Emergency Medicine | Admitting: Emergency Medicine

## 2014-03-22 DIAGNOSIS — J209 Acute bronchitis, unspecified: Secondary | ICD-10-CM

## 2014-03-22 MED ORDER — HYDROCODONE-HOMATROPINE 5-1.5 MG/5ML PO SYRP: 5.0000 mL | ORAL_SOLUTION | Freq: Four times a day (QID) | ORAL | Status: DC | PRN

## 2014-03-22 MED ORDER — ALBUTEROL SULFATE (2.5 MG/3ML) 0.083% IN NEBU
INHALATION_SOLUTION | RESPIRATORY_TRACT | Status: AC
  Filled 2014-03-22: qty 3

## 2014-03-22 MED ORDER — ALBUTEROL SULFATE (2.5 MG/3ML) 0.083% IN NEBU
2.5000 mg | INHALATION_SOLUTION | Freq: Once | RESPIRATORY_TRACT | Status: AC
  Administered 2014-03-22: 2.5 mg via RESPIRATORY_TRACT

## 2014-03-22 MED ORDER — IPRATROPIUM-ALBUTEROL 0.5-2.5 (3) MG/3ML IN SOLN
RESPIRATORY_TRACT | Status: AC
  Filled 2014-03-22: qty 3

## 2014-03-22 MED ORDER — IPRATROPIUM-ALBUTEROL 0.5-2.5 (3) MG/3ML IN SOLN
3.0000 mL | Freq: Once | RESPIRATORY_TRACT | Status: AC
  Administered 2014-03-22: 3 mL via RESPIRATORY_TRACT

## 2014-03-22 MED ORDER — PREDNISONE 50 MG PO TABS: ORAL_TABLET | ORAL | Status: DC

## 2014-03-22 MED ORDER — AZITHROMYCIN 250 MG PO TABS: ORAL_TABLET | ORAL | Status: DC

## 2014-03-22 NOTE — Discharge Instructions (Signed)
You have bronchitis. Take the prednisone and Z-Pak as prescribed. Use your albuterol inhaler every 4-6 hours for shortness of breath. Use the Hycodan cough syrup at night. This has a narcotic in it so do not take it if driving. Make sure you are taking your blood pressure and diabetes medications. The prednisone can raise your blood sugar a little bit. If you are not improving in the next 2-3 days, please come back or see your regular doctor.

## 2014-03-22 NOTE — ED Provider Notes (Addendum)
CSN: [id number]     Arrival date & time 03/22/14  1333 History   First MD Initiated Contact with Patient 03/22/14 1447     Chief Complaint  Patient presents with  . Shortness of Breath   (Consider location/radiation/quality/duration/timing/severity/associated sxs/prior Treatment) HPI  She is a 64 year old woman here for evaluation of cough and shortness of breath. She states for the last 3 weeks she has had chest congestion and cough. It has gradually been getting worse. She reports subjective fevers. No nasal congestion or rhinorrhea. She does describe an intermittent sore throat. She reports shortness of breath and wheezing. She tried using an albuterol inhaler the last few days, but states that seems to have made it worse. No nausea or vomiting. No chest pain. She is eating and drinking well.  She did not take her blood pressure medicines this morning.  Past Medical History  Diagnosis Date  . Osteoarthritis   . Hypertension   . Morbid obesity   . Diabetes mellitus without complication    Past Surgical History  Procedure Laterality Date  . Cesarean section      x 3   Family History  Problem Relation Age of Onset  . Hypertension Mother   . Allergies Daughter   . Allergies Sister   . Asthma Daughter    History  Substance Use Topics  . Smoking status: Never Smoker   . Smokeless tobacco: Never Used  . Alcohol Use: No   OB History    No data available     Review of Systems  Constitutional: Positive for fever. Negative for chills and appetite change.  HENT: Positive for sore throat. Negative for congestion, rhinorrhea and trouble swallowing.   Respiratory: Positive for cough, shortness of breath and wheezing.   Cardiovascular: Negative for chest pain.  Gastrointestinal: Negative for nausea and vomiting.  Musculoskeletal: Negative for myalgias.  Neurological: Negative for headaches.    Allergies  Review of patient's allergies indicates no known allergies.  Home  Medications   Prior to Admission medications   Medication Sig Start Date End Date Taking? Authorizing Provider  albuterol (PROVENTIL HFA;VENTOLIN HFA) 108 (90 BASE) MCG/ACT inhaler Inhale 1-2 puffs into the lungs every 6 (six) hours as needed for wheezing. 01/22/12   Nelva Nayobert Beaton, MD  amLODipine (NORVASC) 5 MG tablet Take 5 mg by mouth daily.    Historical Provider, MD  aspirin EC 81 MG EC tablet Take 1 tablet (81 mg total) by mouth daily. 07/10/12   Shanker Levora DredgeM Ghimire, MD  azithromycin (ZITHROMAX Z-PAK) 250 MG tablet Take 2 pills today, then 1 pill daily until gone. 03/22/14   Charm RingsErin J Levon Penning, MD  BIOTIN PO Take 500 mcg by mouth daily.    Historical Provider, MD  glimepiride (AMARYL) 4 MG tablet Take 4 mg by mouth daily with breakfast.    Historical Provider, MD  hydrochlorothiazide (MICROZIDE) 12.5 MG capsule Take 12.5 mg by mouth daily.    Historical Provider, MD  HYDROcodone-homatropine (HYCODAN) 5-1.5 MG/5ML syrup Take 5 mLs by mouth every 6 (six) hours as needed for cough. 03/22/14   Charm RingsErin J Allyiah Gartner, MD  lisinopril (PRINIVIL,ZESTRIL) 40 MG tablet Take 40 mg by mouth daily.    Historical Provider, MD  metFORMIN (GLUCOPHAGE) 500 MG tablet Take 1 tablet (500 mg total) by mouth 2 (two) times daily with a meal. 07/11/12   Clydia LlanoMutaz Elmahi, MD  Olmesartan-Amlodipine-HCTZ [facility](TRIBENZOR) 40-10-25 MG TABS One daily 05/20/13   Nyoka CowdenMichael B Wert, MD  pantoprazole (PROTONIX) 40 MG tablet Take  40 mg by mouth daily.    Historical Provider, MD  predniSONE (DELTASONE) 50 MG tablet Take 1 pill daily for 5 days. 03/22/14   Charm Rings, MD  UNABLE TO FIND Med Name: [facility] otc vitamin  1 with each meal    Historical Provider, MD   BP 186/125 mmHg  Pulse 114  Temp(Src) 97.4 F (36.3 C) (Oral)  Resp 28  SpO2 93% Physical Exam  Constitutional: She is oriented to person, place, and time. She appears well-developed and well-nourished. She appears distressed (tachypneic).  HENT:  Head: Normocephalic and atraumatic.  Right Ear:  Tympanic membrane and external ear normal.  Left Ear: Tympanic membrane and external ear normal.  Nose: Nose normal.  Mouth/Throat: Oropharynx is clear and moist. No oropharyngeal exudate.  Eyes: Conjunctivae are normal.  Neck: Neck supple.  Cardiovascular: Regular rhythm and normal heart sounds.  Tachycardia present.   No murmur heard. Pulmonary/Chest: She is in respiratory distress (Tachypneic). She has wheezes. She has no rales.  Poor air movement with scattered wheezes.  Lymphadenopathy:    She has no cervical adenopathy.  Neurological: She is alert and oriented to person, place, and time.  Vitals reviewed.   ED Course  Procedures (including critical care time) Labs Review Labs Reviewed - No data to display  Imaging Review No results found.   MDM   1. Acute bronchitis, unspecified organism    DuoNeb 0.5-5 mg given. Air movement much improved. No wheezes or rales.  O2 sat now 97% on room air.  Will treat with a Z-Pak and prednisone. Recommended using her albuterol inhaler every 4-6 hours. We discussed how to use the inhaler as well. Hycodan prescription to use as needed for cough. Discussed that prednisone can raise her blood sugar. Follow-up in 2-3 days if no improvement.    Charm Rings, MD 03/22/14 1540  Charm Rings, MD 03/22/14 [phone]

## 2014-03-22 NOTE — ED Notes (Signed)
Pt states that she has had some shortness of breath from her asthma for 3 weeks.

## 2014-04-29 ENCOUNTER — Ambulatory Visit: Payer: Medicare Other | Admitting: Internal Medicine

## 2014-05-13 ENCOUNTER — Ambulatory Visit (INDEPENDENT_AMBULATORY_CARE_PROVIDER_SITE_OTHER): Payer: Medicare Other | Admitting: Internal Medicine

## 2014-05-13 ENCOUNTER — Encounter: Payer: Self-pay | Admitting: Internal Medicine

## 2014-05-13 VITALS — BP 106/80 | HR 96 | Ht 67.0 in | Wt 306.0 lb

## 2014-05-13 DIAGNOSIS — R0602 Shortness of breath: Secondary | ICD-10-CM

## 2014-05-13 DIAGNOSIS — I10 Essential (primary) hypertension: Secondary | ICD-10-CM

## 2014-05-13 MED ORDER — AMLODIPINE-OLMESARTAN 5-20 MG PO TABS: 1.0000 | ORAL_TABLET | Freq: Every day | ORAL | Status: DC

## 2014-05-13 NOTE — Patient Instructions (Addendum)
Stop amlodipine and lisinopril  Azor 5/20 one daily until return  Change the dulera to where you take it only when you need it up to  2puffsevery 12 hours as needed for breathing   Change the water pill to where you just take it as needed take one daily as needed for swelling   See Tammy NP in 2 weeks with all your medications, even over the counter meds, separated in two separate bags, the ones you take no matter what vs the ones you stop once you feel better and take only as needed when you feel you need them.   Tammy  will generate for you a new user friendly medication calendar that will put us all on the same page re: your medication use.     Without this process, it simply isn't possible to assure that we are providing  your outpatient care  with  the attention to detail we feel you deserve.   If we cannot assure that you're getting that kind of care,  then we cannot manage your problem effectively from this clinic.  Once you have seen Tammy and we are sure that we're all on the same page with your medication use she will arrange follow up with me.

## 2014-05-13 NOTE — Progress Notes (Signed)
Subjective:    Patient ID: [name], female    DOB: May 15, 1950   MRN: [id number]     Brief patient profile:  [age] yobf never smoker with breathing problems around 2008  eval by Dr Petra KubaKilpatrick never told she had asthma but better p inhaler but stopped it then again in same problem [facility] and alb since then using it daily but ? Really needs it stopped 3 day prior to first ov and beter off advair when referred by Shirlean Mylararol Webb to pulmonary clinic 05/20/2013 on ACEi with dx of pseudoasthma.    History of Present Illness  05/20/2013 1st [facility] office visit/ [name] Chief Complaint  Patient presents with  . Pulmonary Consult    Referred per Dr. Shirlean Mylararol Webb. Pt c/o SOB for the past 2 years- esp when she first gets up in the am. She states also has non prod cough at night- cough drops help.     prev using saba up 2-3 daily but no need since stopped advair, still on ACEi and already on PPI daily  rec Leave off advair completely and the lisinopril and amlodipine and and hydroduril start tribenzor 40/10/25 one daily  Only use your albuterol as a rescue medication   Please schedule a follow up office visit in 6 weeks> did not return    05/13/2014 f/u ov/[name] re:  Chief Complaint  Patient presents with  . Follow-up    Pt c/o increased SOB for the past 2-3 months. Starting to improve after started taking Dulera inhaler.   back on acei and indolent onset progressive sob at rest and with slow adls since restarted though denies assoc cough and better on dulera   No obvious day to day or daytime variabilty or assoc excess or purulent mucus  or cp or chest tightness, subjective wheeze overt sinus or hb symptoms. No unusual exp hx or h/o childhood pna/ asthma or knowledge of premature birth.  Sleeping ok without nocturnal  or early am exacerbation  of respiratory  c/o's or need for noct saba. Also denies any obvious fluctuation of symptoms with weather or  environmental changes or other aggravating or alleviating factors except as outlined above   Current Medications, Allergies, Complete Past Medical History, Past Surgical History, Family History, and Social History were reviewed in [facility] electronic medical record.  ROS  The following are not active complaints unless bolded sore throat, dysphagia, dental problems, itching, sneezing,  nasal congestion or excess/ purulent secretions, ear ache,   fever, chills, sweats, unintended wt loss, pleuritic or exertional cp, hemoptysis,  orthopnea pnd or leg swelling, presyncope, palpitations, heartburn, abdominal pain, anorexia, nausea, vomiting, diarrhea  or change in bowel or urinary habits, change in stools or urine, dysuria,hematuria,  rash, arthralgias, visual complaints, headache, numbness weakness or ataxia or problems with walking or coordination,  change in mood/affect or memory.                            Objective:   Physical Exam Wt Readings from Last 3 Encounters:  05/20/13 314 lb (142.429 kg)  08/23/12 314 lb 6.4 oz (142.611 kg)  07/10/12 327 lb 9.7 oz (148.6 kg)     amb obese bf with ? Push of speech, very evasive with answers to specific questions re symptoms  HEENT: nl dentition, turbinates, and orophanx. Nl external ear canals without cough reflex   NECK :  without JVD/Nodes/TM/ nl carotid upstrokes bilaterally   LUNGS: no acc muscle use, clear to A and P bilaterally without cough on insp or exp maneuvers   CV:  RRR  no s3 or murmur or increase in P2, no edema   ABD:  soft and nontender with nl excursion in the supine position. No bruits or organomegaly, bowel sounds nl  MS:  warm without deformities, calf tenderness, cyanosis or clubbing  SKIN: warm and dry without lesions    NEURO:  alert, approp, no deficits   CXR  05/20/2013 :  images reviewed:   The heart size and mediastinal contours are within normal limits.  Both lungs are clear. The visualized  skeletal structures are  unremarkable.           Assessment & Plan:

## 2014-05-14 ENCOUNTER — Encounter: Payer: Self-pay | Admitting: Internal Medicine

## 2014-05-14 NOTE — Assessment & Plan Note (Signed)
Symptoms are markedly disproportionate to objective findings and not clear this is a lung problem but pt does appear to have difficult airway management issues.    DDX of  difficult airways management all start with A and  include Adherence, Ace Inhibitors, Acid Reflux, Active Sinus Disease, Alpha 1 Antitripsin deficiency, Anxiety masquerading as Airways dz,  ABPA,  allergy(esp in young), Aspiration (esp in elderly), Adverse effects of DPI,  Active smokers, plus two Bs  = Bronchiectasis and Beta blocker use..and one C= CHF   Adherence is always the initial "prime suspect" and is a multilayered concern that requires a "trust but verify" approach in every patient - starting with knowing how to use medications, especially inhalers, correctly, keeping up with refills and understanding the fundamental difference between maintenance and prns vs those medications only taken for a very short course and then stopped and not refilled.  - needs med reconciliation on return   ACEi at top of usual list of suspects  ? Acid (or non-acid) GERD > always difficult to exclude as up to 75% of pts in some series report no assoc GI/ Heartburn symptoms> rec continue max (24h)  acid suppression and diet restrictions/ reviewed      Each maintenance medication was reviewed in detail including most importantly the difference between maintenance and as needed and under what circumstances the prns are to be used.  Please see instructions for details which were reviewed in writing and the patient given a copy.

## 2014-05-14 NOTE — Assessment & Plan Note (Addendum)
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  By themselves they don't actually cause a problem, much like oxygen can't by itself start a fire, but they certainly serve as a powerful catalyst or enhancer for any "fire"  or inflammatory process in the upper airway, be it caused by an ET  tube or more commonly reflux (especially in the obese or pts with known GERD or who are on biphoshonates) or URI's, due to interference with bradykinin clearance.  The effects of acei on bradykinin levels occurs in 100% of pt's on acei (unless they surreptitiously stop the med!) but the classic cough is only reported in 5%.  This leaves 95% of pts on acei's  with a variety of syndromes including no identifiable symptom in most  vs non-specific symptoms that wax and wane depending on what other insult is occuring at the level of the upper airway.   Strongly rec she not be placed back on acei since hard to sort out all her different complaints and the only way to prove they are not acei related is trial off   For now rec restart azor 5/20 one daily and change the hctz to prn swelling  then regroup in 2 weeks    Desperately needs med reconciliation.  To keep things simple, I have asked the patient to first separate medicines that are perceived as maintenance, that is to be taken daily "no matter what", from those medicines that are taken on only on an as-needed basis and I have given the patient examples of both, and then return to see our NP to generate a  detailed  medication calendar which should be followed until the next physician sees the patient and updates it.

## 2014-05-27 ENCOUNTER — Encounter: Payer: Medicare Other | Admitting: Adult Health

## 2014-05-27 ENCOUNTER — Encounter: Payer: Self-pay | Admitting: Adult Health

## 2014-05-27 ENCOUNTER — Inpatient Hospital Stay (HOSPITAL_COMMUNITY): Payer: Medicare Other

## 2014-05-27 ENCOUNTER — Encounter (HOSPITAL_COMMUNITY): Payer: Self-pay | Admitting: Emergency Medicine

## 2014-05-27 ENCOUNTER — Emergency Department (HOSPITAL_COMMUNITY): Payer: Medicare Other

## 2014-05-27 ENCOUNTER — Inpatient Hospital Stay (HOSPITAL_COMMUNITY)
Admission: EM | Admit: 2014-05-27 | Discharge: 2014-06-02 | DRG: 175 | Disposition: A | Discharge status: Skilled Nursing Facility | Payer: Medicare Other | Attending: Internal Medicine | Admitting: Internal Medicine

## 2014-05-27 ENCOUNTER — Other Ambulatory Visit: Payer: Self-pay

## 2014-05-27 ENCOUNTER — Ambulatory Visit (INDEPENDENT_AMBULATORY_CARE_PROVIDER_SITE_OTHER): Payer: Medicare Other | Admitting: Adult Health

## 2014-05-27 VITALS — BP 124/84 | HR 121 | Ht 67.0 in | Wt 309.0 lb

## 2014-05-27 DIAGNOSIS — R0600 Dyspnea, unspecified: Secondary | ICD-10-CM | POA: Diagnosis present

## 2014-05-27 DIAGNOSIS — I272 Other secondary pulmonary hypertension: Secondary | ICD-10-CM | POA: Diagnosis present

## 2014-05-27 DIAGNOSIS — R0902 Hypoxemia: Secondary | ICD-10-CM

## 2014-05-27 DIAGNOSIS — Z7982 Long term (current) use of aspirin: Secondary | ICD-10-CM

## 2014-05-27 DIAGNOSIS — I2699 Other pulmonary embolism without acute cor pulmonale: Secondary | ICD-10-CM | POA: Diagnosis present

## 2014-05-27 DIAGNOSIS — D65 Disseminated intravascular coagulation [defibrination syndrome]: Secondary | ICD-10-CM | POA: Diagnosis present

## 2014-05-27 DIAGNOSIS — I129 Hypertensive chronic kidney disease with stage 1 through stage 4 chronic kidney disease, or unspecified chronic kidney disease: Secondary | ICD-10-CM | POA: Diagnosis present

## 2014-05-27 DIAGNOSIS — I5022 Chronic systolic (congestive) heart failure: Secondary | ICD-10-CM | POA: Diagnosis present

## 2014-05-27 DIAGNOSIS — Z79899 Other long term (current) drug therapy: Secondary | ICD-10-CM

## 2014-05-27 DIAGNOSIS — R0602 Shortness of breath: Secondary | ICD-10-CM

## 2014-05-27 DIAGNOSIS — R000 Tachycardia, unspecified: Secondary | ICD-10-CM | POA: Diagnosis not present

## 2014-05-27 DIAGNOSIS — R579 Shock, unspecified: Secondary | ICD-10-CM | POA: Diagnosis present

## 2014-05-27 DIAGNOSIS — I2692 Saddle embolus of pulmonary artery without acute cor pulmonale: Secondary | ICD-10-CM | POA: Diagnosis present

## 2014-05-27 DIAGNOSIS — Z87891 Personal history of nicotine dependence: Secondary | ICD-10-CM | POA: Diagnosis not present

## 2014-05-27 DIAGNOSIS — I10 Essential (primary) hypertension: Secondary | ICD-10-CM | POA: Diagnosis not present

## 2014-05-27 DIAGNOSIS — D72829 Elevated white blood cell count, unspecified: Secondary | ICD-10-CM | POA: Diagnosis present

## 2014-05-27 DIAGNOSIS — F419 Anxiety disorder, unspecified: Secondary | ICD-10-CM | POA: Diagnosis present

## 2014-05-27 DIAGNOSIS — N179 Acute kidney failure, unspecified: Secondary | ICD-10-CM | POA: Insufficient documentation

## 2014-05-27 DIAGNOSIS — I517 Cardiomegaly: Secondary | ICD-10-CM | POA: Diagnosis not present

## 2014-05-27 DIAGNOSIS — N183 Chronic kidney disease, stage 3 (moderate): Secondary | ICD-10-CM | POA: Diagnosis present

## 2014-05-27 DIAGNOSIS — Z6841 Body Mass Index (BMI) 40.0 and over, adult: Secondary | ICD-10-CM | POA: Diagnosis not present

## 2014-05-27 DIAGNOSIS — E119 Type 2 diabetes mellitus without complications: Secondary | ICD-10-CM | POA: Diagnosis present

## 2014-05-27 DIAGNOSIS — Z8249 Family history of ischemic heart disease and other diseases of the circulatory system: Secondary | ICD-10-CM | POA: Diagnosis not present

## 2014-05-27 DIAGNOSIS — R0682 Tachypnea, not elsewhere classified: Secondary | ICD-10-CM

## 2014-05-27 LAB — I-STAT TROPONIN, ED: Troponin i, poc: 0.03 ng/mL (ref 0.00–0.08)

## 2014-05-27 LAB — BASIC METABOLIC PANEL
Anion gap: 14 (ref 5–15)
BUN: 26 mg/dL — AB (ref 6–20)
CALCIUM: 9.2 mg/dL (ref 8.9–10.3)
CO2: 19 mmol/L — ABNORMAL LOW (ref 22–32)
Chloride: 102 mmol/L (ref 101–111)
Creatinine, Ser: 1.28 mg/dL — ABNORMAL HIGH (ref 0.44–1.00)
GFR calc Af Amer: 50 mL/min — ABNORMAL LOW (ref >60)
GFR calc non Af Amer: 43 mL/min — ABNORMAL LOW (ref >60)
Glucose, Bld: 268 mg/dL — ABNORMAL HIGH (ref 65–99)
POTASSIUM: 4.4 mmol/L (ref 3.5–5.1)
SODIUM: 135 mmol/L (ref 135–145)

## 2014-05-27 LAB — URINALYSIS, ROUTINE W REFLEX MICROSCOPIC
GLUCOSE, UA: NEGATIVE mg/dL
Hgb urine dipstick: NEGATIVE
Ketones, ur: 15 mg/dL — AB
LEUKOCYTES UA: NEGATIVE
Nitrite: NEGATIVE
PH: 5.5 (ref 5.0–8.0)
Protein, ur: >300 mg/dL — AB
SPECIFIC GRAVITY, URINE: 1.025 (ref 1.005–1.030)
UROBILINOGEN UA: 1 mg/dL (ref 0.0–1.0)

## 2014-05-27 LAB — CBC
HCT: 34.9 % — ABNORMAL LOW (ref 36.0–46.0)
HCT: 36.7 % (ref 36.0–46.0)
Hemoglobin: 11.2 g/dL — ABNORMAL LOW (ref 12.0–15.0)
Hemoglobin: 11.5 g/dL — ABNORMAL LOW (ref 12.0–15.0)
MCH: 23.5 pg — ABNORMAL LOW (ref 26.0–34.0)
MCH: 23.4 pg — ABNORMAL LOW (ref 26.0–34.0)
MCHC: 32.1 g/dL (ref 30.0–36.0)
MCHC: 31.3 g/dL (ref 30.0–36.0)
MCV: 73.2 fL — ABNORMAL LOW (ref 78.0–100.0)
MCV: 74.6 fL — ABNORMAL LOW (ref 78.0–100.0)
PLATELETS: 190 10*3/uL (ref 150–400)
Platelets: 129 10*3/uL — ABNORMAL LOW (ref 150–400)
RBC: 4.77 MIL/uL (ref 3.87–5.11)
RBC: 4.92 MIL/uL (ref 3.87–5.11)
RDW: 19.8 % — ABNORMAL HIGH (ref 11.5–15.5)
RDW: 20.3 % — ABNORMAL HIGH (ref 11.5–15.5)
WBC: 14.7 10*3/uL — ABNORMAL HIGH (ref 4.0–10.5)
WBC: 16.9 10*3/uL — AB (ref 4.0–10.5)

## 2014-05-27 LAB — URINE MICROSCOPIC-ADD ON

## 2014-05-27 LAB — TROPONIN I
TROPONIN I: 0.14 ng/mL — AB (ref <0.031)
Troponin I: 0.06 ng/mL — ABNORMAL HIGH (ref <0.031)

## 2014-05-27 LAB — CBG MONITORING, ED: GLUCOSE-CAPILLARY: 249 mg/dL — AB (ref 65–99)

## 2014-05-27 LAB — BRAIN NATRIURETIC PEPTIDE: B Natriuretic Peptide: 969.9 pg/mL — ABNORMAL HIGH (ref 0.0–100.0)

## 2014-05-27 LAB — FIBRINOGEN
FIBRINOGEN: 513 mg/dL — AB (ref 204–475)
FIBRINOGEN: 459 mg/dL (ref 204–475)

## 2014-05-27 LAB — TYPE AND SCREEN
ABO/RH(D): O NEG
ANTIBODY SCREEN: NEGATIVE

## 2014-05-27 LAB — PROTIME-INR
INR: 1.52 — ABNORMAL HIGH (ref 0.00–1.49)
Prothrombin Time: 18.3 seconds — ABNORMAL HIGH (ref 11.6–15.2)

## 2014-05-27 LAB — GLUCOSE, CAPILLARY: Glucose-Capillary: 312 mg/dL — ABNORMAL HIGH (ref 65–99)

## 2014-05-27 LAB — ABO/RH: ABO/RH(D): O NEG

## 2014-05-27 LAB — MRSA PCR SCREENING: MRSA BY PCR: NEGATIVE

## 2014-05-27 LAB — HEPARIN LEVEL (UNFRACTIONATED): Heparin Unfractionated: 0.65 IU/mL (ref 0.30–0.70)

## 2014-05-27 LAB — ANTITHROMBIN III: AntiThromb III Func: 114 % (ref 75–120)

## 2014-05-27 MED ORDER — HYDROMORPHONE HCL 1 MG/ML IJ SOLN: 0.5000 mg | Freq: Once | INTRAMUSCULAR | Status: DC

## 2014-05-27 MED ORDER — DEXTROSE 5 % IV SOLN
1.0000 g | Freq: Once | INTRAVENOUS | Status: AC
  Administered 2014-05-27: 1 g via INTRAVENOUS
  Filled 2014-05-27: qty 10

## 2014-05-27 MED ORDER — ALPRAZOLAM 0.25 MG PO TABS
0.2500 mg | ORAL_TABLET | ORAL | Status: DC | PRN
  Administered 2014-05-27 – 2014-05-31 (×3): 0.25 mg via ORAL
  Filled 2014-05-27 (×3): qty 1

## 2014-05-27 MED ORDER — ONDANSETRON HCL 4 MG/2ML IJ SOLN
4.0000 mg | Freq: Once | INTRAMUSCULAR | Status: AC
  Administered 2014-05-27: 4 mg via INTRAVENOUS
  Filled 2014-05-27: qty 2

## 2014-05-27 MED ORDER — LIDOCAINE HCL 1 % IJ SOLN
INTRAMUSCULAR | Status: AC
  Filled 2014-05-27: qty 20

## 2014-05-27 MED ORDER — SODIUM CHLORIDE 0.9 % IV BOLUS (SEPSIS)
500.0000 mL | Freq: Once | INTRAVENOUS | Status: AC
  Administered 2014-05-27: 500 mL via INTRAVENOUS

## 2014-05-27 MED ORDER — HEPARIN (PORCINE) IN NACL 100-0.45 UNIT/ML-% IJ SOLN
1500.0000 [IU]/h | INTRAMUSCULAR | Status: AC
  Administered 2014-05-27 – 2014-05-28 (×2): 1500 [IU]/h via INTRAVENOUS
  Filled 2014-05-27 (×2): qty 250

## 2014-05-27 MED ORDER — FENTANYL CITRATE (PF) 100 MCG/2ML IJ SOLN
INTRAMUSCULAR | Status: AC | PRN
  Administered 2014-05-27: 25 ug via INTRAVENOUS

## 2014-05-27 MED ORDER — SODIUM CHLORIDE 0.9 % IV SOLN
INTRAVENOUS | Status: DC
  Administered 2014-05-28: 06:00:00 via INTRAVENOUS

## 2014-05-27 MED ORDER — SODIUM CHLORIDE 0.9 % IV SOLN
12.0000 mg | Freq: Once | INTRAVENOUS | Status: AC
  Administered 2014-05-27: 12 mg via INTRAVENOUS
  Filled 2014-05-27: qty 12

## 2014-05-27 MED ORDER — SODIUM CHLORIDE 0.9 % IV SOLN: 250.0000 mL | INTRAVENOUS | Status: DC | PRN

## 2014-05-27 MED ORDER — SODIUM CHLORIDE 0.9 % IJ SOLN
3.0000 mL | Freq: Two times a day (BID) | INTRAMUSCULAR | Status: DC
  Administered 2014-05-29 – 2014-06-02 (×5): 3 mL via INTRAVENOUS

## 2014-05-27 MED ORDER — BUPROPION HCL 100 MG PO TABS
100.0000 mg | ORAL_TABLET | Freq: Two times a day (BID) | ORAL | Status: DC
  Administered 2014-05-29: 100 mg via ORAL
  Filled 2014-05-27 (×12): qty 1

## 2014-05-27 MED ORDER — IOHEXOL 300 MG/ML  SOLN
50.0000 mL | Freq: Once | INTRAMUSCULAR | Status: AC | PRN
  Administered 2014-05-27: 30 mL via INTRAVENOUS

## 2014-05-27 MED ORDER — HEPARIN BOLUS VIA INFUSION
5000.0000 [IU] | Freq: Once | INTRAVENOUS | Status: AC
  Administered 2014-05-27: 5000 [IU] via INTRAVENOUS
  Filled 2014-05-27: qty 5000

## 2014-05-27 MED ORDER — DEXTROSE 5 % IV SOLN
500.0000 mg | Freq: Once | INTRAVENOUS | Status: AC
  Administered 2014-05-27: 500 mg via INTRAVENOUS
  Filled 2014-05-27: qty 500

## 2014-05-27 MED ORDER — SODIUM CHLORIDE 0.9 % IJ SOLN
3.0000 mL | INTRAMUSCULAR | Status: DC | PRN
  Administered 2014-05-31: 3 mL via INTRAVENOUS
  Filled 2014-05-27: qty 3

## 2014-05-27 MED ORDER — SODIUM CHLORIDE 0.9 % IV SOLN: INTRAVENOUS | Status: DC

## 2014-05-27 MED ORDER — SODIUM CHLORIDE 0.9 % IV SOLN
INTRAVENOUS | Status: DC
  Administered 2014-05-27 – 2014-05-30 (×4): via INTRAVENOUS

## 2014-05-27 MED ORDER — INSULIN ASPART 100 UNIT/ML ~~LOC~~ SOLN
0.0000 [IU] | SUBCUTANEOUS | Status: DC
  Administered 2014-05-27: 7 [IU] via SUBCUTANEOUS
  Administered 2014-05-27: 15 [IU] via SUBCUTANEOUS
  Administered 2014-05-28: 3 [IU] via SUBCUTANEOUS
  Administered 2014-05-29 – 2014-05-31 (×8): 4 [IU] via SUBCUTANEOUS
  Administered 2014-05-31: 3 [IU] via SUBCUTANEOUS
  Administered 2014-06-01: 4 [IU] via SUBCUTANEOUS
  Administered 2014-06-01: 3 [IU] via SUBCUTANEOUS
  Administered 2014-06-01 – 2014-06-02 (×3): 4 [IU] via SUBCUTANEOUS
  Administered 2014-06-02 (×2): 3 [IU] via SUBCUTANEOUS

## 2014-05-27 MED ORDER — IOHEXOL 350 MG/ML SOLN
100.0000 mL | Freq: Once | INTRAVENOUS | Status: AC | PRN
  Administered 2014-05-27: 100 mL via INTRAVENOUS

## 2014-05-27 MED ORDER — BUDESONIDE 0.25 MG/2ML IN SUSP
0.5000 mg | Freq: Two times a day (BID) | RESPIRATORY_TRACT | Status: DC
  Administered 2014-05-27 – 2014-06-01 (×10): 0.5 mg via RESPIRATORY_TRACT
  Filled 2014-05-27 (×18): qty 4

## 2014-05-27 MED ORDER — MIDAZOLAM HCL 2 MG/2ML IJ SOLN
INTRAMUSCULAR | Status: AC
  Filled 2014-05-27: qty 4

## 2014-05-27 MED ORDER — MIDAZOLAM HCL 2 MG/2ML IJ SOLN
INTRAMUSCULAR | Status: AC | PRN
  Administered 2014-05-27: 0.5 mg via INTRAVENOUS

## 2014-05-27 MED ORDER — ALBUTEROL SULFATE (2.5 MG/3ML) 0.083% IN NEBU
2.5000 mg | INHALATION_SOLUTION | RESPIRATORY_TRACT | Status: DC | PRN
  Administered 2014-05-27: 2.5 mg via RESPIRATORY_TRACT
  Filled 2014-05-27: qty 3

## 2014-05-27 MED ORDER — ARFORMOTEROL TARTRATE 15 MCG/2ML IN NEBU
15.0000 ug | INHALATION_SOLUTION | Freq: Two times a day (BID) | RESPIRATORY_TRACT | Status: DC
  Administered 2014-05-27 – 2014-06-02 (×10): 15 ug via RESPIRATORY_TRACT
  Filled 2014-05-27 (×18): qty 2

## 2014-05-27 MED ORDER — FENTANYL CITRATE (PF) 100 MCG/2ML IJ SOLN
INTRAMUSCULAR | Status: AC
  Filled 2014-05-27: qty 2

## 2014-05-27 NOTE — Sedation Documentation (Signed)
Per MD no sedation at this time, pt tolerating procedure well, extremely SOB

## 2014-05-27 NOTE — Procedures (Signed)
Interventional Radiology Procedure Note  Procedure: Pulmonary arteriogram and initiation of USAT bilaterally with EKOS.  Main PA pressure 74/39 (50) mmHg consistent with critical pulmonary HTN.  Access: Right CFV, 2x 55F sheath  Complications: None  Estimated Blood Loss: 0  Recommendations: - Bilateral PE lysis per protocol  Signed,  Sterling BigHeath K. McCullough, MD

## 2014-05-27 NOTE — Progress Notes (Signed)
ANTICOAGULATION CONSULT NOTE - Initial Consult  Pharmacy Consult for heparin Indication: pulmonary embolus  No Known Allergies  Patient Measurements: Height: 5\' 7"  (170.2 cm) Weight: (!) 309 lb (140.161 kg) IBW/kg (Calculated) : 61.6 Heparin Dosing Weight: 95.9kg  Vital Signs: Temp: 97.6 F (36.4 C) (05/24 1136) Temp Source: Oral (05/24 1136) BP: 90/68 mmHg (05/24 1507) Pulse Rate: 113 (05/24 1507)  Labs:  Recent Labs  05/27/14 1139  HGB 11.2*  HCT 34.9*  PLT 190  CREATININE 1.28*    Estimated Creatinine Clearance: 65.2 mL/min (by C-G formula based on Cr of 1.28).   Medical History: Past Medical History  Diagnosis Date  . Osteoarthritis   . Hypertension   . Morbid obesity   . Diabetes mellitus without complication     Medications:  Infusions:  . heparin      Assessment: [name] yof presented to the ED with SOB. CT reported with submassive acute PE with right heart strain. Baseline H/H is slightly low but platelets are WNL. She is not on any anticoagulation PTA.   Goal of Therapy:  Heparin level 0.3-0.7 units/ml Monitor platelets by anticoagulation protocol: Yes   Plan:  - Heparin bolus 5000 units IV x 1 - Heparin gtt 1500 units/hr - Check a 6 hour heparin level - Daily heparin level and CBC - F/u plans for oral anticoagulation  Aroldo Galli, Drake Leachachel Lynn 05/27/2014,3:43 PM

## 2014-05-27 NOTE — Consult Note (Signed)
Chief Complaint: Chief Complaint  Patient presents with  . Shortness of Breath    Referring Physician(s): Dr. Molli Knock, Pulmonary Critical Care  History of Present Illness: [name] is a 64 y.o. female with severe dyspnea, tachypnea and newly diagnoses large volume PE with saddle embolus and evidence of right heart strain (RV/LV ratio > 1.1).  Pt seen by PCCM and felt to be at high risk for decompensation given RR of 40 per minute and borderline hypotension.  IR consulted to evaluate for PE lysis.  Due to age, patient is considered moderate (intermediate) risk for bleeding with tPA.  No high risk factors.   She is clearly in respiratory distress and cannot speak in full sentences.     In ED, sats were less than 90% and she was placed on 2L O2 with improvement to 94%. O2 had to be increased to 4L after a few hours to maintain SpO2 > 92%.  She denies any hx of VTE, LE swelling, recent periods of prolonged immobilization, recent long travel, hx of malignancies, hx of clotting disorders, hemoptysis, hormonal use. She is a former smoker, only while she was in college. She does report that her maternal side had multiple members with "clots that went to their head and killed them". Unable to recall if these were in fact CVA's.   Past Medical History  Diagnosis Date  . Osteoarthritis   . Hypertension   . Morbid obesity   . Diabetes mellitus without complication     Past Surgical History  Procedure Laterality Date  . Cesarean section      x 3    Allergies: Review of patient's allergies indicates no known allergies.  Medications: Prior to Admission medications   Medication Sig Start Date End Date Taking? Authorizing Provider  albuterol (PROVENTIL HFA;VENTOLIN HFA) 108 (90 BASE) MCG/ACT inhaler Inhale 1-2 puffs into the lungs every 6 (six) hours as needed for wheezing. 01/22/12  Yes Nelva Nay, MD  ALPRAZolam Prudy Feeler) 0.25 MG tablet Take 0.25 mg by mouth 2 (two) times  daily as needed for anxiety.   Yes Historical Provider, MD  amLODipine (NORVASC) 10 MG tablet Take 10 mg by mouth daily.   Yes Historical Provider, MD  aspirin EC 81 MG EC tablet Take 1 tablet (81 mg total) by mouth daily. 07/10/12  Yes Shanker Levora Dredge, MD  BIOTIN PO Take 500 mcg by mouth daily.   Yes Historical Provider, MD  buPROPion (WELLBUTRIN) 100 MG tablet Take 100 mg by mouth 2 (two) times daily.   Yes Historical Provider, MD  glimepiride (AMARYL) 4 MG tablet Take 4 mg by mouth daily with breakfast.   Yes Historical Provider, MD  hydrochlorothiazide (HYDRODIURIL) 25 MG tablet Take 25 mg by mouth daily.   Yes Historical Provider, MD  lisinopril (PRINIVIL,ZESTRIL) 40 MG tablet Take 40 mg by mouth daily.   Yes Historical Provider, MD  metFORMIN (GLUCOPHAGE) 500 MG tablet Take 1 tablet (500 mg total) by mouth 2 (two) times daily with a meal. 07/11/12  Yes Clydia Llano, MD  mometasone-formoterol (DULERA) 100-5 MCG/ACT AERO Inhale 2 puffs into the lungs 2 (two) times daily.   Yes Historical Provider, MD  Multiple Vitamins-Minerals (MULTIVITAMIN WITH MINERALS) tablet Take 1 tablet by mouth daily.   Yes Historical Provider, MD  pantoprazole (PROTONIX) 40 MG tablet Take 40 mg by mouth daily as needed.    Yes Historical Provider, MD  amLODipine-olmesartan (AZOR) 5-20 MG per tablet Take 1 tablet by mouth daily.  Patient not taking: Reported on 05/27/2014 05/13/14   Nyoka Cowden, MD     Family History  Problem Relation Age of Onset  . Hypertension Mother   . Allergies Daughter   . Allergies Sister   . Asthma Daughter     History   Social History  . Marital Status: Legally Separated    Spouse Name: N/A  . Number of Children: N/A  . Years of Education: N/A   Occupational History  . Retired    Social History Main Topics  . Smoking status: Never Smoker   . Smokeless tobacco: Never Used  . Alcohol Use: No  . Drug Use: No  . Sexual Activity: No   Other Topics Concern  . None   Social  History Narrative   Moved here from [place].  Disabled. Formerly worked at [employer] center.      Review of Systems: A 12 point ROS discussed and pertinent positives are indicated in the HPI above.  All other systems are negative.  Review of Systems  Vital Signs: BP 169/102 mmHg  Pulse 119  Temp(Src) 97.6 F (36.4 C) (Oral)  Resp 22  Ht  (1.702 m)  Wt 309 lb (140.161 kg)  BMI 48.38 kg/m2  SpO2 98%  Physical Exam  Constitutional: She is oriented to person, place, and time. She appears well-developed. She appears distressed.  HENT:  Head: Normocephalic and atraumatic.  Eyes: No scleral icterus.  Cardiovascular: Tachycardia present.   Pulmonary/Chest: Tachypnea noted.  Abdominal: Soft.  Neurological: She is alert and oriented to person, place, and time.  Skin: Skin is warm. She is diaphoretic.  Vitals reviewed.   Mallampati Score:  3  Imaging: Ct Angio Chest Pe W/cm &/or Wo Cm  05/27/2014   CLINICAL DATA:  Shortness of breath for several days  EXAM: CT ANGIOGRAPHY CHEST WITH CONTRAST  TECHNIQUE: Multidetector CT imaging of the chest was performed using the standard protocol during bolus administration of intravenous contrast. Multiplanar CT image reconstructions and MIPs were obtained to evaluate the vascular anatomy.  CONTRAST:  OMNIPAQUE IOHEXOL 350 MG/ML SOLN  COMPARISON:  None.  FINDINGS: The lungs are well aerated bilaterally. No focal infiltrate or sizable effusion is seen.  The thoracic inlet is within normal limits. Mild calcifications of the thoracic aorta are noted without aneurysmal dilatation. Pulmonary artery demonstrates significant pulmonary emboli as well as a focal saddle embolus traversing the bifurcation of the main pulmonary artery. Evidence of right heart strain is noted with dilatation of right ventricle.  No hilar or mediastinal adenopathy is noted. The visualized upper abdomen is within normal limits. No acute bony abnormality is noted.  Degenerative changes of thoracic spine are seen.  Review of the MIP images confirms the above findings.  IMPRESSION: Positive for acute PE with CT evidence of right heart strain (RV/LV Ratio = 1.12) consistent with at least submassive (intermediate risk) PE. The presence of right heart strain has been associated with an increased risk of morbidity and mortality. Please activate Code PE by paging [phone].  Critical Value/emergent results were called by telephone at the time of interpretation on 05/27/2014 at 3:16 pm to Dr. Purvis Sheffield , who verbally acknowledged these results.   Electronically Signed   By: Alcide Clever M.D.   On: 05/27/2014 15:19   Dg Chest Port 1 View  05/27/2014   CLINICAL DATA:  Four day history of shortness of breath  EXAM: PORTABLE CHEST - 1 VIEW  COMPARISON:  May 20, 2013  FINDINGS: There is left lower lobe airspace consolidation. Lungs are otherwise clear. Heart is mildly enlarged with pulmonary vascularity within normal limits. No adenopathy. No bone lesions. Left  IMPRESSION: Left lower lobe consolidation.  Mild cardiac prominence, stable.   Electronically Signed   By: Bretta BangWilliam  Woodruff III M.D.   On: 05/27/2014 11:59    Labs:  CBC:  Recent Labs  05/27/14 1139  WBC 14.7*  HGB 11.2*  HCT 34.9*  PLT 190    COAGS:  Recent Labs  05/27/14 1744  INR 1.52*    BMP:  Recent Labs  05/27/14 1139  NA 135  K 4.4  CL 102  CO2 19*  GLUCOSE 268*  BUN 26*  CALCIUM 9.2  CREATININE 1.28*  GFRNONAA 43*  GFRAA 50*    LIVER FUNCTION TESTS: No results for input(s): BILITOT, AST, ALT, ALKPHOS, PROT, ALBUMIN in the last 8760 hours.  TUMOR MARKERS: No results for input(s): AFPTM, CEA, CA199, CHROMGRNA in the last 8760 hours.  Assessment and Plan:  Submassive bordering on massive PE with significant tachypnea. Risks, benefits and alternatives to PE lysis explained in detail.  Patient understands and wishes to proceed.   - Pre-lysis labs ordered - Proceed  with USAT of bilateral pulmonary arteries   Thank you for this interesting consult.  I greatly enjoyed meeting [name] and look forward to participating in their care.  SignedMalachy Moan: Mackynzie Woolford 05/27/2014, 7:19 PM

## 2014-05-27 NOTE — Sedation Documentation (Signed)
Patient is resting

## 2014-05-27 NOTE — Patient Instructions (Signed)
Transport to ER via EMS 

## 2014-05-27 NOTE — Progress Notes (Signed)
Pt given prn Xanax about 2250 after pt stated she "felt like she was breathing kind of fast."   Back in to check on pt around 2315 and pt again stated she "felt like I am breathing kind of fast and it hurts when I take a breath in." Pt sounds clear but diminished in the bases. RR rate 40, with shallow breaths. RT called and PRN breathing treatment given.  [facility] and made aware of the above. New orders received. Will continue to monitor closely.

## 2014-05-27 NOTE — ED Provider Notes (Signed)
CSN: [id number]     Arrival date & time 05/27/14  1121 History   First MD Initiated Contact with Patient 05/27/14 1154     Chief Complaint  Patient presents with  . Shortness of Breath     (Consider location/radiation/quality/duration/timing/severity/associated sxs/prior Treatment) Patient is a 64 y.o. female presenting with shortness of breath. The history is provided by the patient.  Shortness of Breath Severity:  Moderate Onset quality:  Gradual Duration:  4 days Timing:  Constant Progression:  Worsening Chronicity:  New Context: activity   Relieved by:  Nothing Worsened by:  Activity Ineffective treatments:  Inhaler and rest Associated symptoms: chest pain   Associated symptoms: no abdominal pain, no cough, no fever, no headaches, no neck pain and no vomiting   Chest pain:    Quality:  Sharp   Severity:  Mild   Onset quality:  Sudden   Duration: 1 min.   Timing: once.   Progression:  Resolved   Chronicity:  New   Past Medical History  Diagnosis Date  . Osteoarthritis   . Hypertension   . Morbid obesity   . Diabetes mellitus without complication    Past Surgical History  Procedure Laterality Date  . Cesarean section      x 3   Family History  Problem Relation Age of Onset  . Hypertension Mother   . Allergies Daughter   . Allergies Sister   . Asthma Daughter    History  Substance Use Topics  . Smoking status: Never Smoker   . Smokeless tobacco: Never Used  . Alcohol Use: No   OB History    No data available     Review of Systems  Constitutional: Negative for fever and fatigue.  HENT: Negative for congestion and drooling.   Eyes: Negative for pain.  Respiratory: Positive for shortness of breath. Negative for cough.   Cardiovascular: Positive for chest pain.  Gastrointestinal: Negative for nausea, vomiting, abdominal pain and diarrhea.  Genitourinary: Negative for dysuria and hematuria.  Musculoskeletal: Negative for back pain, gait problem and  neck pain.  Skin: Negative for color change.  Neurological: Positive for weakness. Negative for dizziness and headaches.  Hematological: Negative for adenopathy.  Psychiatric/Behavioral: Negative for behavioral problems.  All other systems reviewed and are negative.     Allergies  Review of patient's allergies indicates no known allergies.  Home Medications   Prior to Admission medications   Medication Sig Start Date End Date Taking? Authorizing Provider  albuterol (PROVENTIL HFA;VENTOLIN HFA) 108 (90 BASE) MCG/ACT inhaler Inhale 1-2 puffs into the lungs every 6 (six) hours as needed for wheezing. 01/22/12   Nelva Nayobert Beaton, MD  amLODipine (NORVASC) 5 MG tablet Take 5 mg by mouth daily.    Historical Provider, MD  amLODipine-olmesartan (AZOR) 5-20 MG per tablet Take 1 tablet by mouth daily. 05/13/14   Nyoka CowdenMichael B Wert, MD  aspirin EC 81 MG EC tablet Take 1 tablet (81 mg total) by mouth daily. 07/10/12   Shanker Levora DredgeM Ghimire, MD  BIOTIN PO Take 500 mcg by mouth daily.    Historical Provider, MD  glimepiride (AMARYL) 4 MG tablet Take 4 mg by mouth daily with breakfast.    Historical Provider, MD  hydrochlorothiazide (MICROZIDE) 12.5 MG capsule Take 12.5 mg by mouth daily.    Historical Provider, MD  metFORMIN (GLUCOPHAGE) 500 MG tablet Take 1 tablet (500 mg total) by mouth 2 (two) times daily with a meal. 07/11/12   Clydia LlanoMutaz Elmahi, MD  mometasone-formoterol (DULERA) 100-5  MCG/ACT AERO Inhale 2 puffs into the lungs 2 (two) times daily.    Historical Provider, MD  pantoprazole (PROTONIX) 40 MG tablet Take 40 mg by mouth daily as needed.     Historical Provider, MD  UNABLE TO FIND Med Name: PERNA otc vitamin  1 with each meal    Historical Provider, MD   BP 124/70 mmHg  Temp(Src) 97.6 F (36.4 C) (Oral)  Resp 32  Ht  (1.702 m)  Wt 309 lb (140.161 kg)  BMI 48.38 kg/m2  SpO2 97% Physical Exam  Constitutional: She is oriented to person, place, and time. She appears well-developed and  well-nourished.  HENT:  Head: Normocephalic.  Mouth/Throat: Oropharynx is clear and moist. No oropharyngeal exudate.  Eyes: Conjunctivae and EOM are normal. Pupils are equal, round, and reactive to light.  Neck: Normal range of motion. Neck supple.  Cardiovascular: Normal rate, regular rhythm, normal heart sounds and intact distal pulses.  Exam reveals no gallop and no friction rub.   No murmur heard. Pulmonary/Chest: Breath sounds normal. She is in respiratory distress. She has no wheezes.  Abdominal: Soft. Bowel sounds are normal. There is no tenderness. There is no rebound and no guarding.  Musculoskeletal: Normal range of motion. She exhibits no edema or tenderness.  Symmetric lower extremities without focal tenderness.  Neurological: She is alert and oriented to person, place, and time.  Skin: Skin is warm and dry.  Psychiatric: She has a normal mood and affect. Her behavior is normal.  Nursing note and vitals reviewed.   ED Course  Procedures (including critical care time) Labs Review Labs Reviewed  CBC - Abnormal; Notable for the following:    WBC 14.7 (*)    Hemoglobin 11.2 (*)    HCT 34.9 (*)    MCV 73.2 (*)    MCH 23.5 (*)    RDW 19.8 (*)    All other components within normal limits  CBG MONITORING, ED - Abnormal; Notable for the following:    Glucose-Capillary 249 (*)    All other components within normal limits  BRAIN NATRIURETIC PEPTIDE  BASIC METABOLIC PANEL  Rosezena Sensor, ED    Imaging Review Dg Chest Port 1 View  05/27/2014   CLINICAL DATA:  Four day history of shortness of breath  EXAM: PORTABLE CHEST - 1 VIEW  COMPARISON:  May 20, 2013  FINDINGS: There is left lower lobe airspace consolidation. Lungs are otherwise clear. Heart is mildly enlarged with pulmonary vascularity within normal limits. No adenopathy. No bone lesions. Left  IMPRESSION: Left lower lobe consolidation.  Mild cardiac prominence, stable.   Electronically Signed   By: Bretta Bang  III M.D.   On: 05/27/2014 11:59     EKG Interpretation   Date/Time:  Tuesday May 27 2014 11:29:28 EDT Ventricular Rate:  122 PR Interval:  138 QRS Duration: 101 QT Interval:  361 QTC Calculation: 514 R Axis:   -84 Text Interpretation:  Sinus tachycardia Ventricular bigeminy Probable left  atrial enlargement Left anterior fascicular block Anterior infarct, old  Nonspecific T abnormalities, lateral leads Prolonged QT interval Confirmed  by Alizandra Loh  MD, Maili Shutters ([id number]) on 05/27/2014 12:10:31 PM     CRITICAL CARE Performed by: Purvis Sheffield, S Total critical care time: 30 min Critical care time was exclusive of separately billable procedures and treating other patients. Critical care was necessary to treat or prevent imminent or life-threatening deterioration. Critical care was time spent personally by me on the following activities: development of treatment plan  with patient and/or surrogate as well as nursing, discussions with consultants, evaluation of patient's response to treatment, examination of patient, obtaining history from patient or surrogate, ordering and performing treatments and interventions, ordering and review of laboratory studies, ordering and review of radiographic studies, pulse oximetry and re-evaluation of patient's condition.  MDM   Final diagnoses:  SOB (shortness of breath)  Pulmonary embolism    12:21 PM 64 y.o. female w hx of HTN, obesity, DM who presents with worsening shortness of breath over the last 4 days. She also notes worsening generalized weakness. She states that she had had a cough for the last 1-2 months but this seemed to have resolved. She denies any fevers. She states that she had some fleeting left-sided sharp chest pain yesterday which lasted about a minute. She denies any chest pain today. She is tachypneic on exam vital signs are otherwise unremarkable. We'll get screening labs and imaging.  4:44 PM Pt started becoming hypotensive.  Critical care consulted and plan for patient to go to IR. Critical care to admit.   Purvis Sheffield, MD 05/27/14 [phone]

## 2014-05-27 NOTE — Progress Notes (Signed)
Chart and office notes/ xray and ekg reviewed > she was better p last ov but much worse x 4 days with non-specific cp and ventricular bigeminy now so needs ER eval and consider admit > to [facility] by ems planned

## 2014-05-27 NOTE — Sedation Documentation (Signed)
Obtained report from Lavone NianPeggy T, RN

## 2014-05-27 NOTE — Sedation Documentation (Signed)
Main PAP 74/39 (50)

## 2014-05-27 NOTE — ED Notes (Signed)
Pt nauseous, complaining of chest pain, very anxious. Notified MD.

## 2014-05-27 NOTE — ED Notes (Signed)
Pt transporting to IR.  

## 2014-05-27 NOTE — ED Notes (Signed)
X-ray at bedside

## 2014-05-27 NOTE — ED Notes (Signed)
Vascular tech at bedside. °

## 2014-05-27 NOTE — Assessment & Plan Note (Signed)
Severe SOB ? Etiology with abnormal EKG  Pt very weak in office and dyspneic  ? Component of CHF with orthopnea and edema.  Case reviewed in detail with Dr. Sherene SiresWert   Pt to be transported to ER via EMS for further evaluation

## 2014-05-27 NOTE — Progress Notes (Signed)
RT attempted to insert aline two times will no success. Patient refuses to continue to try. MD aware.

## 2014-05-27 NOTE — Sedation Documentation (Signed)
Patient is resting, SOB, anxious. Meds given per MD

## 2014-05-27 NOTE — Progress Notes (Signed)
Subjective:    Patient ID: [name], female    DOB: [birth year]   MRN: [id number]     Brief patient profile:  42 yobf never smoker with breathing problems around 2008  eval by Dr Petra Kuba never told she had asthma but better p inhaler but stopped it then again in same problem 2014 eval at  [facility] maint on advair 250 and alb since then using it daily but ? 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    History of Present Illness  05/20/2013 1st [facility] office visit/ Wert Chief Complaint  Patient presents with  . Pulmonary Consult    Referred per Dr. [name]. Pt c/o SOB for the past 2 years- esp when she first gets up in the am. She states also has non prod cough at night- cough drops help.     prev using saba up 2-3 daily but no need since stopped advair, still on ACEi and already on PPI daily  rec Leave off advair completely and the lisinopril and amlodipine and and hydroduril start tribenzor 40/10/25 one daily  Only use your albuterol as a rescue medication   Please schedule a follow up office visit in 6 weeks> did not return    05/13/2014 f/u ov/Wert re:  Chief Complaint  Patient presents with  . Follow-up    Pt c/o increased SOB for the past 2-3 months. Starting to improve after started taking Dulera inhaler.   back on acei and indolent onset progressive sob at rest and with slow adls since restarted though denies assoc cough and better on dulera   >>change ACE /Norvasc to Azor     05/27/2014 Acute OV   Pt presents to office with complaints of  SOB (extreme) x 4 days; can't hardly take a few steps with issues Has had chest pains on/off for few days.  Pt required assistance in office and needed wheelchair .  Cough is improved. Seen 2 weeks ago for dyspnea and cough .  Changed off ACE and norvasc to AZOR  . Cough is better .  Dyspnea got  worse 4 days ago.  Chart review showed echo 2014 with decreased EF at 40-45%.  ? Old MI .  She is a DM .  EKG in office is irregular with worsening R wave progression compared to 2014 with bigemeny (new ) .  We placed her on O2 at 2l/m . EMS was called for transport to ER for further evaluation for possible cardiac issues .  O2 sats on arrival were normal on RA in wheelchair. B/p normal.  +orthopnea and edema.   Current Medications, Allergies, Complete Past Medical History, Past Surgical History, Family History, and Social History were reviewed in [employer] record.  ROS    Constitutional:   No  weight loss, night sweats,  Fevers, chills,  +fatigue, or  lassitude.  HEENT:   No headaches,  Difficulty swallowing,  Tooth/dental problems, or  Sore throat,                No sneezing, itching, ear ache, nasal congestion, post nasal drip,   CV:  + chest pain,  Orthopnea, PND, swelling in lower extremities, No  anasarca, dizziness, palpitations, syncope.   GI  No heartburn, indigestion, abdominal pain, nausea, vomiting, diarrhea, change in bowel habits, loss of appetite, bloody stools.  Resp: ++ shortness of breath with exertion or at rest.  No excess mucus, no productive cough,  No non-productive cough,  No coughing up of blood.  No change in color of mucus.  No wheezing.  No chest wall deformity  Skin: no rash or lesions.  GU: no dysuria, change in color of urine, no urgency or frequency.  No flank pain, no hematuria   MS:  No joint pain or swelling.  No decreased range of motion.  No back pain.  Psych:  No change in mood or affect. No depression or anxiety.  No memory loss.                           Objective:   Physical Exam    amb obese bf in wc   HEENT: nl dentition, turbinates, and orophanx. Nl external ear canals without cough reflex   NECK :  without JVD/Nodes/TM/ nl carotid upstrokes bilaterally   LUNGS: no acc muscle use, clear  to A and P bilaterally without cough on insp or exp maneuvers   CV:  Irregular , tachy , tr edema   ABD:  soft and nontender with nl excursion in the supine position. No bruits or organomegaly, bowel sounds nl  MS:  warm without deformities, calf tenderness, cyanosis or clubbing  SKIN: warm and dry without lesions    NEURO:  alert, approp, no deficits   CXR  05/20/2013 :  images reviewed:   The heart size and mediastinal contours are within normal limits.  Both lungs are clear. The visualized skeletal structures are  unremarkable.    EKG 05/27/2014  Tachycardia with bigemny        Assessment & Plan:

## 2014-05-27 NOTE — ED Notes (Signed)
Pt has been SOB for several days, went to MD office. Pt was so SOB had to be wheeled in wheelchair at MD office. EKG at office appears different than previous EKG. EMS reports pt having PVC's. O2 sats 94-97% on 2L [place]. PT does not wear O2 at home. Pt HR 116, BP 198/102. Pt visibly SOB.

## 2014-05-27 NOTE — Progress Notes (Signed)
ANTICOAGULATION CONSULT NOTE - Initial Consult  Pharmacy Consult for heparin Indication: pulmonary embolus  No Known Allergies  Patient Measurements: Height: 5\' 7"  (170.2 cm) Weight: (!) 309 lb (140.161 kg) IBW/kg (Calculated) : 61.6 Heparin Dosing Weight: 95.9kg  Vital Signs: Temp: 97.6 F (36.4 C) (05/24 1136) Temp Source: Oral (05/24 1136) BP: 103/72 mmHg (05/24 2300) Pulse Rate: 116 (05/24 2245)  Labs:  Recent Labs  05/27/14 1139 05/27/14 1744 05/27/14 1745 05/27/14 2230  HGB 11.2*  --   --  11.5*  HCT 34.9*  --   --  36.7  PLT 190  --   --  PENDING  LABPROT  --  18.3*  --   --   INR  --  1.52*  --   --   HEPARINUNFRC  --   --   --  0.65  CREATININE 1.28*  --   --   --   TROPONINI  --   --  0.06*  --     Estimated Creatinine Clearance: 65.2 mL/min (by C-G formula based on Cr of 1.28).   Medical History: Past Medical History  Diagnosis Date  . Osteoarthritis   . Hypertension   . Morbid obesity   . Diabetes mellitus without complication     Medications:  Infusions:  . sodium chloride 125 mL/hr at 05/27/14 2048  . sodium chloride 35 mL/hr at 05/27/14 2030  . sodium chloride 35 mL/hr at 05/27/14 2030  . [START ON 05/28/2014] sodium chloride    . [START ON 05/28/2014] sodium chloride    . heparin 1,500 Units/hr (05/27/14 1546)    Assessment: 64 yof presented to the ED with SOB. CT reported with submassive acute PE with right heart strain. Baseline H/H is slightly low but platelets are WNL. She is not on any anticoagulation PTA.   Initial HL is therapeutic at 0.65 on heparin 1500 units/hr. Nurse reports no issues with infusion or bleeding.  Goal of Therapy:  Heparin level 0.3-0.7 units/ml Monitor platelets by anticoagulation protocol: Yes   Plan:  - Heparin gtt 1500 units/hr - Daily heparin level and CBC - F/u plans for oral anticoagulation  Arlean Hoppingorey M. Newman PiesBall, PharmD Clinical Pharmacist Pager [phone] 05/27/2014,11:13 PM

## 2014-05-27 NOTE — ED Notes (Signed)
Critical care at bedside  

## 2014-05-27 NOTE — H&P (Signed)
PULMONARY / CRITICAL CARE MEDICINE   Name: [name] MRN: [id number] DOB: April 09, 1950    ADMISSION DATE:  05/27/2014 CONSULTATION DATE:  05/27/2014  REFERRING MD :  EDP  CHIEF COMPLAINT:  SOB  INITIAL PRESENTATION:  64 y.o. F [facility] with severe SOB.  Found to have PE with CT evidence of RHS.  PCCM called for admission.     STUDIES:  CTA chest 5/24 >>> acute PE with focal saddle embolus and extending bilaterally, RV/LV ratio of 1.12 Echo 5/24 >>> LE duplex 5/24 >>>  SIGNIFICANT EVENTS: 5/24 - admitted with extensive PE. IR consulted for EKOS   HISTORY OF PRESENT ILLNESS:   [name] is a 64 y.o. F with PMh of OA, HTN, DM, morbid obesity who presented to [facility] ED 5/24 with severe SOB.  She was evaluated in our pulmonary office and due to her high O2 demands, generalized weakness, EKG with bigeminy, was sent to ED for further evaluation.  In ED, sats were less than 90% and she was placed on 2L O2 with improvement to 94%.  O2 had to be increased to 4L after a few hours to maintain SpO2 > 92%. CTA of the chest revealed an acute PE with focal saddle embolus and extending bilaterally.  There was CT evidence of right heart strain with RV/LV ratio of 1.12.  Code PE was activated and PCCM was called for admission.  She denies any hx of VTE, LE swelling, recent periods of prolonged immobilization, recent long travel, hx of malignancies, hx of clotting disorders, hemoptysis, hormonal use.  She is a former smoker, only while she was in college.  She does report that her maternal side had multiple members with "clots that went to their head and killed them".  Unable to recall if these were in fact CVA's. She is unsure of when her last malignancy screenings were.  She does confirm that she has had had a colonoscopy, PAP smear, mammogram in the past but just unsure of last time.   PAST MEDICAL HISTORY :   has a past medical history of Osteoarthritis; Hypertension; Morbid  obesity; and Diabetes mellitus without complication.  has past surgical history that includes Cesarean section. Prior to Admission medications   Medication Sig Start Date End Date Taking? Authorizing Provider  albuterol (PROVENTIL HFA;VENTOLIN HFA) 108 (90 BASE) MCG/ACT inhaler Inhale 1-2 puffs into the lungs every 6 (six) hours as needed for wheezing. 01/22/12  Yes Nelva Nayobert Beaton, MD  ALPRAZolam Prudy Feeler(XANAX) 0.25 MG tablet Take 0.25 mg by mouth 2 (two) times daily as needed for anxiety.   Yes Historical Provider, MD  amLODipine (NORVASC) 10 MG tablet Take 10 mg by mouth daily.   Yes Historical Provider, MD  aspirin EC 81 MG EC tablet Take 1 tablet (81 mg total) by mouth daily. 07/10/12  Yes Shanker Levora DredgeM Ghimire, MD  BIOTIN PO Take 500 mcg by mouth daily.   Yes Historical Provider, MD  buPROPion (WELLBUTRIN) 100 MG tablet Take 100 mg by mouth 2 (two) times daily.   Yes Historical Provider, MD  glimepiride (AMARYL) 4 MG tablet Take 4 mg by mouth daily with breakfast.   Yes Historical Provider, MD  hydrochlorothiazide (HYDRODIURIL) 25 MG tablet Take 25 mg by mouth daily.   Yes Historical Provider, MD  lisinopril (PRINIVIL,ZESTRIL) 40 MG tablet Take 40 mg by mouth daily.   Yes Historical Provider, MD  metFORMIN (GLUCOPHAGE) 500 MG tablet Take 1 tablet (500 mg total) by mouth 2 (  two) times daily with a meal. 07/11/12  Yes Clydia Llano, MD  mometasone-formoterol (DULERA) 100-5 MCG/ACT AERO Inhale 2 puffs into the lungs 2 (two) times daily.   Yes Historical Provider, MD  Multiple Vitamins-Minerals (MULTIVITAMIN WITH MINERALS) tablet Take 1 tablet by mouth daily.   Yes Historical Provider, MD  pantoprazole (PROTONIX) 40 MG tablet Take 40 mg by mouth daily as needed.    Yes Historical Provider, MD  amLODipine-olmesartan (AZOR) 5-20 MG per tablet Take 1 tablet by mouth daily. Patient not taking: Reported on 05/27/2014 05/13/14   Nyoka Cowden, MD   No Known Allergies  FAMILY HISTORY:  Family History  Problem  Relation Age of Onset  . Hypertension Mother   . Allergies Daughter   . Allergies Sister   . Asthma Daughter     SOCIAL HISTORY:  reports that she has never smoked. She has never used smokeless tobacco. She reports that she does not drink alcohol or use illicit drugs.  REVIEW OF SYSTEMS:   All negative; except for those that are bolded, which indicate positives.  Constitutional: weight loss, weight gain, night sweats, fevers, chills, fatigue, weakness.  HEENT: headaches, sore throat, sneezing, nasal congestion, post nasal drip, difficulty swallowing, tooth/dental problems, visual complaints, visual changes, ear aches. Neuro: difficulty with speech, weakness, numbness, ataxia. CV:  chest pain, orthopnea, PND, swelling in lower extremities, dizziness, palpitations, syncope.  Resp: cough, hemoptysis, dyspnea, wheezing. GI  heartburn, indigestion, abdominal pain, nausea, vomiting, diarrhea, constipation, change in bowel habits, loss of appetite, hematemesis, melena, hematochezia.  GU: dysuria, change in color of urine, urgency or frequency, flank pain, hematuria. MSK: joint pain or swelling, decreased range of motion. Psych: change in mood or affect, depression, anxiety, suicidal ideations, homicidal ideations. Skin: rash, itching, bruising.   SUBJECTIVE: SOB persists and she has mild chest discomfort.    VITAL SIGNS: Temp:  [97.6 F (36.4 C)] 97.6 F (36.4 C) (05/24 1136) Pulse Rate:  [112-121] 114 (05/24 1620) Resp:  [22-37] 27 (05/24 1620) BP: (80-129)/(59-95) 91/69 mmHg (05/24 1620) SpO2:  [94 %-100 %] 99 % (05/24 1620) Weight:  [140.161 kg (309 lb)] 140.161 kg (309 lb) (05/24 1127) HEMODYNAMICS:   VENTILATOR SETTINGS:   INTAKE / OUTPUT: Intake/Output      05/23 0701 - 05/24 0700 05/24 0701 - 05/25 0700   I.V. (mL/kg)  800 (5.7)   Total Intake(mL/kg)  800 (5.7)   Net   +800          PHYSICAL EXAMINATION: General: Morbidly obese female, in mild respiratory  distress. Neuro: A&O x 3, non-focal.  HEENT: [place]/AT. PERRL, sclerae anicteric. Cardiovascular: Tachy, regular, no M/R/G.  Lungs: Respirations shallow and rapid, CTA. Abdomen: Obese, BS active, soft, NT/ND.  Musculoskeletal: No gross deformities, no edema.  Skin: Intact, warm, no rashes.  LABS:  CBC  Recent Labs Lab 05/27/14 1139  WBC 14.7*  HGB 11.2*  HCT 34.9*  PLT 190   Coag's No results for input(s): APTT, INR in the last 168 hours. BMET  Recent Labs Lab 05/27/14 1139  NA 135  K 4.4  CL 102  CO2 19*  BUN 26*  CREATININE 1.28*  GLUCOSE 268*   Electrolytes  Recent Labs Lab 05/27/14 1139  CALCIUM 9.2   Sepsis Markers No results for input(s): LATICACIDVEN, PROCALCITON, O2SATVEN in the last 168 hours. ABG No results for input(s): PHART, PCO2ART, PO2ART in the last 168 hours. Liver Enzymes No results for input(s): AST, ALT, ALKPHOS, BILITOT, ALBUMIN in the last 168 hours.  Cardiac Enzymes No results for input(s): TROPONINI, PROBNP in the last 168 hours. Glucose  Recent Labs Lab 05/27/14 1210  GLUCAP 249*    Imaging Ct Angio Chest Pe W/cm &/or Wo Cm  05/27/2014   CLINICAL DATA:  Shortness of breath for several days  EXAM: CT ANGIOGRAPHY CHEST WITH CONTRAST  TECHNIQUE: Multidetector CT imaging of the chest was performed using the standard protocol during bolus administration of intravenous contrast. Multiplanar CT image reconstructions and MIPs were obtained to evaluate the vascular anatomy.  CONTRAST:  OMNIPAQUE IOHEXOL 350 MG/ML SOLN  COMPARISON:  None.  FINDINGS: The lungs are well aerated bilaterally. No focal infiltrate or sizable effusion is seen.  The thoracic inlet is within normal limits. Mild calcifications of the thoracic aorta are noted without aneurysmal dilatation. Pulmonary artery demonstrates significant pulmonary emboli as well as a focal saddle embolus traversing the bifurcation of the main pulmonary artery. Evidence of right heart strain  is noted with dilatation of right ventricle.  No hilar or mediastinal adenopathy is noted. The visualized upper abdomen is within normal limits. No acute bony abnormality is noted. Degenerative changes of thoracic spine are seen.  Review of the MIP images confirms the above findings.  IMPRESSION: Positive for acute PE with CT evidence of right heart strain (RV/LV Ratio = 1.12) consistent with at least submassive (intermediate risk) PE. The presence of right heart strain has been associated with an increased risk of morbidity and mortality. Please activate Code PE by paging [phone].  Critical Value/emergent results were called by telephone at the time of interpretation on 05/27/2014 at 3:16 pm to Dr. Purvis Sheffield , who verbally acknowledged these results.   Electronically Signed   By: Alcide Clever M.D.   On: 05/27/2014 15:19   Dg Chest Port 1 View  05/27/2014   CLINICAL DATA:  Four day history of shortness of breath  EXAM: PORTABLE CHEST - 1 VIEW  COMPARISON:  May 20, 2013  FINDINGS: There is left lower lobe airspace consolidation. Lungs are otherwise clear. Heart is mildly enlarged with pulmonary vascularity within normal limits. No adenopathy. No bone lesions. Left  IMPRESSION: Left lower lobe consolidation.  Mild cardiac prominence, stable.   Electronically Signed   By: Bretta Bang III M.D.   On: 05/27/2014 11:59     ASSESSMENT / PLAN:  PULMONARY A: Saddle PE with CT evidence of right heart strain - RV/LV ratio of 1.12 Probable OSA / OHS Former smoker P:   IR consulted for EKOS. Continue systemic heparin. Will need minimum 6 months anticoagulation for unprovoked PE. F/u on echo. F/u on LE dopplers, if mobile clot then would consider IVC filter. Albuterol PRN. Budesonide / Arformoterol in lieu of outpatient Dulera.  HEMATOLOGIC A:   Saddle PE with CT evidence of right heart strain - RV/LV ratio of 1.12 P:  IR consulted for EKOS. Heparin per pharmacy. Will need minimum 6  months anticoagulation for unprovoked PE. Hypercoagulable panel sent. CBC in AM.  CARDIOVASCULAR A:  Obstructive shock Hx HTN P:  Goal MAP > 65. Trend troponins. TTE. Hold outpatient amlodipine, HCTZ, lisinopril, amlodipine-olmesartan.  RENAL A:   CKD III P:   NS @ 125. BMP in AM.  GASTROINTESTINAL A:   Morbid obesity Nutrition P:   NPO.  INFECTIOUS A:   Leukocytosis - no indication for infection, likely represents an acute phase reactant P:   Monitor clinically.  ENDOCRINE A:   DM P:   CBG's q4hr. SSI.  NEUROLOGIC A:  Anxiety P:   Continue outpatient bupropion. Low dose xanax PRN.   Family updated: None.  Interdisciplinary Family Meeting v Palliative Care Meeting:  Due by: 5/30.   Rutherford Guys, [place] [facility] Pulmonary [facility] Critical Care Medicine Pager: [phone]  or [phone] 05/27/2014, 75:78 PM  64 year old female presenting with a PE.  She has gotten progressively worse since she has been here.  Now she is breathing 40 times a minutes, sats holding but HR is rising and BP are also dropping.  I am concerned that this will compromise her life.  I reviewed CT myself, very large clot burden without pulmonary infarct at this point.  I discussed case with IR MD, will proceed with EKOS.  Will admit to the ICU and proceed with heparin and ekos, will order lower ext dopplers.  The patient is critically ill with multiple organ systems failure and requires high complexity decision making for assessment and support, frequent evaluation and titration of therapies, application of advanced monitoring technologies and extensive interpretation of multiple databases.   Critical Care Time devoted to patient care services described in this note is  35  Minutes. This time reflects time of care of this signee Dr Koren Bound. This critical care time does not reflect procedure time, or teaching time or supervisory time of PA/NP/Med student/Med Resident etc  but could involve care discussion time.  Alyson Reedy, M.D. [facility] Pulmonary/Critical Care Medicine. Pager: [phone]. After hours pager: [phone].

## 2014-05-27 NOTE — Sedation Documentation (Signed)
Patient is resting, severe SOB

## 2014-05-27 NOTE — ED Notes (Signed)
Notified CT, pt ready for transport

## 2014-05-27 NOTE — ED Notes (Signed)
IR to pick up pt before going to floor.

## 2014-05-27 NOTE — Progress Notes (Signed)
eLink Physician-Brief Progress Note Patient Name: Julia PerchesGeraldine E White DOB: November 25, 1950 MRN: [id number]   Date of Service  05/27/2014  HPI/Events of Note  Patient c/o SOB. Hx of submassive PE s/p EKOS. Now on 100% NRBM with sat = 99% and RR = 35-40. Patient weighs 140 kg. May have some element of OSA and obesity hypoventilation. May benefit from BiPAP.   eICU Interventions  Trial of BiPAP IPAP 15 and EPAP 8. Titrate IPAP to keep TV about 500 mL.      Intervention Category Major Interventions: Respiratory failure - evaluation and management  Jacoby Zanni Eugene 05/27/2014, 11:29 PM

## 2014-05-27 NOTE — Sedation Documentation (Signed)
Patient is resting, SOB

## 2014-05-28 ENCOUNTER — Inpatient Hospital Stay (HOSPITAL_COMMUNITY): Payer: Medicare Other

## 2014-05-28 DIAGNOSIS — R0602 Shortness of breath: Secondary | ICD-10-CM

## 2014-05-28 LAB — FIBRINOGEN
FIBRINOGEN: 458 mg/dL (ref 204–475)
Fibrinogen: 432 mg/dL (ref 204–475)
Fibrinogen: 378 mg/dL (ref 204–475)

## 2014-05-28 LAB — CBC
HCT: 34.6 % — ABNORMAL LOW (ref 36.0–46.0)
HCT: 34.7 % — ABNORMAL LOW (ref 36.0–46.0)
HCT: 26.5 % — ABNORMAL LOW (ref 36.0–46.0)
HCT: 32 % — ABNORMAL LOW (ref 36.0–46.0)
HEMOGLOBIN: 11.2 g/dL — AB (ref 12.0–15.0)
HEMOGLOBIN: 8.4 g/dL — AB (ref 12.0–15.0)
Hemoglobin: 11.3 g/dL — ABNORMAL LOW (ref 12.0–15.0)
Hemoglobin: 10.2 g/dL — ABNORMAL LOW (ref 12.0–15.0)
MCH: 23.9 pg — AB (ref 26.0–34.0)
MCH: 24 pg — ABNORMAL LOW (ref 26.0–34.0)
MCH: 23.3 pg — AB (ref 26.0–34.0)
MCH: 23.7 pg — AB (ref 26.0–34.0)
MCHC: 32.4 g/dL (ref 30.0–36.0)
MCHC: 32.6 g/dL (ref 30.0–36.0)
MCHC: 31.7 g/dL (ref 30.0–36.0)
MCHC: 31.9 g/dL (ref 30.0–36.0)
MCV: 73.8 fL — AB (ref 78.0–100.0)
MCV: 73.9 fL — ABNORMAL LOW (ref 78.0–100.0)
MCV: 73.6 fL — AB (ref 78.0–100.0)
MCV: 74.2 fL — AB (ref 78.0–100.0)
PLATELETS: 123 10*3/uL — AB (ref 150–400)
Platelets: 123 10*3/uL — ABNORMAL LOW (ref 150–400)
Platelets: 85 10*3/uL — ABNORMAL LOW (ref 150–400)
Platelets: 94 10*3/uL — ABNORMAL LOW (ref 150–400)
RBC: 4.68 MIL/uL (ref 3.87–5.11)
RBC: 4.7 MIL/uL (ref 3.87–5.11)
RBC: 3.6 MIL/uL — ABNORMAL LOW (ref 3.87–5.11)
RBC: 4.31 MIL/uL (ref 3.87–5.11)
RDW: 20.2 % — AB (ref 11.5–15.5)
RDW: 20.2 % — ABNORMAL HIGH (ref 11.5–15.5)
RDW: 20.1 % — ABNORMAL HIGH (ref 11.5–15.5)
RDW: 20.2 % — AB (ref 11.5–15.5)
WBC: 16 10*3/uL — ABNORMAL HIGH (ref 4.0–10.5)
WBC: 16.1 10*3/uL — ABNORMAL HIGH (ref 4.0–10.5)
WBC: 11 10*3/uL — AB (ref 4.0–10.5)
WBC: 14 10*3/uL — ABNORMAL HIGH (ref 4.0–10.5)

## 2014-05-28 LAB — POCT I-STAT, CHEM 8
BUN: 36 mg/dL — AB (ref 6–20)
CHLORIDE: 109 mmol/L (ref 101–111)
CREATININE: 2.3 mg/dL — AB (ref 0.44–1.00)
Calcium, Ion: 1.2 mmol/L (ref 1.13–1.30)
Glucose, Bld: 114 mg/dL — ABNORMAL HIGH (ref 65–99)
HEMATOCRIT: 32 % — AB (ref 36.0–46.0)
HEMOGLOBIN: 10.9 g/dL — AB (ref 12.0–15.0)
POTASSIUM: 5 mmol/L (ref 3.5–5.1)
SODIUM: 136 mmol/L (ref 135–145)
TCO2: 15 mmol/L (ref 0–100)

## 2014-05-28 LAB — BASIC METABOLIC PANEL
Anion gap: 11 (ref 5–15)
BUN: 33 mg/dL — ABNORMAL HIGH (ref 6–20)
CALCIUM: 8.3 mg/dL — AB (ref 8.9–10.3)
CO2: 18 mmol/L — ABNORMAL LOW (ref 22–32)
Chloride: 107 mmol/L (ref 101–111)
Creatinine, Ser: 2.07 mg/dL — ABNORMAL HIGH (ref 0.44–1.00)
GFR calc Af Amer: 28 mL/min — ABNORMAL LOW (ref >60)
GFR, EST NON AFRICAN AMERICAN: 24 mL/min — AB (ref >60)
GLUCOSE: 160 mg/dL — AB (ref 65–99)
POTASSIUM: 5.3 mmol/L — AB (ref 3.5–5.1)
Sodium: 136 mmol/L (ref 135–145)

## 2014-05-28 LAB — GLUCOSE, CAPILLARY
GLUCOSE-CAPILLARY: 117 mg/dL — AB (ref 65–99)
GLUCOSE-CAPILLARY: 208 mg/dL — AB (ref 65–99)
GLUCOSE-CAPILLARY: 85 mg/dL (ref 65–99)
Glucose-Capillary: 150 mg/dL — ABNORMAL HIGH (ref 65–99)
Glucose-Capillary: 97 mg/dL (ref 65–99)
Glucose-Capillary: 81 mg/dL (ref 65–99)

## 2014-05-28 LAB — POCT I-STAT 3, ART BLOOD GAS (G3+)
ACID-BASE DEFICIT: 8 mmol/L — AB (ref 0.0–2.0)
Bicarbonate: 17 mEq/L — ABNORMAL LOW (ref 20.0–24.0)
O2 Saturation: 99 %
Patient temperature: 98.7
TCO2: 18 mmol/L (ref 0–100)
pCO2 arterial: 32.4 mmHg — ABNORMAL LOW (ref 35.0–45.0)
pH, Arterial: 7.328 — ABNORMAL LOW (ref 7.350–7.450)
pO2, Arterial: 156 mmHg — ABNORMAL HIGH (ref 80.0–100.0)

## 2014-05-28 LAB — PHOSPHORUS: Phosphorus: 5.4 mg/dL — ABNORMAL HIGH (ref 2.5–4.6)

## 2014-05-28 LAB — HEPARIN LEVEL (UNFRACTIONATED)
HEPARIN UNFRACTIONATED: 0.59 [IU]/mL (ref 0.30–0.70)
Heparin Unfractionated: >2.2 IU/mL — ABNORMAL HIGH (ref 0.30–0.70)
Heparin Unfractionated: 0.65 IU/mL (ref 0.30–0.70)
Heparin Unfractionated: >2.2 IU/mL — ABNORMAL HIGH (ref 0.30–0.70)

## 2014-05-28 LAB — TROPONIN I: TROPONIN I: 0.37 ng/mL — AB (ref <0.031)

## 2014-05-28 LAB — MAGNESIUM: Magnesium: 2 mg/dL (ref 1.7–2.4)

## 2014-05-28 LAB — PROTEIN C, TOTAL: Protein C, Total: 60 % — ABNORMAL LOW (ref 70–140)

## 2014-05-28 MED ORDER — SODIUM CHLORIDE 0.9 % IV SOLN
12.0000 mg | Freq: Once | INTRAVENOUS | Status: AC
  Filled 2014-05-28: qty 12

## 2014-05-28 MED ORDER — SODIUM CHLORIDE 0.9 % IV SOLN
12.0000 mg | Freq: Once | INTRAVENOUS | Status: DC
  Administered 2014-05-28: 12 mg via INTRAVENOUS
  Filled 2014-05-28: qty 12

## 2014-05-28 MED ORDER — LORAZEPAM 2 MG/ML IJ SOLN
0.2500 mg | Freq: Once | INTRAMUSCULAR | Status: AC
  Administered 2014-05-28: 0.25 mg via INTRAVENOUS
  Filled 2014-05-28: qty 1

## 2014-05-28 MED ORDER — PANTOPRAZOLE SODIUM 40 MG IV SOLR
40.0000 mg | INTRAVENOUS | Status: DC
  Administered 2014-05-28 – 2014-05-29 (×2): 40 mg via INTRAVENOUS
  Filled 2014-05-28 (×5): qty 40

## 2014-05-28 MED ORDER — HEPARIN (PORCINE) IN NACL 100-0.45 UNIT/ML-% IJ SOLN
2000.0000 [IU]/h | INTRAMUSCULAR | Status: DC
  Administered 2014-05-29: 1500 [IU]/h via INTRAVENOUS
  Administered 2014-05-31: 2000 [IU]/h via INTRAVENOUS
  Filled 2014-05-28 (×9): qty 250

## 2014-05-28 MED ORDER — WHITE PETROLATUM GEL
Status: AC
  Administered 2014-05-28: 1
  Filled 2014-05-28: qty 1

## 2014-05-28 NOTE — Progress Notes (Signed)
Referring Physician(s): CCM  Subjective:  B PE with R heart strain EKOS lysis initiated 730 pm 5/24 tpa off now/ saline running Pt does feel she is breathing easier now O2 sat 100% non re breather No complaints  Allergies: Review of patient's allergies indicates no known allergies.  Medications: Prior to Admission medications   Medication Sig Start Date End Date Taking? Authorizing Provider  albuterol (PROVENTIL HFA;VENTOLIN HFA) 108 (90 BASE) MCG/ACT inhaler Inhale 1-2 puffs into the lungs every 6 (six) hours as needed for wheezing. 01/22/12  Yes Nelva Nay, MD  ALPRAZolam Prudy Feeler) 0.25 MG tablet Take 0.25 mg by mouth 2 (two) times daily as needed for anxiety.   Yes Historical Provider, MD  amLODipine (NORVASC) 10 MG tablet Take 10 mg by mouth daily.   Yes Historical Provider, MD  aspirin EC 81 MG EC tablet Take 1 tablet (81 mg total) by mouth daily. 07/10/12  Yes Shanker Levora Dredge, MD  BIOTIN PO Take 500 mcg by mouth daily.   Yes Historical Provider, MD  buPROPion (WELLBUTRIN) 100 MG tablet Take 100 mg by mouth 2 (two) times daily.   Yes Historical Provider, MD  glimepiride (AMARYL) 4 MG tablet Take 4 mg by mouth daily with breakfast.   Yes Historical Provider, MD  hydrochlorothiazide (HYDRODIURIL) 25 MG tablet Take 25 mg by mouth daily.   Yes Historical Provider, MD  lisinopril (PRINIVIL,ZESTRIL) 40 MG tablet Take 40 mg by mouth daily.   Yes Historical Provider, MD  metFORMIN (GLUCOPHAGE) 500 MG tablet Take 1 tablet (500 mg total) by mouth 2 (two) times daily with a meal. 07/11/12  Yes Clydia Llano, MD  mometasone-formoterol (DULERA) 100-5 MCG/ACT AERO Inhale 2 puffs into the lungs 2 (two) times daily.   Yes Historical Provider, MD  Multiple Vitamins-Minerals (MULTIVITAMIN WITH MINERALS) tablet Take 1 tablet by mouth daily.   Yes Historical Provider, MD  pantoprazole (PROTONIX) 40 MG tablet Take 40 mg by mouth daily as needed.    Yes Historical Provider, MD  amLODipine-olmesartan  (AZOR) 5-20 MG per tablet Take 1 tablet by mouth daily. Patient not taking: Reported on 05/27/2014 05/13/14   Nyoka Cowden, MD     Vital Signs: BP 127/57 mmHg  Pulse 34  Temp(Src) 97.4 F (36.3 C) (Axillary)  Resp 19  Ht  (1.702 m)  Wt 140 kg (308 lb 10.3 oz)  BMI 48.33 kg/m2  SpO2 100%  Physical Exam  Constitutional: She is oriented to person, place, and time. She appears well-developed.  H/H stable Bun/Cr: 33/2.07 (26/1.28)  Cardiovascular: Normal rate and regular rhythm.   Pulmonary/Chest: Effort normal and breath sounds normal. No respiratory distress. She has no wheezes.  Abdominal: Soft. Bowel sounds are normal.  Musculoskeletal: Normal range of motion.  Neurological: She is alert and oriented to person, place, and time.  Skin: Skin is warm and dry.  Rt groin site clean and dry No bleeding No hematoma  Psychiatric: She has a normal mood and affect. Her behavior is normal. Judgment and thought content normal.  Nursing note and vitals reviewed.   Imaging: Ct Angio Chest Pe W/cm &/or Wo Cm  05/27/2014   CLINICAL DATA:  Shortness of breath for several days  EXAM: CT ANGIOGRAPHY CHEST WITH CONTRAST  TECHNIQUE: Multidetector CT imaging of the chest was performed using the standard protocol during bolus administration of intravenous contrast. Multiplanar CT image reconstructions and MIPs were obtained to evaluate the vascular anatomy.  CONTRAST:  OMNIPAQUE IOHEXOL 350 MG/ML SOLN  COMPARISON:  None.  FINDINGS: The lungs are well aerated bilaterally. No focal infiltrate or sizable effusion is seen.  The thoracic inlet is within normal limits. Mild calcifications of the thoracic aorta are noted without aneurysmal dilatation. Pulmonary artery demonstrates significant pulmonary emboli as well as a focal saddle embolus traversing the bifurcation of the main pulmonary artery. Evidence of right heart strain is noted with dilatation of right ventricle.  No hilar or mediastinal  adenopathy is noted. The visualized upper abdomen is within normal limits. No acute bony abnormality is noted. Degenerative changes of thoracic spine are seen.  Review of the MIP images confirms the above findings.  IMPRESSION: Positive for acute PE with CT evidence of right heart strain (RV/LV Ratio = 1.12) consistent with at least submassive (intermediate risk) PE. The presence of right heart strain has been associated with an increased risk of morbidity and mortality. Please activate Code PE by paging [phone].  Critical Value/emergent results were called by telephone at the time of interpretation on 05/27/2014 at 3:16 pm to Dr. Purvis SheffieldFORREST HARRISON , who verbally acknowledged these results.   Electronically Signed   By: Alcide CleverMark  Lukens M.D.   On: 05/27/2014 15:19   Dg Chest Port 1 View  05/27/2014   CLINICAL DATA:  Four day history of shortness of breath  EXAM: PORTABLE CHEST - 1 VIEW  COMPARISON:  May 20, 2013  FINDINGS: There is left lower lobe airspace consolidation. Lungs are otherwise clear. Heart is mildly enlarged with pulmonary vascularity within normal limits. No adenopathy. No bone lesions. Left  IMPRESSION: Left lower lobe consolidation.  Mild cardiac prominence, stable.   Electronically Signed   By: Bretta BangWilliam  Woodruff III M.D.   On: 05/27/2014 11:59    Labs:  CBC:  Recent Labs  05/27/14 1139 05/27/14 2230 05/28/14 0350  WBC 14.7* 16.9* 16.1*  16.0*  HGB 11.2* 11.5* 11.2*  11.3*  HCT 34.9* 36.7 34.6*  34.7*  PLT 190 129* 123*  123*    COAGS:  Recent Labs  05/27/14 1744  INR 1.52*    BMP:  Recent Labs  05/27/14 1139 05/28/14 0350  NA 135 136  K 4.4 5.3*  CL 102 107  CO2 19* 18*  GLUCOSE 268* 160*  BUN 26* 33*  CALCIUM 9.2 8.3*  CREATININE 1.28* 2.07*  GFRNONAA 43* 24*  GFRAA 50* 28*    LIVER FUNCTION TESTS: No results for input(s): BILITOT, AST, ALT, ALKPHOS, PROT, ALBUMIN in the last 8760 hours.  Assessment and Plan:  B PE EKOS lysis 5/24 pm Feeling  better- breathing easier CCM to see pt--rounding now To determine of need continued lysis- fibrinogen wnl To return to IR later today  Signed: Delainee Tramel A 05/28/2014, 8:58 AM   I spent a total of 15 Minutes in face to face in clinical consultation/evaluation, greater than 50% of which was counseling/coordinating care for PE lysis

## 2014-05-28 NOTE — Progress Notes (Signed)
RT note- patient is awake and alert, removed Bipap for 2 hour break if tolerated.

## 2014-05-28 NOTE — H&P (Addendum)
PULMONARY / CRITICAL CARE MEDICINE   Name: [name] MRN: [id number] DOB: [birth year]    ADMISSION DATE:  05/27/2014 CONSULTATION DATE:  05/28/2014  REFERRING MD :  EDP  CHIEF COMPLAINT:  SOB  INITIAL PRESENTATION:  64 y.o. F brought to [facility] ED 5/24 with severe SOB.  Found to have PE with CT evidence of RHS.  PCCM called for admission.     STUDIES:  CTA chest 5/24 >>> acute PE with focal saddle embolus and extending bilaterally, RV/LV ratio of 1.12 Echo 5/24 >>> LE duplex 5/24 >>>  SIGNIFICANT EVENTS: 5/24 - admitted with extensive PE. IR consulted for EKOS 5/25 - bipap, hypoxia   SUBJECTIVE: on BIPAP, some elevated rr  VITAL SIGNS: Temp:  [94 F (34.4 C)-98.4 F (36.9 C)] 97.4 F (36.3 C) (05/25 0827) Pulse Rate:  [28-124] 34 (05/25 0800) Resp:  [8-55] 19 (05/25 0800) BP: (76-169)/(22-108) 127/57 mmHg (05/25 0800) SpO2:  [80 %-100 %] 100 % (05/25 0800) FiO2 (%):  [50 %-100 %] 50 % (05/25 0754) Weight:  [140 kg (308 lb 10.3 oz)-140.161 kg (309 lb)] 140 kg (308 lb 10.3 oz) (05/25 0349) HEMODYNAMICS:   VENTILATOR SETTINGS: Vent Mode:  [-] BIPAP FiO2 (%):  [50 %-100 %] 50 % PEEP:  [5 cmH20] 5 cmH20 INTAKE / OUTPUT: Intake/Output      05/24 0701 - 05/25 0700 05/25 0701 - 05/26 0700   P.O. 220    I.V. (mL/kg) 5269.8 (37.6) 270 (1.9)   Total Intake(mL/kg) 5489.8 (39.2) 270 (1.9)   Urine (mL/kg/hr) 240 45 (0.1)   Total Output 240 45   Net +5249.8 +225          PHYSICAL EXAMINATION: General: Morbidly obese female, in mild respiratory distress on BIPAP Neuro: A&O x 3, non-focal.  HEENT: [place]/AT. PERRL Cardiovascular: s1 s2 RRR distant Lungs: Respirations shallow distant on BIPAP Abdomen: Obese, BS active, soft, NT/ND.  Musculoskeletal: No gross deformities, no edema.  Skin: Intact, warm, no rashes.  LABS:  CBC  Recent Labs Lab 05/27/14 1139 05/27/14 2230 05/28/14 0350  WBC 14.7* 16.9* 16.1*  16.0*  HGB 11.2* 11.5* 11.2*  11.3*  HCT 34.9* 36.7  34.6*  34.7*  PLT 190 129* 123*  123*   Coag's  Recent Labs Lab 05/27/14 1744  INR 1.52*   BMET  Recent Labs Lab 05/27/14 1139 05/28/14 0350  NA 135 136  K 4.4 5.3*  CL 102 107  CO2 19* 18*  BUN 26* 33*  CREATININE 1.28* 2.07*  GLUCOSE 268* 160*   Electrolytes  Recent Labs Lab 05/27/14 1139 05/28/14 0350  CALCIUM 9.2 8.3*  MG  --  2.0  PHOS  --  5.4*   Sepsis Markers No results for input(s): LATICACIDVEN, PROCALCITON, O2SATVEN in the last 168 hours. ABG No results for input(s): PHART, PCO2ART, PO2ART in the last 168 hours. Liver Enzymes No results for input(s): AST, ALT, ALKPHOS, BILITOT, ALBUMIN in the last 168 hours. Cardiac Enzymes  Recent Labs Lab 05/27/14 1745 05/27/14 2230 05/28/14 0350  TROPONINI 0.06* 0.14* 0.37*   Glucose  Recent Labs Lab 05/27/14 1210 05/27/14 2021 05/27/14 2347 05/28/14 0346 05/28/14 0823  GLUCAP 249* 312* 208* 150* 117*    Imaging Ct Angio Chest Pe W/cm &/or Wo Cm  05/27/2014   CLINICAL DATA:  Shortness of breath for several days  EXAM: CT ANGIOGRAPHY CHEST WITH CONTRAST  TECHNIQUE: Multidetector CT imaging of the chest was performed using the standard protocol during bolus administration of intravenous contrast. Multiplanar CT  image reconstructions and MIPs were obtained to evaluate the vascular anatomy.  CONTRAST:  OMNIPAQUE IOHEXOL 350 MG/ML SOLN  COMPARISON:  None.  FINDINGS: The lungs are well aerated bilaterally. No focal infiltrate or sizable effusion is seen.  The thoracic inlet is within normal limits. Mild calcifications of the thoracic aorta are noted without aneurysmal dilatation. Pulmonary artery demonstrates significant pulmonary emboli as well as a focal saddle embolus traversing the bifurcation of the main pulmonary artery. Evidence of right heart strain is noted with dilatation of right ventricle.  No hilar or mediastinal adenopathy is noted. The visualized upper abdomen is within normal limits. No  acute bony abnormality is noted. Degenerative changes of thoracic spine are seen.  Review of the MIP images confirms the above findings.  IMPRESSION: Positive for acute PE with CT evidence of right heart strain (RV/LV Ratio = 1.12) consistent with at least submassive (intermediate risk) PE. The presence of right heart strain has been associated with an increased risk of morbidity and mortality. Please activate Code PE by paging [phone].  Critical Value/emergent results were called by telephone at the time of interpretation on 05/27/2014 at 3:16 pm to Dr. Purvis Sheffield , who verbally acknowledged these results.   Electronically Signed   By: Alcide Clever M.D.   On: 05/27/2014 15:19   Ir Angiogram Pulmonary Bilateral Selective  05/28/2014   CLINICAL DATA:  64 year old female with acute sub massive bordering on massive (borderline hypotension) large volume PE and CT evidence of right heart strain with significant respiratory distress and difficulty in maintaining oxygenation. Urgent ultrasound accelerated thrombolysis is warranted to facilitate unloading of the pulmonary arterial pressure and right heart strain.  EXAM: BILATERAL PULMONARY ARTERIOGRAPHY; ADDITIONAL ARTERIOGRAPHY; IR ULTRASOUND GUIDANCE VASC ACCESS RIGHT; IR INFUSION THROMBOL VENOUS INITIAL (MS)  Date: 05/28/2014  PROCEDURE: 1. Ultrasound guided right common femoral venous access 2. Second ultrasound-guided right common femoral venous access 3. Main pulmonary artery pressure measurement 4. Left pulmonary arteriogram 5. Right pulmonary arteriogram 6. EKOS catheter placement in the right lower lobe pulmonary artery 7. EKOS catheter placement in the left lower lobe pulmonary artery 8. Initiation of bilateral pulmonary arterial thrombolysis Interventional Radiologist:  Sterling Big, MD  ANESTHESIA/SEDATION: Moderate (conscious) sedation was used. 0.5 mg Versed, 25 mcg Fentanyl were administered intravenously. The patient's vital signs were  monitored continuously by radiology nursing throughout the procedure.  Sedation Time: 30 minutes  MEDICATIONS: TPA begun at 0.5 mg/hr in each catheter for a total of 1 mg/hr  FLUOROSCOPY TIME:  11 minutes 12 seconds  609 mGy  CONTRAST:  30mL OMNIPAQUE IOHEXOL 300 MG/ML  SOLN  TECHNIQUE: Informed consent was obtained from the patient following explanation of the procedure, risks, benefits and alternatives. The patient understands, agrees and consents for the procedure. All questions were addressed. A time out was performed.  Maximal barrier sterile technique utilized including caps, mask, sterile gowns, sterile gloves, large sterile drape, hand hygiene, and Betadine skin prep.  The right groin was interrogated with ultrasound. The common femoral vein is distended and compressible. There is no evidence of thrombus. An image was obtained and stored for the medical record. Local anesthesia was attained by infiltration with 1% lidocaine. Two small dermatotomy server made. Using ultrasound guidance, the vessel was punctured with a 21 gauge micropuncture needle. With the assistance of a micro wire, a 5 [place] transitional sheath was advanced into the vessel.  A second common femoral access was then obtained again using real-time sonographic guidance and a 21  gauge micropuncture needle and transitional 5 French micro sheath system. Both micro sheaths were then exchanged sequentially over 035 wires for working 6 [place] vascular sheaths.  A 5 French vert catheter was then navigated into the main pulmonary artery over a Bentson wire. Main pulmonary arterial pressures were obtained. The pulmonary arterial pressure was 74/39 for a mean of 50 mmHg. The 5 French catheter was next used to select the left main pulmonary artery. A limited left pulmonary arteriogram was performed. There is significant clot burden in the branches to the lower lobe. The catheter was navigated into the distal left lower lobe pulmonary artery. A Rosen  wire was advanced into the distal left lower lobe pulmonary artery in the 5 French catheter removed. The 5 French catheter was then inserted into the second 6 [place] vascular sheath an again navigated into the main pulmonary artery over a Bentson wire. This time, the catheter was used to select the right main pulmonary artery. A limited right pulmonary arteriogram was performed demonstrating occlusive clot in the right lower lobe pulmonary artery confirming the findings of the recent CT scan. There is very poor perfusion of the right lung.  The catheter was advanced over the Bentson wire into the distal right lower lobe pulmonary artery. The Bentson wire was exchanged for a Rosen wire in the 5 [place] catheter removed. An 18 cm EKOS infusion catheter was then advanced over the wire and positioned in the right lower lobe pulmonary artery. The proximal aspect of the catheter was allowed to come back into the main pulmonary trunk to facilitate dating of the saddle embolus.  A 12 cm EKOS infusion catheter was then advanced over the second wire and positioned in the left lower lobe pulmonary artery.  The sheaths were secured in place with sterile bandages. Pulmonary arterial lysis was then initiated per standard protocol.  COMPLICATIONS: None  IMPRESSION: 1. Main pulmonary arterial pressure was 74/39 for a mean of 50 mmHg consistent with critical pulmonary arterial hypertension. 2. Bilateral pulmonary arteriography demonstrates large volume occlusive clot in the central right lower lobe pulmonary artery, and high burden of subocclusive caught throughout the left lower extremity pulmonary artery. 3. Initiation of bilateral ultrasound accelerated thrombolysis with EKOS catheters per code PE protocol.  Signed,  Sterling Big, MD  [facility]  [facility]   Electronically Signed   By: Malachy Moan M.D.   On: 05/28/2014 09:08   Ir Angiogram Selective Each Additional  Vessel  05/28/2014   CLINICAL DATA:  64 year old female with acute sub massive bordering on massive (borderline hypotension) large volume PE and CT evidence of right heart strain with significant respiratory distress and difficulty in maintaining oxygenation. Urgent ultrasound accelerated thrombolysis is warranted to facilitate unloading of the pulmonary arterial pressure and right heart strain.  EXAM: BILATERAL PULMONARY ARTERIOGRAPHY; ADDITIONAL ARTERIOGRAPHY; IR ULTRASOUND GUIDANCE VASC ACCESS RIGHT; IR INFUSION THROMBOL VENOUS INITIAL (MS)  Date: 05/28/2014  PROCEDURE: 1. Ultrasound guided right common femoral venous access 2. Second ultrasound-guided right common femoral venous access 3. Main pulmonary artery pressure measurement 4. Left pulmonary arteriogram 5. Right pulmonary arteriogram 6. EKOS catheter placement in the right lower lobe pulmonary artery 7. EKOS catheter placement in the left lower lobe pulmonary artery 8. Initiation of bilateral pulmonary arterial thrombolysis Interventional Radiologist:  Sterling Big, MD  ANESTHESIA/SEDATION: Moderate (conscious) sedation was used. 0.5 mg Versed, 25 mcg Fentanyl were administered intravenously. The patient's vital signs were monitored continuously by radiology nursing throughout the  procedure.  Sedation Time: 30 minutes  MEDICATIONS: TPA begun at 0.5 mg/hr in each catheter for a total of 1 mg/hr  FLUOROSCOPY TIME:  11 minutes 12 seconds  609 mGy  CONTRAST:  30mL OMNIPAQUE IOHEXOL 300 MG/ML  SOLN  TECHNIQUE: Informed consent was obtained from the patient following explanation of the procedure, risks, benefits and alternatives. The patient understands, agrees and consents for the procedure. All questions were addressed. A time out was performed.  Maximal barrier sterile technique utilized including caps, mask, sterile gowns, sterile gloves, large sterile drape, hand hygiene, and Betadine skin prep.  The right groin was interrogated with ultrasound. The  common femoral vein is distended and compressible. There is no evidence of thrombus. An image was obtained and stored for the medical record. Local anesthesia was attained by infiltration with 1% lidocaine. Two small dermatotomy server made. Using ultrasound guidance, the vessel was punctured with a 21 gauge micropuncture needle. With the assistance of a micro wire, a 5 [place] transitional sheath was advanced into the vessel.  A second common femoral access was then obtained again using real-time sonographic guidance and a 21 gauge micropuncture needle and transitional 5 French micro sheath system. Both micro sheaths were then exchanged sequentially over 035 wires for working 6 [place] vascular sheaths.  A 5 French vert catheter was then navigated into the main pulmonary artery over a Bentson wire. Main pulmonary arterial pressures were obtained. The pulmonary arterial pressure was 74/39 for a mean of 50 mmHg. The 5 French catheter was next used to select the left main pulmonary artery. A limited left pulmonary arteriogram was performed. There is significant clot burden in the branches to the lower lobe. The catheter was navigated into the distal left lower lobe pulmonary artery. A Rosen wire was advanced into the distal left lower lobe pulmonary artery in the 5 French catheter removed. The 5 French catheter was then inserted into the second 6 [place] vascular sheath an again navigated into the main pulmonary artery over a Bentson wire. This time, the catheter was used to select the right main pulmonary artery. A limited right pulmonary arteriogram was performed demonstrating occlusive clot in the right lower lobe pulmonary artery confirming the findings of the recent CT scan. There is very poor perfusion of the right lung.  The catheter was advanced over the Bentson wire into the distal right lower lobe pulmonary artery. The Bentson wire was exchanged for a Rosen wire in the 5 [place] catheter removed. An 18 cm EKOS  infusion catheter was then advanced over the wire and positioned in the right lower lobe pulmonary artery. The proximal aspect of the catheter was allowed to come back into the main pulmonary trunk to facilitate dating of the saddle embolus.  A 12 cm EKOS infusion catheter was then advanced over the second wire and positioned in the left lower lobe pulmonary artery.  The sheaths were secured in place with sterile bandages. Pulmonary arterial lysis was then initiated per standard protocol.  COMPLICATIONS: None  IMPRESSION: 1. Main pulmonary arterial pressure was 74/39 for a mean of 50 mmHg consistent with critical pulmonary arterial hypertension. 2. Bilateral pulmonary arteriography demonstrates large volume occlusive clot in the central right lower lobe pulmonary artery, and high burden of subocclusive caught throughout the left lower extremity pulmonary artery. 3. Initiation of bilateral ultrasound accelerated thrombolysis with EKOS catheters per code PE protocol.  Signed,  Sterling Big, MD  [facility]  [facility]   Electronically  Signed   By: Malachy Moan M.D.   On: 05/28/2014 09:08   Ir Angiogram Selective Each Additional Vessel  05/28/2014   CLINICAL DATA:  64 year old female with acute sub massive bordering on massive (borderline hypotension) large volume PE and CT evidence of right heart strain with significant respiratory distress and difficulty in maintaining oxygenation. Urgent ultrasound accelerated thrombolysis is warranted to facilitate unloading of the pulmonary arterial pressure and right heart strain.  EXAM: BILATERAL PULMONARY ARTERIOGRAPHY; ADDITIONAL ARTERIOGRAPHY; IR ULTRASOUND GUIDANCE VASC ACCESS RIGHT; IR INFUSION THROMBOL VENOUS INITIAL (MS)  Date: 05/28/2014  PROCEDURE: 1. Ultrasound guided right common femoral venous access 2. Second ultrasound-guided right common femoral venous access 3. Main pulmonary artery pressure  measurement 4. Left pulmonary arteriogram 5. Right pulmonary arteriogram 6. EKOS catheter placement in the right lower lobe pulmonary artery 7. EKOS catheter placement in the left lower lobe pulmonary artery 8. Initiation of bilateral pulmonary arterial thrombolysis Interventional Radiologist:  Sterling Big, MD  ANESTHESIA/SEDATION: Moderate (conscious) sedation was used. 0.5 mg Versed, 25 mcg Fentanyl were administered intravenously. The patient's vital signs were monitored continuously by radiology nursing throughout the procedure.  Sedation Time: 30 minutes  MEDICATIONS: TPA begun at 0.5 mg/hr in each catheter for a total of 1 mg/hr  FLUOROSCOPY TIME:  11 minutes 12 seconds  609 mGy  CONTRAST:  30mL OMNIPAQUE IOHEXOL 300 MG/ML  SOLN  TECHNIQUE: Informed consent was obtained from the patient following explanation of the procedure, risks, benefits and alternatives. The patient understands, agrees and consents for the procedure. All questions were addressed. A time out was performed.  Maximal barrier sterile technique utilized including caps, mask, sterile gowns, sterile gloves, large sterile drape, hand hygiene, and Betadine skin prep.  The right groin was interrogated with ultrasound. The common femoral vein is distended and compressible. There is no evidence of thrombus. An image was obtained and stored for the medical record. Local anesthesia was attained by infiltration with 1% lidocaine. Two small dermatotomy server made. Using ultrasound guidance, the vessel was punctured with a 21 gauge micropuncture needle. With the assistance of a micro wire, a 5 [place] transitional sheath was advanced into the vessel.  A second common femoral access was then obtained again using real-time sonographic guidance and a 21 gauge micropuncture needle and transitional 5 French micro sheath system. Both micro sheaths were then exchanged sequentially over 035 wires for working 6 [place] vascular sheaths.  A 5 French vert  catheter was then navigated into the main pulmonary artery over a Bentson wire. Main pulmonary arterial pressures were obtained. The pulmonary arterial pressure was 74/39 for a mean of 50 mmHg. The 5 French catheter was next used to select the left main pulmonary artery. A limited left pulmonary arteriogram was performed. There is significant clot burden in the branches to the lower lobe. The catheter was navigated into the distal left lower lobe pulmonary artery. A Rosen wire was advanced into the distal left lower lobe pulmonary artery in the 5 French catheter removed. The 5 French catheter was then inserted into the second 6 [place] vascular sheath an again navigated into the main pulmonary artery over a Bentson wire. This time, the catheter was used to select the right main pulmonary artery. A limited right pulmonary arteriogram was performed demonstrating occlusive clot in the right lower lobe pulmonary artery confirming the findings of the recent CT scan. There is very poor perfusion of the right lung.  The catheter was advanced over the Bentson wire into the  distal right lower lobe pulmonary artery. The Bentson wire was exchanged for a Rosen wire in the 5 [place] catheter removed. An 18 cm EKOS infusion catheter was then advanced over the wire and positioned in the right lower lobe pulmonary artery. The proximal aspect of the catheter was allowed to come back into the main pulmonary trunk to facilitate dating of the saddle embolus.  A 12 cm EKOS infusion catheter was then advanced over the second wire and positioned in the left lower lobe pulmonary artery.  The sheaths were secured in place with sterile bandages. Pulmonary arterial lysis was then initiated per standard protocol.  COMPLICATIONS: None  IMPRESSION: 1. Main pulmonary arterial pressure was 74/39 for a mean of 50 mmHg consistent with critical pulmonary arterial hypertension. 2. Bilateral pulmonary arteriography demonstrates large volume occlusive  clot in the central right lower lobe pulmonary artery, and high burden of subocclusive caught throughout the left lower extremity pulmonary artery. 3. Initiation of bilateral ultrasound accelerated thrombolysis with EKOS catheters per code PE protocol.  Signed,  Sterling Big, MD  [facility]  [facility]   Electronically Signed   By: Malachy Moan M.D.   On: 05/28/2014 09:08   Ir US Guide Vasc Access Right  05/28/2014   CLINICAL DATA:  64 year old female with acute sub massive bordering on massive (borderline hypotension) large volume PE and CT evidence of right heart strain with significant respiratory distress and difficulty in maintaining oxygenation. Urgent ultrasound accelerated thrombolysis is warranted to facilitate unloading of the pulmonary arterial pressure and right heart strain.  EXAM: BILATERAL PULMONARY ARTERIOGRAPHY; ADDITIONAL ARTERIOGRAPHY; IR ULTRASOUND GUIDANCE VASC ACCESS RIGHT; IR INFUSION THROMBOL VENOUS INITIAL (MS)  Date: 05/28/2014  PROCEDURE: 1. Ultrasound guided right common femoral venous access 2. Second ultrasound-guided right common femoral venous access 3. Main pulmonary artery pressure measurement 4. Left pulmonary arteriogram 5. Right pulmonary arteriogram 6. EKOS catheter placement in the right lower lobe pulmonary artery 7. EKOS catheter placement in the left lower lobe pulmonary artery 8. Initiation of bilateral pulmonary arterial thrombolysis Interventional Radiologist:  Sterling Big, MD  ANESTHESIA/SEDATION: Moderate (conscious) sedation was used. 0.5 mg Versed, 25 mcg Fentanyl were administered intravenously. The patient's vital signs were monitored continuously by radiology nursing throughout the procedure.  Sedation Time: 30 minutes  MEDICATIONS: TPA begun at 0.5 mg/hr in each catheter for a total of 1 mg/hr  FLUOROSCOPY TIME:  11 minutes 12 seconds  609 mGy  CONTRAST:  30mL OMNIPAQUE IOHEXOL 300 MG/ML  SOLN   TECHNIQUE: Informed consent was obtained from the patient following explanation of the procedure, risks, benefits and alternatives. The patient understands, agrees and consents for the procedure. All questions were addressed. A time out was performed.  Maximal barrier sterile technique utilized including caps, mask, sterile gowns, sterile gloves, large sterile drape, hand hygiene, and Betadine skin prep.  The right groin was interrogated with ultrasound. The common femoral vein is distended and compressible. There is no evidence of thrombus. An image was obtained and stored for the medical record. Local anesthesia was attained by infiltration with 1% lidocaine. Two small dermatotomy server made. Using ultrasound guidance, the vessel was punctured with a 21 gauge micropuncture needle. With the assistance of a micro wire, a 5 [place] transitional sheath was advanced into the vessel.  A second common femoral access was then obtained again using real-time sonographic guidance and a 21 gauge micropuncture needle and transitional 5 French micro sheath system. Both micro sheaths were then exchanged  sequentially over 035 wires for working 6 [place] vascular sheaths.  A 5 French vert catheter was then navigated into the main pulmonary artery over a Bentson wire. Main pulmonary arterial pressures were obtained. The pulmonary arterial pressure was 74/39 for a mean of 50 mmHg. The 5 French catheter was next used to select the left main pulmonary artery. A limited left pulmonary arteriogram was performed. There is significant clot burden in the branches to the lower lobe. The catheter was navigated into the distal left lower lobe pulmonary artery. A Rosen wire was advanced into the distal left lower lobe pulmonary artery in the 5 French catheter removed. The 5 French catheter was then inserted into the second 6 [place] vascular sheath an again navigated into the main pulmonary artery over a Bentson wire. This time, the catheter was  used to select the right main pulmonary artery. A limited right pulmonary arteriogram was performed demonstrating occlusive clot in the right lower lobe pulmonary artery confirming the findings of the recent CT scan. There is very poor perfusion of the right lung.  The catheter was advanced over the Bentson wire into the distal right lower lobe pulmonary artery. The Bentson wire was exchanged for a Rosen wire in the 5 [place] catheter removed. An 18 cm EKOS infusion catheter was then advanced over the wire and positioned in the right lower lobe pulmonary artery. The proximal aspect of the catheter was allowed to come back into the main pulmonary trunk to facilitate dating of the saddle embolus.  A 12 cm EKOS infusion catheter was then advanced over the second wire and positioned in the left lower lobe pulmonary artery.  The sheaths were secured in place with sterile bandages. Pulmonary arterial lysis was then initiated per standard protocol.  COMPLICATIONS: None  IMPRESSION: 1. Main pulmonary arterial pressure was 74/39 for a mean of 50 mmHg consistent with critical pulmonary arterial hypertension. 2. Bilateral pulmonary arteriography demonstrates large volume occlusive clot in the central right lower lobe pulmonary artery, and high burden of subocclusive caught throughout the left lower extremity pulmonary artery. 3. Initiation of bilateral ultrasound accelerated thrombolysis with EKOS catheters per code PE protocol.  Signed,  Sterling BigHeath K. McCullough, MD  [facility]  [facility]   Electronically Signed   By: Malachy MoanHeath  McCullough M.D.   On: 05/28/2014 09:08   Ir Koreas Guide Vasc Access Right  05/28/2014   CLINICAL DATA:  64 year old female with acute sub massive bordering on massive (borderline hypotension) large volume PE and CT evidence of right heart strain with significant respiratory distress and difficulty in maintaining oxygenation. Urgent ultrasound accelerated  thrombolysis is warranted to facilitate unloading of the pulmonary arterial pressure and right heart strain.  EXAM: BILATERAL PULMONARY ARTERIOGRAPHY; ADDITIONAL ARTERIOGRAPHY; IR ULTRASOUND GUIDANCE VASC ACCESS RIGHT; IR INFUSION THROMBOL VENOUS INITIAL (MS)  Date: 05/28/2014  PROCEDURE: 1. Ultrasound guided right common femoral venous access 2. Second ultrasound-guided right common femoral venous access 3. Main pulmonary artery pressure measurement 4. Left pulmonary arteriogram 5. Right pulmonary arteriogram 6. EKOS catheter placement in the right lower lobe pulmonary artery 7. EKOS catheter placement in the left lower lobe pulmonary artery 8. Initiation of bilateral pulmonary arterial thrombolysis Interventional Radiologist:  Sterling BigHeath K. McCullough, MD  ANESTHESIA/SEDATION: Moderate (conscious) sedation was used. 0.5 mg Versed, 25 mcg Fentanyl were administered intravenously. The patient's vital signs were monitored continuously by radiology nursing throughout the procedure.  Sedation Time: 30 minutes  MEDICATIONS: TPA begun at 0.5 mg/hr in each catheter  for a total of 1 mg/hr  FLUOROSCOPY TIME:  11 minutes 12 seconds  609 mGy  CONTRAST:  30mL OMNIPAQUE IOHEXOL 300 MG/ML  SOLN  TECHNIQUE: Informed consent was obtained from the patient following explanation of the procedure, risks, benefits and alternatives. The patient understands, agrees and consents for the procedure. All questions were addressed. A time out was performed.  Maximal barrier sterile technique utilized including caps, mask, sterile gowns, sterile gloves, large sterile drape, hand hygiene, and Betadine skin prep.  The right groin was interrogated with ultrasound. The common femoral vein is distended and compressible. There is no evidence of thrombus. An image was obtained and stored for the medical record. Local anesthesia was attained by infiltration with 1% lidocaine. Two small dermatotomy server made. Using ultrasound guidance, the vessel was  punctured with a 21 gauge micropuncture needle. With the assistance of a micro wire, a 5 [place] transitional sheath was advanced into the vessel.  A second common femoral access was then obtained again using real-time sonographic guidance and a 21 gauge micropuncture needle and transitional 5 French micro sheath system. Both micro sheaths were then exchanged sequentially over 035 wires for working 6 [place] vascular sheaths.  A 5 French vert catheter was then navigated into the main pulmonary artery over a Bentson wire. Main pulmonary arterial pressures were obtained. The pulmonary arterial pressure was 74/39 for a mean of 50 mmHg. The 5 French catheter was next used to select the left main pulmonary artery. A limited left pulmonary arteriogram was performed. There is significant clot burden in the branches to the lower lobe. The catheter was navigated into the distal left lower lobe pulmonary artery. A Rosen wire was advanced into the distal left lower lobe pulmonary artery in the 5 French catheter removed. The 5 French catheter was then inserted into the second 6 [place] vascular sheath an again navigated into the main pulmonary artery over a Bentson wire. This time, the catheter was used to select the right main pulmonary artery. A limited right pulmonary arteriogram was performed demonstrating occlusive clot in the right lower lobe pulmonary artery confirming the findings of the recent CT scan. There is very poor perfusion of the right lung.  The catheter was advanced over the Bentson wire into the distal right lower lobe pulmonary artery. The Bentson wire was exchanged for a Rosen wire in the 5 [place] catheter removed. An 18 cm EKOS infusion catheter was then advanced over the wire and positioned in the right lower lobe pulmonary artery. The proximal aspect of the catheter was allowed to come back into the main pulmonary trunk to facilitate dating of the saddle embolus.  A 12 cm EKOS infusion catheter was then  advanced over the second wire and positioned in the left lower lobe pulmonary artery.  The sheaths were secured in place with sterile bandages. Pulmonary arterial lysis was then initiated per standard protocol.  COMPLICATIONS: None  IMPRESSION: 1. Main pulmonary arterial pressure was 74/39 for a mean of 50 mmHg consistent with critical pulmonary arterial hypertension. 2. Bilateral pulmonary arteriography demonstrates large volume occlusive clot in the central right lower lobe pulmonary artery, and high burden of subocclusive caught throughout the left lower extremity pulmonary artery. 3. Initiation of bilateral ultrasound accelerated thrombolysis with EKOS catheters per code PE protocol.  Signed,  Sterling Big, MD  [facility]  [facility]   Electronically Signed   By: Malachy Moan M.D.   On: 05/28/2014 09:08   Dg  Chest Port 1 View  05/27/2014   CLINICAL DATA:  Four day history of shortness of breath  EXAM: PORTABLE CHEST - 1 VIEW  COMPARISON:  May 20, 2013  FINDINGS: There is left lower lobe airspace consolidation. Lungs are otherwise clear. Heart is mildly enlarged with pulmonary vascularity within normal limits. No adenopathy. No bone lesions. Left  IMPRESSION: Left lower lobe consolidation.  Mild cardiac prominence, stable.   Electronically Signed   By: Bretta Bang III M.D.   On: 05/27/2014 11:59   Ir Infusion Thrombol Venous Initial (ms)  05/28/2014   CLINICAL DATA:  64 year old female with acute sub massive bordering on massive (borderline hypotension) large volume PE and CT evidence of right heart strain with significant respiratory distress and difficulty in maintaining oxygenation. Urgent ultrasound accelerated thrombolysis is warranted to facilitate unloading of the pulmonary arterial pressure and right heart strain.  EXAM: BILATERAL PULMONARY ARTERIOGRAPHY; ADDITIONAL ARTERIOGRAPHY; IR ULTRASOUND GUIDANCE VASC ACCESS RIGHT; IR INFUSION  THROMBOL VENOUS INITIAL (MS)  Date: 05/28/2014  PROCEDURE: 1. Ultrasound guided right common femoral venous access 2. Second ultrasound-guided right common femoral venous access 3. Main pulmonary artery pressure measurement 4. Left pulmonary arteriogram 5. Right pulmonary arteriogram 6. EKOS catheter placement in the right lower lobe pulmonary artery 7. EKOS catheter placement in the left lower lobe pulmonary artery 8. Initiation of bilateral pulmonary arterial thrombolysis Interventional Radiologist:  Sterling Big, MD  ANESTHESIA/SEDATION: Moderate (conscious) sedation was used. 0.5 mg Versed, 25 mcg Fentanyl were administered intravenously. The patient's vital signs were monitored continuously by radiology nursing throughout the procedure.  Sedation Time: 30 minutes  MEDICATIONS: TPA begun at 0.5 mg/hr in each catheter for a total of 1 mg/hr  FLUOROSCOPY TIME:  11 minutes 12 seconds  609 mGy  CONTRAST:  30mL OMNIPAQUE IOHEXOL 300 MG/ML  SOLN  TECHNIQUE: Informed consent was obtained from the patient following explanation of the procedure, risks, benefits and alternatives. The patient understands, agrees and consents for the procedure. All questions were addressed. A time out was performed.  Maximal barrier sterile technique utilized including caps, mask, sterile gowns, sterile gloves, large sterile drape, hand hygiene, and Betadine skin prep.  The right groin was interrogated with ultrasound. The common femoral vein is distended and compressible. There is no evidence of thrombus. An image was obtained and stored for the medical record. Local anesthesia was attained by infiltration with 1% lidocaine. Two small dermatotomy server made. Using ultrasound guidance, the vessel was punctured with a 21 gauge micropuncture needle. With the assistance of a micro wire, a 5 [place] transitional sheath was advanced into the vessel.  A second common femoral access was then obtained again using real-time sonographic  guidance and a 21 gauge micropuncture needle and transitional 5 French micro sheath system. Both micro sheaths were then exchanged sequentially over 035 wires for working 6 [place] vascular sheaths.  A 5 French vert catheter was then navigated into the main pulmonary artery over a Bentson wire. Main pulmonary arterial pressures were obtained. The pulmonary arterial pressure was 74/39 for a mean of 50 mmHg. The 5 French catheter was next used to select the left main pulmonary artery. A limited left pulmonary arteriogram was performed. There is significant clot burden in the branches to the lower lobe. The catheter was navigated into the distal left lower lobe pulmonary artery. A Rosen wire was advanced into the distal left lower lobe pulmonary artery in the 5 French catheter removed. The 5 French catheter was then inserted into the  second 6 [place] vascular sheath an again navigated into the main pulmonary artery over a Bentson wire. This time, the catheter was used to select the right main pulmonary artery. A limited right pulmonary arteriogram was performed demonstrating occlusive clot in the right lower lobe pulmonary artery confirming the findings of the recent CT scan. There is very poor perfusion of the right lung.  The catheter was advanced over the Bentson wire into the distal right lower lobe pulmonary artery. The Bentson wire was exchanged for a Rosen wire in the 5 [place] catheter removed. An 18 cm EKOS infusion catheter was then advanced over the wire and positioned in the right lower lobe pulmonary artery. The proximal aspect of the catheter was allowed to come back into the main pulmonary trunk to facilitate dating of the saddle embolus.  A 12 cm EKOS infusion catheter was then advanced over the second wire and positioned in the left lower lobe pulmonary artery.  The sheaths were secured in place with sterile bandages. Pulmonary arterial lysis was then initiated per standard protocol.  COMPLICATIONS: None   IMPRESSION: 1. Main pulmonary arterial pressure was 74/39 for a mean of 50 mmHg consistent with critical pulmonary arterial hypertension. 2. Bilateral pulmonary arteriography demonstrates large volume occlusive clot in the central right lower lobe pulmonary artery, and high burden of subocclusive caught throughout the left lower extremity pulmonary artery. 3. Initiation of bilateral ultrasound accelerated thrombolysis with EKOS catheters per code PE protocol.  Signed,  Sterling Big, MD  [facility]  [facility]   Electronically Signed   By: Malachy Moan M.D.   On: 05/28/2014 09:08     ASSESSMENT / PLAN:  PULMONARY A: Saddle PE with CT evidence of right heart strain - RV/LV ratio of 1.12 Probable OSA / OHS Former smoker P:   IR consulted for EKOS, she is declining, may need systemic tpa if major declines, would favor continued ekos given current status Will d/w IR Get stat pcxr abg assessment Continue systemic heparin F/u on echo for rv fxn, ratio on CT was not that imprssive F/u on LE dopplers, if mobile clot then would consider IVC filter, pending Albuterol PRN. Budesonide / Arformoterol in lieu of outpatient Dulera.  May need lasix  HEMATOLOGIC A:   Saddle PE with CT evidence of right heart strain - RV/LV ratio of 1.12 P:  IR consulted for EKOS done, need to continue this Heparin per pharmacy. Hypercoagulable panel sent. CBC in AM  CARDIOVASCULAR A:  Obstructive shock Hx HTN Highly likely to have pulm htn , protective to some extent P:  Goal MAP > 65 Trend troponins. TTE awaited Hold outpatient amlodipine, HCTZ, lisinopril, amlodipine-olmesartan.  RENAL A:   CKD III P:   NS @ 125, reduce given concern edema  May need lasix BMP in AM in afternoon  GASTROINTESTINAL A:   Morbid obesity Nutrition P:   NPO. ensure on ppi  INFECTIOUS A:   Leukocytosis - no indication for infection, likely represents an  acute phase reactant P:   Monitor clinically.  ENDOCRINE A:   DM P:   CBG's q4hr. SSI.  NEUROLOGIC A:   Anxiety P:   Continue outpatient bupropion. Low dose xanax PRN.  Family updated: None.  Interdisciplinary Family Meeting v Palliative Care Meeting:  Due by: 5/30.    Ccm time  6  Min   Mcarthur Rossetti. Tyson Alias, MD, FACP Pgr: [phone] [place] [facility]

## 2014-05-28 NOTE — Progress Notes (Signed)
Per Dr. Tyson AliasFeinstein, 1 time stick ordered for patient even though pt on EKOS. All labs to be collected at once unless vein collapses. MD aware of protocol stated 1 time stick necessary.

## 2014-05-28 NOTE — Progress Notes (Signed)
ANTICOAGULATION CONSULT NOTE - Follow Up Consult  Pharmacy Consult for heparin Indication: PE on EKOS   Labs:  Recent Labs  05/27/14 1139 05/27/14 1744 05/27/14 1745 05/27/14 2230 05/28/14 0350 05/28/14 0623  HGB 11.2*  --   --  11.5* 11.2*  11.3*  --   HCT 34.9*  --   --  36.7 34.6*  34.7*  --   PLT 190  --   --  129* 123*  123*  --   LABPROT  --  18.3*  --   --   --   --   INR  --  1.52*  --   --   --   --   HEPARINUNFRC  --   --   --  0.65 >2.20* 0.65  CREATININE 1.28*  --   --   --  2.07*  --   TROPONINI  --   --  0.06* 0.14* 0.37*  --      Assessment/Plan:  64yo female remains therapeutic on heparin (high level initially was d/t being drawn from same line as heparin running, RN having difficulty getting blood from lines, second level this am was after pausing heparin). Will continue gtt at current rate with plan to hold for return to IR and f/u after.   Julia GamblesVeronda Jeremaine White, PharmD, BCPS  05/28/2014,7:01 AM

## 2014-05-28 NOTE — Progress Notes (Signed)
RT called to room to adjust patients mask. Patient stated she felt like she couldn't breathe with the mask on. RT informed RN and patient was switched back to 100% NRB. Will continue to monitor.

## 2014-05-28 NOTE — Progress Notes (Signed)
ANTICOAGULATION CONSULT NOTE - Follow Up Consult  Pharmacy Consult for Heparin Indication: pulmonary embolus  No Known Allergies  Patient Measurements: Height: 5\' 7"  (170.2 cm) Weight: (!) 308 lb 10.3 oz (140 kg) IBW/kg (Calculated) : 61.6 Heparin Dosing Weight: 95.9  Vital Signs: Temp: 97.8 F (36.6 C) (05/25 1204) Temp Source: Oral (05/25 1204) BP: 111/57 mmHg (05/25 1100) Pulse Rate: 32 (05/25 1100)  Labs:  Recent Labs  05/27/14 1139 05/27/14 1744 05/27/14 1745  05/27/14 2230 05/28/14 0350 05/28/14 0623 05/28/14 0824 05/28/14 0958 05/28/14 1145  HGB 11.2*  --   --   --  11.5* 11.2*  11.3*  --  8.4* 10.9*  --   HCT 34.9*  --   --   --  36.7 34.6*  34.7*  --  26.5* 32.0*  --   PLT 190  --   --   --  129* 123*  123*  --  85*  --   --   LABPROT  --  18.3*  --   --   --   --   --   --   --   --   INR  --  1.52*  --   --   --   --   --   --   --   --   HEPARINUNFRC  --   --   --   < > 0.65 >2.20* 0.65  --   --  >2.20*  CREATININE 1.28*  --   --   --   --  2.07*  --   --  2.30*  --   TROPONINI  --   --  0.06*  --  0.14* 0.37*  --   --   --   --   < > = values in this interval not displayed.  Estimated Creatinine Clearance: 36.3 mL/min (by C-G formula based on Cr of 2.3).   Medications:  Infusions:  . sodium chloride 10 mL/hr at 05/28/14 0948  . sodium chloride 35 mL/hr at 05/27/14 2030  . sodium chloride 35 mL/hr at 05/27/14 2030  . sodium chloride 15 mL/hr at 05/28/14 0627  . sodium chloride 15 mL/hr at 05/28/14 [id number]  . heparin 1,500 Units/hr (05/28/14 0949)    Assessment: 64 year old female with extensive PE on EKOS protocol (receiving extended alteplase via catheter-directed lysis for total of 24 hours) and IV heparin per pharmacy dosing.   Repeat heparin level is supra-therapeutic at >2.2 on 1500 units/hr. Heparin level was drawn via peripheral line above site of heparin infusion therefore likely incorrect lab. RN did state that heparin was paused for  draw but likely remains in arm. Discussed with Dr. Tyson AliasFeinstein as patient is no peripheral stick via EKOS protocol - He gave verbal order for peripheral one time stick via foot for heparin level only. No bleeding per RN.   STAT repeat level is 0.59 - within normal limits on 1500 units/hr (drawn via foot stick).   Platelets are dropping: 190 >> 129 >> 123 >> 85 >> repeat 94. Monitoring q6h currently. Discussed with Dr. Tyson AliasFeinstein - feels this is consumption due to major clot burden will continue to follow. No change to DTI at this time.   Goal of Therapy:  Heparin level 0.3-0.7 units/ml Monitor platelets by anticoagulation protocol: Yes   Plan:  Continue Heparin at 1500 units/hr. Daily Heparin level and CBC.  F/up Platelets closely and determine if need to switch to DTI Monitor for signs and symptoms of  bleeding.  F/up plans for IR in AM 5/26 and restart of heparin versus oral therapy.  Link Snuffer, PharmD, BCPS Clinical Pharmacist [phone] 05/28/2014,1:37 PM

## 2014-05-29 ENCOUNTER — Inpatient Hospital Stay (HOSPITAL_COMMUNITY): Payer: Medicare Other

## 2014-05-29 DIAGNOSIS — I2699 Other pulmonary embolism without acute cor pulmonale: Secondary | ICD-10-CM

## 2014-05-29 LAB — BASIC METABOLIC PANEL
ANION GAP: 11 (ref 5–15)
Anion gap: 10 (ref 5–15)
Anion gap: 10 (ref 5–15)
Anion gap: 14 (ref 5–15)
BUN: 44 mg/dL — ABNORMAL HIGH (ref 6–20)
BUN: 43 mg/dL — ABNORMAL HIGH (ref 6–20)
BUN: 44 mg/dL — ABNORMAL HIGH (ref 6–20)
BUN: 37 mg/dL — AB (ref 6–20)
CHLORIDE: 108 mmol/L (ref 101–111)
CHLORIDE: 107 mmol/L (ref 101–111)
CO2: 17 mmol/L — AB (ref 22–32)
CO2: 17 mmol/L — ABNORMAL LOW (ref 22–32)
CO2: 17 mmol/L — ABNORMAL LOW (ref 22–32)
CO2: 15 mmol/L — ABNORMAL LOW (ref 22–32)
CREATININE: 1.9 mg/dL — AB (ref 0.44–1.00)
CREATININE: 1.42 mg/dL — AB (ref 0.44–1.00)
Calcium: 8.5 mg/dL — ABNORMAL LOW (ref 8.9–10.3)
Calcium: 8.5 mg/dL — ABNORMAL LOW (ref 8.9–10.3)
Calcium: 8.4 mg/dL — ABNORMAL LOW (ref 8.9–10.3)
Calcium: 8.8 mg/dL — ABNORMAL LOW (ref 8.9–10.3)
Chloride: 108 mmol/L (ref 101–111)
Chloride: 105 mmol/L (ref 101–111)
Creatinine, Ser: 2.03 mg/dL — ABNORMAL HIGH (ref 0.44–1.00)
Creatinine, Ser: 1.96 mg/dL — ABNORMAL HIGH (ref 0.44–1.00)
GFR calc Af Amer: 29 mL/min — ABNORMAL LOW (ref >60)
GFR calc Af Amer: 30 mL/min — ABNORMAL LOW (ref >60)
GFR calc non Af Amer: 26 mL/min — ABNORMAL LOW (ref >60)
GFR calc non Af Amer: 27 mL/min — ABNORMAL LOW (ref >60)
GFR calc non Af Amer: 38 mL/min — ABNORMAL LOW (ref >60)
GFR, EST AFRICAN AMERICAN: 31 mL/min — AB (ref >60)
GFR, EST AFRICAN AMERICAN: 44 mL/min — AB (ref >60)
GFR, EST NON AFRICAN AMERICAN: 25 mL/min — AB (ref >60)
GLUCOSE: 78 mg/dL (ref 65–99)
GLUCOSE: 143 mg/dL — AB (ref 65–99)
Glucose, Bld: 83 mg/dL (ref 65–99)
Glucose, Bld: 84 mg/dL (ref 65–99)
Potassium: 6.4 mmol/L (ref 3.5–5.1)
Potassium: 5.2 mmol/L — ABNORMAL HIGH (ref 3.5–5.1)
Potassium: 5 mmol/L (ref 3.5–5.1)
Potassium: 5.5 mmol/L — ABNORMAL HIGH (ref 3.5–5.1)
SODIUM: 134 mmol/L — AB (ref 135–145)
Sodium: 135 mmol/L (ref 135–145)
Sodium: 135 mmol/L (ref 135–145)
Sodium: 135 mmol/L (ref 135–145)

## 2014-05-29 LAB — FACTOR 5 LEIDEN

## 2014-05-29 LAB — CBC
HCT: 33.3 % — ABNORMAL LOW (ref 36.0–46.0)
HCT: 31.6 % — ABNORMAL LOW (ref 36.0–46.0)
HEMOGLOBIN: 10.5 g/dL — AB (ref 12.0–15.0)
Hemoglobin: 9.8 g/dL — ABNORMAL LOW (ref 12.0–15.0)
MCH: 23.4 pg — ABNORMAL LOW (ref 26.0–34.0)
MCH: 23 pg — ABNORMAL LOW (ref 26.0–34.0)
MCHC: 31.5 g/dL (ref 30.0–36.0)
MCHC: 31 g/dL (ref 30.0–36.0)
MCV: 74.3 fL — AB (ref 78.0–100.0)
MCV: 74.2 fL — ABNORMAL LOW (ref 78.0–100.0)
Platelets: 82 10*3/uL — ABNORMAL LOW (ref 150–400)
Platelets: 95 10*3/uL — ABNORMAL LOW (ref 150–400)
RBC: 4.48 MIL/uL (ref 3.87–5.11)
RBC: 4.26 MIL/uL (ref 3.87–5.11)
RDW: 20.5 % — ABNORMAL HIGH (ref 11.5–15.5)
RDW: 20.4 % — ABNORMAL HIGH (ref 11.5–15.5)
WBC: 13.3 10*3/uL — ABNORMAL HIGH (ref 4.0–10.5)
WBC: 12.6 10*3/uL — ABNORMAL HIGH (ref 4.0–10.5)

## 2014-05-29 LAB — PROTEIN S, TOTAL: PROTEIN S AG TOTAL: 152 % — AB (ref 58–150)

## 2014-05-29 LAB — PROTEIN S ACTIVITY: Protein S Activity: 55 % — ABNORMAL LOW (ref 60–145)

## 2014-05-29 LAB — HEPARIN LEVEL (UNFRACTIONATED)
HEPARIN UNFRACTIONATED: 0.72 [IU]/mL — AB (ref 0.30–0.70)
Heparin Unfractionated: 0.83 IU/mL — ABNORMAL HIGH (ref 0.30–0.70)

## 2014-05-29 LAB — GLUCOSE, CAPILLARY
GLUCOSE-CAPILLARY: 178 mg/dL — AB (ref 65–99)
GLUCOSE-CAPILLARY: 93 mg/dL (ref 65–99)
Glucose-Capillary: 71 mg/dL (ref 65–99)
Glucose-Capillary: 139 mg/dL — ABNORMAL HIGH (ref 65–99)
Glucose-Capillary: 198 mg/dL — ABNORMAL HIGH (ref 65–99)
Glucose-Capillary: 151 mg/dL — ABNORMAL HIGH (ref 65–99)

## 2014-05-29 LAB — HOMOCYSTEINE: HOMOCYSTEINE-NORM: 9.9 umol/L (ref 0.0–15.0)

## 2014-05-29 LAB — PROTEIN C ACTIVITY: Protein C Activity: 123 % (ref 74–151)

## 2014-05-29 LAB — LUPUS ANTICOAGULANT PANEL
DRVVT: 43.9 s (ref 0.0–55.1)
PTT LA: 44.2 s (ref 0.0–50.0)

## 2014-05-29 NOTE — H&P (Addendum)
PULMONARY / CRITICAL CARE MEDICINE   Name: MALLIE GIAMBRA MRN: [id number] DOB: 02-15-1950    ADMISSION DATE:  05/27/2014 CONSULTATION DATE:  05/29/2014  REFERRING MD :  EDP  CHIEF COMPLAINT:  SOB  INITIAL PRESENTATION:  64 y.o. F brought to [facility] ED 5/24 with severe SOB.  Found to have PE with CT evidence of RHS.  PCCM called for admission.     STUDIES:  CTA chest 5/24 >>> acute PE with focal saddle embolus and extending bilaterally, RV/LV ratio of 1.12 Echo 5/24 >>> LE duplex 5/24 >>>  SIGNIFICANT EVENTS: 5/24 - admitted with extensive PE. IR consulted for EKOS 5/25 - bipap, hypoxia, extended lytics 12 mor ehours  SUBJECTIVE: off bipap, on [employer] o2, catheters remain in   VITAL SIGNS: Temp:  [97.3 F (36.3 C)-97.8 F (36.6 C)] 97.6 F (36.4 C) (05/26 0424) Pulse Rate:  [32-104] 54 (05/26 0500) Resp:  [0-31] 16 (05/26 0500) BP: (73-158)/(23-107) 144/75 mmHg (05/26 0500) SpO2:  [94 %-100 %] 100 % (05/26 0500) FiO2 (%):  [40 %-100 %] 100 % (05/25 1800) Weight:  [149.4 kg (329 lb 5.9 oz)] 149.4 kg (329 lb 5.9 oz) (05/26 0500) HEMODYNAMICS:   VENTILATOR SETTINGS: Vent Mode:  [-] BIPAP FiO2 (%):  [40 %-100 %] 100 % Set Rate:  [10 bmp] 10 bmp PEEP:  [5 cmH20] 5 cmH20 INTAKE / OUTPUT: Intake/Output      05/25 0701 - 05/26 0700   I.V. (mL/kg) 3407.6 (22.8)   Total Intake(mL/kg) 3407.6 (22.8)   Urine (mL/kg/hr) 405 (0.1)   Total Output 405   Net +3002.6         PHYSICAL EXAMINATION: General: Morbidly obese female, no distress off bipap this am  Neuro: A&O x 3, non-focal.  HEENT: obese, PERRL Cardiovascular: s1 s2 RRR Lungs: reduced, slight coarse Abdomen: Obese, BS active low, soft, NT/ND.  Musculoskeletal: No gross deformities, no edema.  Skin: Intact, warm, no rashes.  LABS:  CBC  Recent Labs Lab 05/28/14 1302 05/29/14 0135 05/29/14 0430  WBC 14.0* 13.3* 12.6*  HGB 10.2* 10.5* 9.8*  HCT 32.0* 33.3* 31.6*  PLT 94* 82* 95*   Coag's  Recent Labs Lab  05/27/14 1744  INR 1.52*   BMET  Recent Labs Lab 05/29/14 0120 05/29/14 0230 05/29/14 0430  NA 135 135 135  K 6.4* 5.2* 5.0  CL 108 108 107  CO2 17* 17* 17*  BUN 44* 43* 44*  CREATININE 2.03* 1.96* 1.90*  GLUCOSE 83 84 78   Electrolytes  Recent Labs Lab 05/28/14 0350 05/29/14 0120 05/29/14 0230 05/29/14 0430  CALCIUM 8.3* 8.5* 8.5* 8.4*  MG 2.0  --   --   --   PHOS 5.4*  --   --   --    Sepsis Markers No results for input(s): LATICACIDVEN, PROCALCITON, O2SATVEN in the last 168 hours. ABG  Recent Labs Lab 05/28/14 0950  PHART 7.328*  PCO2ART 32.4*  PO2ART 156.0*   Liver Enzymes No results for input(s): AST, ALT, ALKPHOS, BILITOT, ALBUMIN in the last 168 hours. Cardiac Enzymes  Recent Labs Lab 05/27/14 1745 05/27/14 2230 05/28/14 0350  TROPONINI 0.06* 0.14* 0.37*   Glucose  Recent Labs Lab 05/28/14 0823 05/28/14 1203 05/28/14 1604 05/28/14 2000 05/28/14 2349 05/29/14 0425  GLUCAP 117* 97 81 85 93 71    Imaging Dg Chest Port 1 View  05/28/2014   CLINICAL DATA:  Pulmonary embolism.  EXAM: PORTABLE CHEST - 1 VIEW  COMPARISON:  05/27/2014  FINDINGS: Exam demonstrates small  caliber catheter is within the pulmonary arteries bilaterally. Lungs are hypoinflated with minimal hazy opacification in the left base/retrocardiac region slightly improved. Right lung is clear. Mild stable cardiomegaly. Remainder of the exam is unchanged.  IMPRESSION: Hypoinflation with interval improvement in mild hazy left base/ retrocardiac opacification.  Small caliber bilateral pulmonary arterial catheters.   Electronically Signed   By: Elberta Fortisaniel  Boyle M.D.   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 Hypercoagulable panel sent pending CBC in AM No role transfusion plat  CARDIOVASCULAR A:  Obstructive shock Hx HTN Highly likely to have pulm htn , protective to some extent (osa?) P:  Goal MAP > 60 TTE awaited, i see was done, i cant see report, want to assess pa pressures Hold outpatient amlodipine, HCTZ, lisinopril, amlodipine-olmesartan.  RENAL A:   CKD III, Acute on chronic renal failure, atn P:   NS reduce , was 5 liters pos  May need lasix BMP in AM pcxr  GASTROINTESTINAL A:   Morbid obesity Nutrition P:   ensure on ppi Add clears, advance fulls  INFECTIOUS A:   Leukocytosis - no indication for infection, likely represents an acute phase reactant P:   Monitor clinically.  ENDOCRINE A:   DM P:   CBG's q4hr until on diet SSI.  NEUROLOGIC A:   Anxiety controlled P:   Continue outpatient bupropion. Low dose xanax PRN.  Family updated: None.  Interdisciplinary Family Meeting v Palliative Care Meeting:  Due by: 5/30.  Ccm time  [address]30  Min   Mcarthur RossettiDaniel J. Tyson AliasFeinstein, MD, FACP Pgr: [phone] [place] [facility]

## 2014-05-29 NOTE — Sedation Documentation (Signed)
Patient awake, alert, no complaints of pain or discomfort. Will continue to monitor.

## 2014-05-29 NOTE — Progress Notes (Signed)
*  PRELIMINARY RESULTS* Vascular Ultrasound Lower extremity venous duplex has been completed.  Preliminary findings: DVT noted in Left popliteal vein. No DVT RLE.   Farrel DemarkJill Eunice, RDMS, RVT  05/29/2014, 10:27 AM

## 2014-05-29 NOTE — Sedation Documentation (Signed)
Lysis follow up. No sedation.

## 2014-05-29 NOTE — Sedation Documentation (Signed)
56/22 mean of 34. Main Pulmonary Artery.

## 2014-05-29 NOTE — Procedures (Signed)
Interventional Radiology Procedure Note  Procedure: Post PE lysis check.  Main PA pressure down to 56/22 mean 34 mmHg.  Sheaths pulled.   Complications: None  Estimated Blood Loss: 0  Recommendations: - Bedrest x 2 hrs with leg straight  Signed,  Sterling BigHeath K. Demareon Coldwell, MD

## 2014-05-29 NOTE — Progress Notes (Signed)
ANTICOAGULATION CONSULT NOTE - Follow Up Consult  Pharmacy Consult for Heparin Indication: pulmonary embolus  No Known Allergies  Patient Measurements: Height: 5\' 7"  (170.2 cm) Weight: (!) 329 lb 5.9 oz (149.4 kg) IBW/kg (Calculated) : 61.6 Heparin Dosing Weight: 95.9  Vital Signs: Temp: 98.5 F (36.9 C) (05/26 1131) Temp Source: Oral (05/26 1131) BP: 144/129 mmHg (05/26 1000) Pulse Rate: 101 (05/26 1000)  Labs:  Recent Labs  05/27/14 1744 05/27/14 1745  05/27/14 2230 05/28/14 0350  05/28/14 1302 05/28/14 1502 05/29/14 0120 05/29/14 0135 05/29/14 0230 05/29/14 0430 05/29/14 1214  HGB  --   --   --  11.5* 11.2*  11.3*  < > 10.2*  --   --  10.5*  --  9.8*  --   HCT  --   --   --  36.7 34.6*  34.7*  < > 32.0*  --   --  33.3*  --  31.6*  --   PLT  --   --   --  129* 123*  123*  < > 94*  --   --  82*  --  95*  --   LABPROT 18.3*  --   --   --   --   --   --   --   --   --   --   --   --   INR 1.52*  --   --   --   --   --   --   --   --   --   --   --   --   HEPARINUNFRC  --   --   < > 0.65 >2.20*  < >  --  0.59  --   --   --  0.83* 0.72*  CREATININE  --   --   --   --  2.07*  < >  --   --  2.03*  --  1.96* 1.90*  --   TROPONINI  --  0.06*  --  0.14* 0.37*  --   --   --   --   --   --   --   --   < > = values in this interval not displayed.  Estimated Creatinine Clearance: 45.7 mL/min (by C-G formula based on Cr of 1.9).   Medications:  Infusions:  . sodium chloride 10 mL/hr at 05/28/14 0948  . sodium chloride 35 mL/hr at 05/27/14 2030  . sodium chloride 35 mL/hr at 05/27/14 2030  . sodium chloride 15 mL/hr at 05/28/14 0627  . sodium chloride 15 mL/hr at 05/28/14 [id number]  . heparin 1,400 Units/hr (05/29/14 0537)    Assessment: 64 year old female with extensive PE s/p EKOS protocol (removed catheters this AM). Heparin was not stopped and remains running at 1500 units/hr.   Repeat heparin level is slightly supra-therapeutic at 0.72 on 1400 units/hr and drawn via  peripheral stick. No bleeding reported.     Platelets are down in 90s but remaining stable with no further drops. Discussed with Dr. Tyson AliasFeinstein - feels this is consumption due to major clot burden will continue to follow. No change to DTI at this time.   Goal of Therapy:  Heparin level 0.3-0.7 units/ml Monitor platelets by anticoagulation protocol: Yes   Plan:  Decrease Heparin at 1350 units/hr. Daily Heparin level and CBC.  F/up Platelets closely and determine if need to switch to DTI Monitor for signs and symptoms of bleeding.   Link SnufferJessica Tab Rylee, PharmD,  BCPS Clinical Pharmacist [phone] 05/29/2014,12:45 PM

## 2014-05-29 NOTE — Progress Notes (Signed)
RT set up CPAP for patient with nasal mask. Auto settings 20 max, 4 min with O2 bled in. Patient is going to attempt to wear considering this is her first time. RT will continue to monitor.

## 2014-05-29 NOTE — Progress Notes (Signed)
ANTICOAGULATION CONSULT NOTE - Follow Up Consult  Pharmacy Consult for heparin Indication: pulmonary embolus   Labs:  Recent Labs  05/27/14 1744 05/27/14 1745  05/27/14 2230 05/28/14 0350  05/28/14 1145 05/28/14 1302 05/28/14 1502 05/29/14 0120 05/29/14 0135 05/29/14 0230 05/29/14 0430  HGB  --   --   --  11.5* 11.2*  11.3*  < >  --  10.2*  --   --  10.5*  --  9.8*  HCT  --   --   --  36.7 34.6*  34.7*  < >  --  32.0*  --   --  33.3*  --  31.6*  PLT  --   --   --  129* 123*  123*  < >  --  94*  --   --  82*  --  95*  LABPROT 18.3*  --   --   --   --   --   --   --   --   --   --   --   --   INR 1.52*  --   --   --   --   --   --   --   --   --   --   --   --   HEPARINUNFRC  --   --   < > 0.65 >2.20*  < > >2.20*  --  0.59  --   --   --  0.83*  CREATININE  --   --   --   --  2.07*  < >  --   --   --  2.03*  --  1.96* 1.90*  TROPONINI  --  0.06*  --  0.14* 0.37*  --   --   --   --   --   --   --   --   < > = values in this interval not displayed.   Assessment: 64yo female now supratherapeutic on heparin after two levels at goal; of note must draw heparin level from same line as heparin running though gtt was paused and flushed prior to drawing; tPA infusion ended last pm, plan to go back to IR today.  Goal of Therapy:  Heparin level 0.3-0.7 units/ml   Plan:  Will decrease heparin gtt slightly to 1400 units/hr and check level in ~6hr vs f/u after IR.  Vernard GamblesVeronda Laquan Beier, PharmD, BCPS  05/29/2014,5:35 AM

## 2014-05-30 ENCOUNTER — Inpatient Hospital Stay (HOSPITAL_COMMUNITY): Payer: Medicare Other

## 2014-05-30 DIAGNOSIS — I2699 Other pulmonary embolism without acute cor pulmonale: Secondary | ICD-10-CM

## 2014-05-30 DIAGNOSIS — I10 Essential (primary) hypertension: Secondary | ICD-10-CM

## 2014-05-30 LAB — BASIC METABOLIC PANEL
ANION GAP: 9 (ref 5–15)
BUN: 22 mg/dL — ABNORMAL HIGH (ref 6–20)
CALCIUM: 8.7 mg/dL — AB (ref 8.9–10.3)
CO2: 20 mmol/L — ABNORMAL LOW (ref 22–32)
CREATININE: 1.09 mg/dL — AB (ref 0.44–1.00)
Chloride: 106 mmol/L (ref 101–111)
GFR calc Af Amer: >60 mL/min (ref >60)
GFR calc non Af Amer: 52 mL/min — ABNORMAL LOW (ref >60)
Glucose, Bld: 181 mg/dL — ABNORMAL HIGH (ref 65–99)
Potassium: 5 mmol/L (ref 3.5–5.1)
Sodium: 135 mmol/L (ref 135–145)

## 2014-05-30 LAB — COMPREHENSIVE METABOLIC PANEL
ALBUMIN: 3.1 g/dL — AB (ref 3.5–5.0)
ALT: 621 U/L — ABNORMAL HIGH (ref 14–54)
AST: 326 U/L — AB (ref 15–41)
Alkaline Phosphatase: 312 U/L — ABNORMAL HIGH (ref 38–126)
Anion gap: 12 (ref 5–15)
BUN: 31 mg/dL — AB (ref 6–20)
CHLORIDE: 105 mmol/L (ref 101–111)
CO2: 19 mmol/L — AB (ref 22–32)
CREATININE: 1.25 mg/dL — AB (ref 0.44–1.00)
Calcium: 9 mg/dL (ref 8.9–10.3)
GFR calc Af Amer: 52 mL/min — ABNORMAL LOW (ref >60)
GFR calc non Af Amer: 44 mL/min — ABNORMAL LOW (ref >60)
Glucose, Bld: 109 mg/dL — ABNORMAL HIGH (ref 65–99)
Potassium: 5 mmol/L (ref 3.5–5.1)
Sodium: 136 mmol/L (ref 135–145)
TOTAL PROTEIN: 6.9 g/dL (ref 6.5–8.1)
Total Bilirubin: 0.7 mg/dL (ref 0.3–1.2)

## 2014-05-30 LAB — CBC WITH DIFFERENTIAL/PLATELET
Basophils Absolute: 0 10*3/uL (ref 0.0–0.1)
Basophils Relative: 0 % (ref 0–1)
EOS ABS: 0 10*3/uL (ref 0.0–0.7)
EOS PCT: 0 % (ref 0–5)
HEMATOCRIT: 33 % — AB (ref 36.0–46.0)
Hemoglobin: 10.5 g/dL — ABNORMAL LOW (ref 12.0–15.0)
LYMPHS ABS: 1.7 10*3/uL (ref 0.7–4.0)
Lymphocytes Relative: 14 % (ref 12–46)
MCH: 23.8 pg — ABNORMAL LOW (ref 26.0–34.0)
MCHC: 31.8 g/dL (ref 30.0–36.0)
MCV: 74.7 fL — ABNORMAL LOW (ref 78.0–100.0)
MONOS PCT: 7 % (ref 3–12)
Monocytes Absolute: 0.8 10*3/uL (ref 0.1–1.0)
NEUTROS ABS: 10.1 10*3/uL — AB (ref 1.7–7.7)
Neutrophils Relative %: 79 % — ABNORMAL HIGH (ref 43–77)
Platelets: 111 10*3/uL — ABNORMAL LOW (ref 150–400)
RBC: 4.42 MIL/uL (ref 3.87–5.11)
RDW: 20.6 % — ABNORMAL HIGH (ref 11.5–15.5)
WBC: 12.7 10*3/uL — ABNORMAL HIGH (ref 4.0–10.5)

## 2014-05-30 LAB — CARDIOLIPIN ANTIBODIES, IGG, IGM, IGA: Anticardiolipin IgG: <9 GPL U/mL (ref 0–14)

## 2014-05-30 LAB — GLUCOSE, CAPILLARY
GLUCOSE-CAPILLARY: 115 mg/dL — AB (ref 65–99)
GLUCOSE-CAPILLARY: 166 mg/dL — AB (ref 65–99)
GLUCOSE-CAPILLARY: 170 mg/dL — AB (ref 65–99)
GLUCOSE-CAPILLARY: 187 mg/dL — AB (ref 65–99)
Glucose-Capillary: 153 mg/dL — ABNORMAL HIGH (ref 65–99)

## 2014-05-30 LAB — HEPARIN LEVEL (UNFRACTIONATED)
HEPARIN UNFRACTIONATED: 0.16 [IU]/mL — AB (ref 0.30–0.70)
HEPARIN UNFRACTIONATED: 0.22 [IU]/mL — AB (ref 0.30–0.70)
Heparin Unfractionated: 0.4 IU/mL (ref 0.30–0.70)

## 2014-05-30 LAB — BETA-2-GLYCOPROTEIN I ABS, IGG/M/A
Beta-2 Glyco I IgG: <9 GPI IgG units (ref 0–20)
Beta-2-Glycoprotein I IgM: <9 GPI IgM units (ref 0–32)

## 2014-05-30 LAB — PROTHROMBIN GENE MUTATION

## 2014-05-30 MED ORDER — METOPROLOL TARTRATE 12.5 MG HALF TABLET
12.5000 mg | ORAL_TABLET | Freq: Two times a day (BID) | ORAL | Status: DC
  Administered 2014-05-31: 12.5 mg via ORAL
  Filled 2014-05-30 (×4): qty 1

## 2014-05-30 MED ORDER — PERFLUTREN LIPID MICROSPHERE
1.0000 mL | INTRAVENOUS | Status: AC | PRN
  Administered 2014-05-30: 4 mL via INTRAVENOUS
  Filled 2014-05-30: qty 10

## 2014-05-30 MED ORDER — INSULIN DETEMIR 100 UNIT/ML ~~LOC~~ SOLN
5.0000 [IU] | Freq: Every day | SUBCUTANEOUS | Status: DC
  Administered 2014-06-02: 5 [IU] via SUBCUTANEOUS
  Filled 2014-05-30 (×4): qty 0.05

## 2014-05-30 MED ORDER — PANTOPRAZOLE SODIUM 40 MG PO TBEC
40.0000 mg | DELAYED_RELEASE_TABLET | Freq: Every day | ORAL | Status: DC
  Administered 2014-05-31 – 2014-06-02 (×3): 40 mg via ORAL
  Filled 2014-05-30 (×2): qty 1

## 2014-05-30 NOTE — Progress Notes (Signed)
PCCM  Subjective: Still short of breath, but better than before.  Didn't like CPAP.  Feels that nebs help.  Objective: Temp:  [97.6 F (36.4 C)-98.1 F (36.7 C)] 98.1 F (36.7 C) (05/27 0435) Pulse Rate:  [49-111] 101 (05/27 0435) Resp:  [22] 22 (05/27 0435) BP: (125-156)/(84-109) 141/98 mmHg (05/27 0435) SpO2:  [97 %-100 %] 99 % (05/27 0435) Weight:  [305 lb (138.347 kg)] 305 lb (138.347 kg) (05/27 0437)  General: sitting up in bed HEENT: no sinus tenderness Cardiac: regular Chest: no wheeze Abdomen: obese Ext: no edema Neuro: follows commands Skin: no rash  CMP Latest Ref Rng 05/30/2014 05/29/2014 05/29/2014  Glucose 65 - 99 mg/dL 409(W) 119(J) 78  BUN 6 - 20 mg/dL 47(W) 29(F) 62(Z)  Creatinine 0.44 - 1.00 mg/dL 3.08(M) 5.78(I) 6.96(E)  Sodium 135 - 145 mmol/L 136 134(L) 135  Potassium 3.5 - 5.1 mmol/L 5.0 5.5(H) 5.0  Chloride 101 - 111 mmol/L 105 105 107  CO2 22 - 32 mmol/L 19(L) 15(L) 17(L)  Calcium 8.9 - 10.3 mg/dL 9.0 9.5(M) 8.4(X)  Total Protein 6.5 - 8.1 g/dL 6.9 - -  Total Bilirubin 0.3 - 1.2 mg/dL 0.7 - -  Alkaline Phos 38 - 126 U/L 312(H) - -  AST 15 - 41 U/L 326(H) - -  ALT 14 - 54 U/L 621(H) - -    CBC Latest Ref Rng 05/30/2014 05/29/2014 05/29/2014  WBC 4.0 - 10.5 K/uL 12.7(H) 12.6(H) 13.3(H)  Hemoglobin 12.0 - 15.0 g/dL 10.5(L) 9.8(L) 10.5(L)  Hematocrit 36.0 - 46.0 % 33.0(L) 31.6(L) 33.3(L)  Platelets 150 - 400 K/uL 111(L) 95(L) 82(L)    Dg Chest Port 1 View  05/29/2014   CLINICAL DATA:  Bilateral pulmonary emboli  EXAM: PORTABLE CHEST - 1 VIEW  COMPARISON:  05/28/2014  FINDINGS: Cardiac shadow remains enlarged. Bilateral EKOS catheters are again noted. And stable in appearance. Aortic calcifications are seen. The lungs are clear bilaterally.  IMPRESSION: Bilateral thrombolytic catheters.  No acute abnormality noted.   Electronically Signed   By: Alcide Clever M.D.   On: 05/29/2014 08:00   Ir Rande Lawman F/u Eval Art/ven Final Day (ms)  05/29/2014   CLINICAL  DATA:  64 year old female with sub massive a bordering on massive PE. Bilateral pulmonary artery ultrasound accelerated thrombolysis was initiated on 05/24 2016. Due to a suboptimal clinical result after initial 12 hours and 24 mg tPA, lysis was repeated for an additional 12 hours and 24 mg tPA. Patient now presents after significant clinical improvement for repeat pressure measurements and catheter removal.  EXAM: IR THROMB F/U EVAL ART/VEN FINAL DAY  Date: 05/29/2014  PROCEDURE: 1. Catheter repositioning under fluoroscopy 2. Pulmonary arterial pressure measurement 3. Removal of slices catheters and vascular sheaths Interventional Radiologist:  Sterling Big, MD  ANESTHESIA/SEDATION: None required  MEDICATIONS: None required  FLUOROSCOPY TIME:  54 seconds  33.8 mGy  CONTRAST:  None  TECHNIQUE: Informed consent was obtained from the patient following explanation of the procedure, risks, benefits and alternatives. The patient understands, agrees and consents for the procedure. All questions were addressed. A time out was performed.  Under fluoroscopic guidance, the right pulmonary artery lysis catheter was brought back so that the end hole was within the main pulmonary artery. The pressure transducer was then connected to the catheter which was flushed, aspirated and 0 to. Main pulmonary arterial pressure stents were then obtained. The main pulmonary arterial pressure is 56/24 for a mean of 34 mm Hg.  The catheter was removed. Similarly,  the left pulmonary artery catheter was brought back into the main pulmonary artery in the pressure repeated with similar measurements. This catheter was removed. The vascular sheaths were then pulled and hemostasis attained by manual pressure.  COMPLICATIONS: None  IMPRESSION: 1. Significant improvement in main pulmonary arterial pressure to 56/24 (mean 34) mmHg following a total of 24 hours of a bilateral pulmonary arterial lie cyst using a total of 48 mg tPA. This occurred  over a 36 hour period.  Signed,  Sterling BigHeath K. McCullough, MD  [facility]  [facility]   Electronically Signed   By: Malachy MoanHeath  McCullough M.D.   On: 05/29/2014 12:37   Studies: CTA chest 5/24 >> acute PE with focal saddle embolus and extending bilaterally, RV/LV ratio of 1.12 Doppler legs 5/26 >> DVT Lt popliteal vein Echo 5/27 >> EF 35 to 40%, PAS 70 mmHg  Assessment/Plan:  PE/DVT s/p EKOS 5/24. Plan: - continue heparin gtt - transition to oral anticoagulant >> pt finances might be an issue  Hx of smoking with ?COPD. Plan: - continue BD's  Systolic CHF. Plan: - per primary team  Presumed sleep disordered breathing >> did not tolerate trial of CPAP in hospital. Plan: - re-assess as outpt  PCCM will sign off.  Please call if additional help needed while she is in hospital.   Coralyn HellingVineet Viola Placeres, MD [facility] 05/30/2014, 11:46 AM Pager:  [phone] After 3pm call: [phone]

## 2014-05-30 NOTE — Progress Notes (Signed)
Patient ID: [name], female   DOB: 1950/07/06, 64 y.o.   MRN: [id number]      Referring Physician(s): Dr. Molli Knock  Subjective:  Patient states she is breathing much better today.  She states she "still gets a litte winded" when she tries to walk or move around a lot in the bed, but overall she feels much better.  Allergies: Review of patient's allergies indicates no known allergies.  Medications: Prior to Admission medications   Medication Sig Start Date End Date Taking? Authorizing Provider  albuterol (PROVENTIL HFA;VENTOLIN HFA) 108 (90 BASE) MCG/ACT inhaler Inhale 1-2 puffs into the lungs every 6 (six) hours as needed for wheezing. 01/22/12  Yes Nelva Nay, MD  ALPRAZolam Prudy Feeler) 0.25 MG tablet Take 0.25 mg by mouth 2 (two) times daily as needed for anxiety.   Yes Historical Provider, MD  amLODipine (NORVASC) 10 MG tablet Take 10 mg by mouth daily.   Yes Historical Provider, MD  aspirin EC 81 MG EC tablet Take 1 tablet (81 mg total) by mouth daily. 07/10/12  Yes Shanker Levora Dredge, MD  BIOTIN PO Take 500 mcg by mouth daily.   Yes Historical Provider, MD  buPROPion (WELLBUTRIN) 100 MG tablet Take 100 mg by mouth 2 (two) times daily.   Yes Historical Provider, MD  glimepiride (AMARYL) 4 MG tablet Take 4 mg by mouth daily with breakfast.   Yes Historical Provider, MD  hydrochlorothiazide (HYDRODIURIL) 25 MG tablet Take 25 mg by mouth daily.   Yes Historical Provider, MD  lisinopril (PRINIVIL,ZESTRIL) 40 MG tablet Take 40 mg by mouth daily.   Yes Historical Provider, MD  metFORMIN (GLUCOPHAGE) 500 MG tablet Take 1 tablet (500 mg total) by mouth 2 (two) times daily with a meal. 07/11/12  Yes Clydia Llano, MD  mometasone-formoterol (DULERA) 100-5 MCG/ACT AERO Inhale 2 puffs into the lungs 2 (two) times daily.   Yes Historical Provider, MD  Multiple Vitamins-Minerals (MULTIVITAMIN WITH MINERALS) tablet Take 1 tablet by mouth daily.   Yes Historical Provider, MD  pantoprazole (PROTONIX)  40 MG tablet Take 40 mg by mouth daily as needed.    Yes Historical Provider, MD  amLODipine-olmesartan (AZOR) 5-20 MG per tablet Take 1 tablet by mouth daily. Patient not taking: Reported on 05/27/2014 05/13/14   Nyoka Cowden, MD     Vital Signs: BP 141/98 mmHg  Pulse 101  Temp(Src) 98.1 F (36.7 C) (Oral)  Resp 22  Ht  (1.702 m)  Wt 305 lb (138.347 kg)  BMI 47.76 kg/m2  SpO2 99%  Physical Exam  Constitutional: She is oriented to person, place, and time.  Morbidly Obese  Cardiovascular: Normal rate and regular rhythm.   Pulmonary/Chest: Effort normal and breath sounds normal.  Moderate SOB with activity.   Lungs CTAB  Still on O2, 3 Liters Nasal cannula  Abdominal: Soft. There is no tenderness.  Musculoskeletal:  Right groin stick OK. No hematoma. No Pseudoaneurysm  Neurological: She is alert and oriented to person, place, and time.  Vitals reviewed.   Imaging: Ct Angio Chest Pe W/cm &/or Wo Cm  05/27/2014   CLINICAL DATA:  Shortness of breath for several days  EXAM: CT ANGIOGRAPHY CHEST WITH CONTRAST  TECHNIQUE: Multidetector CT imaging of the chest was performed using the standard protocol during bolus administration of intravenous contrast. Multiplanar CT image reconstructions and MIPs were obtained to evaluate the vascular anatomy.  CONTRAST:  OMNIPAQUE IOHEXOL 350 MG/ML SOLN  COMPARISON:  None.  FINDINGS: The lungs are  well aerated bilaterally. No focal infiltrate or sizable effusion is seen.  The thoracic inlet is within normal limits. Mild calcifications of the thoracic aorta are noted without aneurysmal dilatation. Pulmonary artery demonstrates significant pulmonary emboli as well as a focal saddle embolus traversing the bifurcation of the main pulmonary artery. Evidence of right heart strain is noted with dilatation of right ventricle.  No hilar or mediastinal adenopathy is noted. The visualized upper abdomen is within normal limits. No acute bony abnormality  is noted. Degenerative changes of thoracic spine are seen.  Review of the MIP images confirms the above findings.  IMPRESSION: Positive for acute PE with CT evidence of right heart strain (RV/LV Ratio = 1.12) consistent with at least submassive (intermediate risk) PE. The presence of right heart strain has been associated with an increased risk of morbidity and mortality. Please activate Code PE by paging [phone].  Critical Value/emergent results were called by telephone at the time of interpretation on 05/27/2014 at 3:16 pm to Dr. Purvis Sheffield , who verbally acknowledged these results.   Electronically Signed   By: Alcide Clever M.D.   On: 05/27/2014 15:19   Ir Angiogram Pulmonary Bilateral Selective  05/28/2014   CLINICAL DATA:  64 year old female with acute sub massive bordering on massive (borderline hypotension) large volume PE and CT evidence of right heart strain with significant respiratory distress and difficulty in maintaining oxygenation. Urgent ultrasound accelerated thrombolysis is warranted to facilitate unloading of the pulmonary arterial pressure and right heart strain.  EXAM: BILATERAL PULMONARY ARTERIOGRAPHY; ADDITIONAL ARTERIOGRAPHY; IR ULTRASOUND GUIDANCE VASC ACCESS RIGHT; IR INFUSION THROMBOL VENOUS INITIAL (MS)  Date: 05/28/2014  PROCEDURE: 1. Ultrasound guided right common femoral venous access 2. Second ultrasound-guided right common femoral venous access 3. Main pulmonary artery pressure measurement 4. Left pulmonary arteriogram 5. Right pulmonary arteriogram 6. EKOS catheter placement in the right lower lobe pulmonary artery 7. EKOS catheter placement in the left lower lobe pulmonary artery 8. Initiation of bilateral pulmonary arterial thrombolysis Interventional Radiologist:  Sterling Big, MD  ANESTHESIA/SEDATION: Moderate (conscious) sedation was used. 0.5 mg Versed, 25 mcg Fentanyl were administered intravenously. The patient's vital signs were monitored continuously by  radiology nursing throughout the procedure.  Sedation Time: 30 minutes  MEDICATIONS: TPA begun at 0.5 mg/hr in each catheter for a total of 1 mg/hr  FLUOROSCOPY TIME:  11 minutes 12 seconds  609 mGy  CONTRAST:  30mL OMNIPAQUE IOHEXOL 300 MG/ML  SOLN  TECHNIQUE: Informed consent was obtained from the patient following explanation of the procedure, risks, benefits and alternatives. The patient understands, agrees and consents for the procedure. All questions were addressed. A time out was performed.  Maximal barrier sterile technique utilized including caps, mask, sterile gowns, sterile gloves, large sterile drape, hand hygiene, and Betadine skin prep.  The right groin was interrogated with ultrasound. The common femoral vein is distended and compressible. There is no evidence of thrombus. An image was obtained and stored for the medical record. Local anesthesia was attained by infiltration with 1% lidocaine. Two small dermatotomy server made. Using ultrasound guidance, the vessel was punctured with a 21 gauge micropuncture needle. With the assistance of a micro wire, a 5 [place] transitional sheath was advanced into the vessel.  A second common femoral access was then obtained again using real-time sonographic guidance and a 21 gauge micropuncture needle and transitional 5 French micro sheath system. Both micro sheaths were then exchanged sequentially over 035 wires for working 6 [place] vascular sheaths.  A 5  [place] vert catheter was then navigated into the main pulmonary artery over a Bentson wire. Main pulmonary arterial pressures were obtained. The pulmonary arterial pressure was 74/39 for a mean of 50 mmHg. The 5 French catheter was next used to select the left main pulmonary artery. A limited left pulmonary arteriogram was performed. There is significant clot burden in the branches to the lower lobe. The catheter was navigated into the distal left lower lobe pulmonary artery. A Rosen wire was advanced into the  distal left lower lobe pulmonary artery in the 5 French catheter removed. The 5 French catheter was then inserted into the second 6 [place] vascular sheath an again navigated into the main pulmonary artery over a Bentson wire. This time, the catheter was used to select the right main pulmonary artery. A limited right pulmonary arteriogram was performed demonstrating occlusive clot in the right lower lobe pulmonary artery confirming the findings of the recent CT scan. There is very poor perfusion of the right lung.  The catheter was advanced over the Bentson wire into the distal right lower lobe pulmonary artery. The Bentson wire was exchanged for a Rosen wire in the 5 [place] catheter removed. An 18 cm EKOS infusion catheter was then advanced over the wire and positioned in the right lower lobe pulmonary artery. The proximal aspect of the catheter was allowed to come back into the main pulmonary trunk to facilitate dating of the saddle embolus.  A 12 cm EKOS infusion catheter was then advanced over the second wire and positioned in the left lower lobe pulmonary artery.  The sheaths were secured in place with sterile bandages. Pulmonary arterial lysis was then initiated per standard protocol.  COMPLICATIONS: None  IMPRESSION: 1. Main pulmonary arterial pressure was 74/39 for a mean of 50 mmHg consistent with critical pulmonary arterial hypertension. 2. Bilateral pulmonary arteriography demonstrates large volume occlusive clot in the central right lower lobe pulmonary artery, and high burden of subocclusive caught throughout the left lower extremity pulmonary artery. 3. Initiation of bilateral ultrasound accelerated thrombolysis with EKOS catheters per code PE protocol.  Signed,  Sterling BigHeath K. McCullough, MD  [facility]  [facility]   Electronically Signed   By: Malachy MoanHeath  McCullough M.D.   On: 05/28/2014 09:08   Ir Angiogram Selective Each Additional Vessel  05/28/2014    CLINICAL DATA:  64 year old female with acute sub massive bordering on massive (borderline hypotension) large volume PE and CT evidence of right heart strain with significant respiratory distress and difficulty in maintaining oxygenation. Urgent ultrasound accelerated thrombolysis is warranted to facilitate unloading of the pulmonary arterial pressure and right heart strain.  EXAM: BILATERAL PULMONARY ARTERIOGRAPHY; ADDITIONAL ARTERIOGRAPHY; IR ULTRASOUND GUIDANCE VASC ACCESS RIGHT; IR INFUSION THROMBOL VENOUS INITIAL (MS)  Date: 05/28/2014  PROCEDURE: 1. Ultrasound guided right common femoral venous access 2. Second ultrasound-guided right common femoral venous access 3. Main pulmonary artery pressure measurement 4. Left pulmonary arteriogram 5. Right pulmonary arteriogram 6. EKOS catheter placement in the right lower lobe pulmonary artery 7. EKOS catheter placement in the left lower lobe pulmonary artery 8. Initiation of bilateral pulmonary arterial thrombolysis Interventional Radiologist:  Sterling BigHeath K. McCullough, MD  ANESTHESIA/SEDATION: Moderate (conscious) sedation was used. 0.5 mg Versed, 25 mcg Fentanyl were administered intravenously. The patient's vital signs were monitored continuously by radiology nursing throughout the procedure.  Sedation Time: 30 minutes  MEDICATIONS: TPA begun at 0.5 mg/hr in each catheter for a total of 1 mg/hr  FLUOROSCOPY TIME:  11 minutes 12  seconds  609 mGy  CONTRAST:  30mL OMNIPAQUE IOHEXOL 300 MG/ML  SOLN  TECHNIQUE: Informed consent was obtained from the patient following explanation of the procedure, risks, benefits and alternatives. The patient understands, agrees and consents for the procedure. All questions were addressed. A time out was performed.  Maximal barrier sterile technique utilized including caps, mask, sterile gowns, sterile gloves, large sterile drape, hand hygiene, and Betadine skin prep.  The right groin was interrogated with ultrasound. The common femoral vein  is distended and compressible. There is no evidence of thrombus. An image was obtained and stored for the medical record. Local anesthesia was attained by infiltration with 1% lidocaine. Two small dermatotomy server made. Using ultrasound guidance, the vessel was punctured with a 21 gauge micropuncture needle. With the assistance of a micro wire, a 5 [place] transitional sheath was advanced into the vessel.  A second common femoral access was then obtained again using real-time sonographic guidance and a 21 gauge micropuncture needle and transitional 5 French micro sheath system. Both micro sheaths were then exchanged sequentially over 035 wires for working 6 [place] vascular sheaths.  A 5 French vert catheter was then navigated into the main pulmonary artery over a Bentson wire. Main pulmonary arterial pressures were obtained. The pulmonary arterial pressure was 74/39 for a mean of 50 mmHg. The 5 French catheter was next used to select the left main pulmonary artery. A limited left pulmonary arteriogram was performed. There is significant clot burden in the branches to the lower lobe. The catheter was navigated into the distal left lower lobe pulmonary artery. A Rosen wire was advanced into the distal left lower lobe pulmonary artery in the 5 French catheter removed. The 5 French catheter was then inserted into the second 6 [place] vascular sheath an again navigated into the main pulmonary artery over a Bentson wire. This time, the catheter was used to select the right main pulmonary artery. A limited right pulmonary arteriogram was performed demonstrating occlusive clot in the right lower lobe pulmonary artery confirming the findings of the recent CT scan. There is very poor perfusion of the right lung.  The catheter was advanced over the Bentson wire into the distal right lower lobe pulmonary artery. The Bentson wire was exchanged for a Rosen wire in the 5 [place] catheter removed. An 18 cm EKOS infusion catheter was  then advanced over the wire and positioned in the right lower lobe pulmonary artery. The proximal aspect of the catheter was allowed to come back into the main pulmonary trunk to facilitate dating of the saddle embolus.  A 12 cm EKOS infusion catheter was then advanced over the second wire and positioned in the left lower lobe pulmonary artery.  The sheaths were secured in place with sterile bandages. Pulmonary arterial lysis was then initiated per standard protocol.  COMPLICATIONS: None  IMPRESSION: 1. Main pulmonary arterial pressure was 74/39 for a mean of 50 mmHg consistent with critical pulmonary arterial hypertension. 2. Bilateral pulmonary arteriography demonstrates large volume occlusive clot in the central right lower lobe pulmonary artery, and high burden of subocclusive caught throughout the left lower extremity pulmonary artery. 3. Initiation of bilateral ultrasound accelerated thrombolysis with EKOS catheters per code PE protocol.  Signed,  Sterling Big, MD  [facility]  [facility]   Electronically Signed   By: Malachy Moan M.D.   On: 05/28/2014 09:08   Ir Angiogram Selective Each Additional Vessel  05/28/2014   CLINICAL DATA:  64 year old  female with acute sub massive bordering on massive (borderline hypotension) large volume PE and CT evidence of right heart strain with significant respiratory distress and difficulty in maintaining oxygenation. Urgent ultrasound accelerated thrombolysis is warranted to facilitate unloading of the pulmonary arterial pressure and right heart strain.  EXAM: BILATERAL PULMONARY ARTERIOGRAPHY; ADDITIONAL ARTERIOGRAPHY; IR ULTRASOUND GUIDANCE VASC ACCESS RIGHT; IR INFUSION THROMBOL VENOUS INITIAL (MS)  Date: 05/28/2014  PROCEDURE: 1. Ultrasound guided right common femoral venous access 2. Second ultrasound-guided right common femoral venous access 3. Main pulmonary artery pressure measurement 4. Left pulmonary  arteriogram 5. Right pulmonary arteriogram 6. EKOS catheter placement in the right lower lobe pulmonary artery 7. EKOS catheter placement in the left lower lobe pulmonary artery 8. Initiation of bilateral pulmonary arterial thrombolysis Interventional Radiologist:  Sterling Big, MD  ANESTHESIA/SEDATION: Moderate (conscious) sedation was used. 0.5 mg Versed, 25 mcg Fentanyl were administered intravenously. The patient's vital signs were monitored continuously by radiology nursing throughout the procedure.  Sedation Time: 30 minutes  MEDICATIONS: TPA begun at 0.5 mg/hr in each catheter for a total of 1 mg/hr  FLUOROSCOPY TIME:  11 minutes 12 seconds  609 mGy  CONTRAST:  30mL OMNIPAQUE IOHEXOL 300 MG/ML  SOLN  TECHNIQUE: Informed consent was obtained from the patient following explanation of the procedure, risks, benefits and alternatives. The patient understands, agrees and consents for the procedure. All questions were addressed. A time out was performed.  Maximal barrier sterile technique utilized including caps, mask, sterile gowns, sterile gloves, large sterile drape, hand hygiene, and Betadine skin prep.  The right groin was interrogated with ultrasound. The common femoral vein is distended and compressible. There is no evidence of thrombus. An image was obtained and stored for the medical record. Local anesthesia was attained by infiltration with 1% lidocaine. Two small dermatotomy server made. Using ultrasound guidance, the vessel was punctured with a 21 gauge micropuncture needle. With the assistance of a micro wire, a 5 [place] transitional sheath was advanced into the vessel.  A second common femoral access was then obtained again using real-time sonographic guidance and a 21 gauge micropuncture needle and transitional 5 French micro sheath system. Both micro sheaths were then exchanged sequentially over 035 wires for working 6 [place] vascular sheaths.  A 5 French vert catheter was then navigated into  the main pulmonary artery over a Bentson wire. Main pulmonary arterial pressures were obtained. The pulmonary arterial pressure was 74/39 for a mean of 50 mmHg. The 5 French catheter was next used to select the left main pulmonary artery. A limited left pulmonary arteriogram was performed. There is significant clot burden in the branches to the lower lobe. The catheter was navigated into the distal left lower lobe pulmonary artery. A Rosen wire was advanced into the distal left lower lobe pulmonary artery in the 5 French catheter removed. The 5 French catheter was then inserted into the second 6 [place] vascular sheath an again navigated into the main pulmonary artery over a Bentson wire. This time, the catheter was used to select the right main pulmonary artery. A limited right pulmonary arteriogram was performed demonstrating occlusive clot in the right lower lobe pulmonary artery confirming the findings of the recent CT scan. There is very poor perfusion of the right lung.  The catheter was advanced over the Bentson wire into the distal right lower lobe pulmonary artery. The Bentson wire was exchanged for a Rosen wire in the 5 [place] catheter removed. An 18 cm EKOS infusion catheter was then  advanced over the wire and positioned in the right lower lobe pulmonary artery. The proximal aspect of the catheter was allowed to come back into the main pulmonary trunk to facilitate dating of the saddle embolus.  A 12 cm EKOS infusion catheter was then advanced over the second wire and positioned in the left lower lobe pulmonary artery.  The sheaths were secured in place with sterile bandages. Pulmonary arterial lysis was then initiated per standard protocol.  COMPLICATIONS: None  IMPRESSION: 1. Main pulmonary arterial pressure was 74/39 for a mean of 50 mmHg consistent with critical pulmonary arterial hypertension. 2. Bilateral pulmonary arteriography demonstrates large volume occlusive clot in the central right lower lobe  pulmonary artery, and high burden of subocclusive caught throughout the left lower extremity pulmonary artery. 3. Initiation of bilateral ultrasound accelerated thrombolysis with EKOS catheters per code PE protocol.  Signed,  Sterling Big, MD  [facility]  [facility]   Electronically Signed   By: Malachy Moan M.D.   On: 05/28/2014 09:08   Ir US Guide Vasc Access Right  05/28/2014   CLINICAL DATA:  64 year old female with acute sub massive bordering on massive (borderline hypotension) large volume PE and CT evidence of right heart strain with significant respiratory distress and difficulty in maintaining oxygenation. Urgent ultrasound accelerated thrombolysis is warranted to facilitate unloading of the pulmonary arterial pressure and right heart strain.  EXAM: BILATERAL PULMONARY ARTERIOGRAPHY; ADDITIONAL ARTERIOGRAPHY; IR ULTRASOUND GUIDANCE VASC ACCESS RIGHT; IR INFUSION THROMBOL VENOUS INITIAL (MS)  Date: 05/28/2014  PROCEDURE: 1. Ultrasound guided right common femoral venous access 2. Second ultrasound-guided right common femoral venous access 3. Main pulmonary artery pressure measurement 4. Left pulmonary arteriogram 5. Right pulmonary arteriogram 6. EKOS catheter placement in the right lower lobe pulmonary artery 7. EKOS catheter placement in the left lower lobe pulmonary artery 8. Initiation of bilateral pulmonary arterial thrombolysis Interventional Radiologist:  Sterling Big, MD  ANESTHESIA/SEDATION: Moderate (conscious) sedation was used. 0.5 mg Versed, 25 mcg Fentanyl were administered intravenously. The patient's vital signs were monitored continuously by radiology nursing throughout the procedure.  Sedation Time: 30 minutes  MEDICATIONS: TPA begun at 0.5 mg/hr in each catheter for a total of 1 mg/hr  FLUOROSCOPY TIME:  11 minutes 12 seconds  609 mGy  CONTRAST:  30mL OMNIPAQUE IOHEXOL 300 MG/ML  SOLN  TECHNIQUE: Informed consent was  obtained from the patient following explanation of the procedure, risks, benefits and alternatives. The patient understands, agrees and consents for the procedure. All questions were addressed. A time out was performed.  Maximal barrier sterile technique utilized including caps, mask, sterile gowns, sterile gloves, large sterile drape, hand hygiene, and Betadine skin prep.  The right groin was interrogated with ultrasound. The common femoral vein is distended and compressible. There is no evidence of thrombus. An image was obtained and stored for the medical record. Local anesthesia was attained by infiltration with 1% lidocaine. Two small dermatotomy server made. Using ultrasound guidance, the vessel was punctured with a 21 gauge micropuncture needle. With the assistance of a micro wire, a 5 [place] transitional sheath was advanced into the vessel.  A second common femoral access was then obtained again using real-time sonographic guidance and a 21 gauge micropuncture needle and transitional 5 French micro sheath system. Both micro sheaths were then exchanged sequentially over 035 wires for working 6 [place] vascular sheaths.  A 5 French vert catheter was then navigated into the main pulmonary artery over a Bentson wire. Main  pulmonary arterial pressures were obtained. The pulmonary arterial pressure was 74/39 for a mean of 50 mmHg. The 5 French catheter was next used to select the left main pulmonary artery. A limited left pulmonary arteriogram was performed. There is significant clot burden in the branches to the lower lobe. The catheter was navigated into the distal left lower lobe pulmonary artery. A Rosen wire was advanced into the distal left lower lobe pulmonary artery in the 5 French catheter removed. The 5 French catheter was then inserted into the second 6 [place] vascular sheath an again navigated into the main pulmonary artery over a Bentson wire. This time, the catheter was used to select the right main  pulmonary artery. A limited right pulmonary arteriogram was performed demonstrating occlusive clot in the right lower lobe pulmonary artery confirming the findings of the recent CT scan. There is very poor perfusion of the right lung.  The catheter was advanced over the Bentson wire into the distal right lower lobe pulmonary artery. The Bentson wire was exchanged for a Rosen wire in the 5 [place] catheter removed. An 18 cm EKOS infusion catheter was then advanced over the wire and positioned in the right lower lobe pulmonary artery. The proximal aspect of the catheter was allowed to come back into the main pulmonary trunk to facilitate dating of the saddle embolus.  A 12 cm EKOS infusion catheter was then advanced over the second wire and positioned in the left lower lobe pulmonary artery.  The sheaths were secured in place with sterile bandages. Pulmonary arterial lysis was then initiated per standard protocol.  COMPLICATIONS: None  IMPRESSION: 1. Main pulmonary arterial pressure was 74/39 for a mean of 50 mmHg consistent with critical pulmonary arterial hypertension. 2. Bilateral pulmonary arteriography demonstrates large volume occlusive clot in the central right lower lobe pulmonary artery, and high burden of subocclusive caught throughout the left lower extremity pulmonary artery. 3. Initiation of bilateral ultrasound accelerated thrombolysis with EKOS catheters per code PE protocol.  Signed,  Sterling Big, MD  [facility]  [facility]   Electronically Signed   By: Malachy Moan M.D.   On: 05/28/2014 09:08   Ir US Guide Vasc Access Right  05/28/2014   CLINICAL DATA:  64 year old female with acute sub massive bordering on massive (borderline hypotension) large volume PE and CT evidence of right heart strain with significant respiratory distress and difficulty in maintaining oxygenation. Urgent ultrasound accelerated thrombolysis is warranted to  facilitate unloading of the pulmonary arterial pressure and right heart strain.  EXAM: BILATERAL PULMONARY ARTERIOGRAPHY; ADDITIONAL ARTERIOGRAPHY; IR ULTRASOUND GUIDANCE VASC ACCESS RIGHT; IR INFUSION THROMBOL VENOUS INITIAL (MS)  Date: 05/28/2014  PROCEDURE: 1. Ultrasound guided right common femoral venous access 2. Second ultrasound-guided right common femoral venous access 3. Main pulmonary artery pressure measurement 4. Left pulmonary arteriogram 5. Right pulmonary arteriogram 6. EKOS catheter placement in the right lower lobe pulmonary artery 7. EKOS catheter placement in the left lower lobe pulmonary artery 8. Initiation of bilateral pulmonary arterial thrombolysis Interventional Radiologist:  Sterling Big, MD  ANESTHESIA/SEDATION: Moderate (conscious) sedation was used. 0.5 mg Versed, 25 mcg Fentanyl were administered intravenously. The patient's vital signs were monitored continuously by radiology nursing throughout the procedure.  Sedation Time: 30 minutes  MEDICATIONS: TPA begun at 0.5 mg/hr in each catheter for a total of 1 mg/hr  FLUOROSCOPY TIME:  11 minutes 12 seconds  609 mGy  CONTRAST:  30mL OMNIPAQUE IOHEXOL 300 MG/ML  SOLN  TECHNIQUE:  Informed consent was obtained from the patient following explanation of the procedure, risks, benefits and alternatives. The patient understands, agrees and consents for the procedure. All questions were addressed. A time out was performed.  Maximal barrier sterile technique utilized including caps, mask, sterile gowns, sterile gloves, large sterile drape, hand hygiene, and Betadine skin prep.  The right groin was interrogated with ultrasound. The common femoral vein is distended and compressible. There is no evidence of thrombus. An image was obtained and stored for the medical record. Local anesthesia was attained by infiltration with 1% lidocaine. Two small dermatotomy server made. Using ultrasound guidance, the vessel was punctured with a 21 gauge  micropuncture needle. With the assistance of a micro wire, a 5 [place] transitional sheath was advanced into the vessel.  A second common femoral access was then obtained again using real-time sonographic guidance and a 21 gauge micropuncture needle and transitional 5 French micro sheath system. Both micro sheaths were then exchanged sequentially over 035 wires for working 6 [place] vascular sheaths.  A 5 French vert catheter was then navigated into the main pulmonary artery over a Bentson wire. Main pulmonary arterial pressures were obtained. The pulmonary arterial pressure was 74/39 for a mean of 50 mmHg. The 5 French catheter was next used to select the left main pulmonary artery. A limited left pulmonary arteriogram was performed. There is significant clot burden in the branches to the lower lobe. The catheter was navigated into the distal left lower lobe pulmonary artery. A Rosen wire was advanced into the distal left lower lobe pulmonary artery in the 5 French catheter removed. The 5 French catheter was then inserted into the second 6 [place] vascular sheath an again navigated into the main pulmonary artery over a Bentson wire. This time, the catheter was used to select the right main pulmonary artery. A limited right pulmonary arteriogram was performed demonstrating occlusive clot in the right lower lobe pulmonary artery confirming the findings of the recent CT scan. There is very poor perfusion of the right lung.  The catheter was advanced over the Bentson wire into the distal right lower lobe pulmonary artery. The Bentson wire was exchanged for a Rosen wire in the 5 [place] catheter removed. An 18 cm EKOS infusion catheter was then advanced over the wire and positioned in the right lower lobe pulmonary artery. The proximal aspect of the catheter was allowed to come back into the main pulmonary trunk to facilitate dating of the saddle embolus.  A 12 cm EKOS infusion catheter was then advanced over the second  wire and positioned in the left lower lobe pulmonary artery.  The sheaths were secured in place with sterile bandages. Pulmonary arterial lysis was then initiated per standard protocol.  COMPLICATIONS: None  IMPRESSION: 1. Main pulmonary arterial pressure was 74/39 for a mean of 50 mmHg consistent with critical pulmonary arterial hypertension. 2. Bilateral pulmonary arteriography demonstrates large volume occlusive clot in the central right lower lobe pulmonary artery, and high burden of subocclusive caught throughout the left lower extremity pulmonary artery. 3. Initiation of bilateral ultrasound accelerated thrombolysis with EKOS catheters per code PE protocol.  Signed,  Sterling Big, MD  [facility]  [facility]   Electronically Signed   By: Malachy Moan M.D.   On: 05/28/2014 09:08   Dg Chest Port 1 View  05/29/2014   CLINICAL DATA:  Bilateral pulmonary emboli  EXAM: PORTABLE CHEST - 1 VIEW  COMPARISON:  05/28/2014  FINDINGS: Cardiac shadow  remains enlarged. Bilateral EKOS catheters are again noted. And stable in appearance. Aortic calcifications are seen. The lungs are clear bilaterally.  IMPRESSION: Bilateral thrombolytic catheters.  No acute abnormality noted.   Electronically Signed   By: Alcide Clever M.D.   On: 05/29/2014 08:00   Dg Chest Port 1 View  05/28/2014   CLINICAL DATA:  Pulmonary embolism.  EXAM: PORTABLE CHEST - 1 VIEW  COMPARISON:  05/27/2014  FINDINGS: Exam demonstrates small caliber catheter is within the pulmonary arteries bilaterally. Lungs are hypoinflated with minimal hazy opacification in the left base/retrocardiac region slightly improved. Right lung is clear. Mild stable cardiomegaly. Remainder of the exam is unchanged.  IMPRESSION: Hypoinflation with interval improvement in mild hazy left base/ retrocardiac opacification.  Small caliber bilateral pulmonary arterial catheters.   Electronically Signed   By: Elberta Fortis  M.D.   On: 05/28/2014 10:14   Dg Chest Port 1 View  05/27/2014   CLINICAL DATA:  Four day history of shortness of breath  EXAM: PORTABLE CHEST - 1 VIEW  COMPARISON:  May 20, 2013  FINDINGS: There is left lower lobe airspace consolidation. Lungs are otherwise clear. Heart is mildly enlarged with pulmonary vascularity within normal limits. No adenopathy. No bone lesions. Left  IMPRESSION: Left lower lobe consolidation.  Mild cardiac prominence, stable.   Electronically Signed   By: Bretta Bang III M.D.   On: 05/27/2014 11:59   Ir Infusion Thrombol Venous Initial (ms)  05/28/2014   CLINICAL DATA:  64 year old female with acute sub massive bordering on massive (borderline hypotension) large volume PE and CT evidence of right heart strain with significant respiratory distress and difficulty in maintaining oxygenation. Urgent ultrasound accelerated thrombolysis is warranted to facilitate unloading of the pulmonary arterial pressure and right heart strain.  EXAM: BILATERAL PULMONARY ARTERIOGRAPHY; ADDITIONAL ARTERIOGRAPHY; IR ULTRASOUND GUIDANCE VASC ACCESS RIGHT; IR INFUSION THROMBOL VENOUS INITIAL (MS)  Date: 05/28/2014  PROCEDURE: 1. Ultrasound guided right common femoral venous access 2. Second ultrasound-guided right common femoral venous access 3. Main pulmonary artery pressure measurement 4. Left pulmonary arteriogram 5. Right pulmonary arteriogram 6. EKOS catheter placement in the right lower lobe pulmonary artery 7. EKOS catheter placement in the left lower lobe pulmonary artery 8. Initiation of bilateral pulmonary arterial thrombolysis Interventional Radiologist:  Sterling Big, MD  ANESTHESIA/SEDATION: Moderate (conscious) sedation was used. 0.5 mg Versed, 25 mcg Fentanyl were administered intravenously. The patient's vital signs were monitored continuously by radiology nursing throughout the procedure.  Sedation Time: 30 minutes  MEDICATIONS: TPA begun at 0.5 mg/hr in each catheter for a total  of 1 mg/hr  FLUOROSCOPY TIME:  11 minutes 12 seconds  609 mGy  CONTRAST:  30mL OMNIPAQUE IOHEXOL 300 MG/ML  SOLN  TECHNIQUE: Informed consent was obtained from the patient following explanation of the procedure, risks, benefits and alternatives. The patient understands, agrees and consents for the procedure. All questions were addressed. A time out was performed.  Maximal barrier sterile technique utilized including caps, mask, sterile gowns, sterile gloves, large sterile drape, hand hygiene, and Betadine skin prep.  The right groin was interrogated with ultrasound. The common femoral vein is distended and compressible. There is no evidence of thrombus. An image was obtained and stored for the medical record. Local anesthesia was attained by infiltration with 1% lidocaine. Two small dermatotomy server made. Using ultrasound guidance, the vessel was punctured with a 21 gauge micropuncture needle. With the assistance of a micro wire, a 5 [place] transitional sheath was advanced into the  vessel.  A second common femoral access was then obtained again using real-time sonographic guidance and a 21 gauge micropuncture needle and transitional 5 French micro sheath system. Both micro sheaths were then exchanged sequentially over 035 wires for working 6 [place] vascular sheaths.  A 5 French vert catheter was then navigated into the main pulmonary artery over a Bentson wire. Main pulmonary arterial pressures were obtained. The pulmonary arterial pressure was 74/39 for a mean of 50 mmHg. The 5 French catheter was next used to select the left main pulmonary artery. A limited left pulmonary arteriogram was performed. There is significant clot burden in the branches to the lower lobe. The catheter was navigated into the distal left lower lobe pulmonary artery. A Rosen wire was advanced into the distal left lower lobe pulmonary artery in the 5 French catheter removed. The 5 French catheter was then inserted into the second 6 [place]  vascular sheath an again navigated into the main pulmonary artery over a Bentson wire. This time, the catheter was used to select the right main pulmonary artery. A limited right pulmonary arteriogram was performed demonstrating occlusive clot in the right lower lobe pulmonary artery confirming the findings of the recent CT scan. There is very poor perfusion of the right lung.  The catheter was advanced over the Bentson wire into the distal right lower lobe pulmonary artery. The Bentson wire was exchanged for a Rosen wire in the 5 [place] catheter removed. An 18 cm EKOS infusion catheter was then advanced over the wire and positioned in the right lower lobe pulmonary artery. The proximal aspect of the catheter was allowed to come back into the main pulmonary trunk to facilitate dating of the saddle embolus.  A 12 cm EKOS infusion catheter was then advanced over the second wire and positioned in the left lower lobe pulmonary artery.  The sheaths were secured in place with sterile bandages. Pulmonary arterial lysis was then initiated per standard protocol.  COMPLICATIONS: None  IMPRESSION: 1. Main pulmonary arterial pressure was 74/39 for a mean of 50 mmHg consistent with critical pulmonary arterial hypertension. 2. Bilateral pulmonary arteriography demonstrates large volume occlusive clot in the central right lower lobe pulmonary artery, and high burden of subocclusive caught throughout the left lower extremity pulmonary artery. 3. Initiation of bilateral ultrasound accelerated thrombolysis with EKOS catheters per code PE protocol.  Signed,  Sterling Big, MD  [facility]  [facility]   Electronically Signed   By: Malachy Moan M.D.   On: 05/28/2014 09:08   Ir Rande Lawman F/u Eval Art/ven Final Day (ms)  05/29/2014   CLINICAL DATA:  64 year old female with sub massive a bordering on massive PE. Bilateral pulmonary artery ultrasound accelerated thrombolysis was  initiated on 05/24 2016. Due to a suboptimal clinical result after initial 12 hours and 24 mg tPA, lysis was repeated for an additional 12 hours and 24 mg tPA. Patient now presents after significant clinical improvement for repeat pressure measurements and catheter removal.  EXAM: IR THROMB F/U EVAL ART/VEN FINAL DAY  Date: 05/29/2014  PROCEDURE: 1. Catheter repositioning under fluoroscopy 2. Pulmonary arterial pressure measurement 3. Removal of slices catheters and vascular sheaths Interventional Radiologist:  Sterling Big, MD  ANESTHESIA/SEDATION: None required  MEDICATIONS: None required  FLUOROSCOPY TIME:  54 seconds  33.8 mGy  CONTRAST:  None  TECHNIQUE: Informed consent was obtained from the patient following explanation of the procedure, risks, benefits and alternatives. The patient understands, agrees and consents for  the procedure. All questions were addressed. A time out was performed.  Under fluoroscopic guidance, the right pulmonary artery lysis catheter was brought back so that the end hole was within the main pulmonary artery. The pressure transducer was then connected to the catheter which was flushed, aspirated and 0 to. Main pulmonary arterial pressure stents were then obtained. The main pulmonary arterial pressure is 56/24 for a mean of 34 mm Hg.  The catheter was removed. Similarly, the left pulmonary artery catheter was brought back into the main pulmonary artery in the pressure repeated with similar measurements. This catheter was removed. The vascular sheaths were then pulled and hemostasis attained by manual pressure.  COMPLICATIONS: None  IMPRESSION: 1. Significant improvement in main pulmonary arterial pressure to 56/24 (mean 34) mmHg following a total of 24 hours of a bilateral pulmonary arterial lie cyst using a total of 48 mg tPA. This occurred over a 36 hour period.  Signed,  Sterling Big, MD  [facility]  [facility]    Electronically Signed   By: Malachy Moan M.D.   On: 05/29/2014 12:37    Labs:  CBC:  Recent Labs  05/28/14 1302 05/29/14 0135 05/29/14 0430 05/30/14 0404  WBC 14.0* 13.3* 12.6* 12.7*  HGB 10.2* 10.5* 9.8* 10.5*  HCT 32.0* 33.3* 31.6* 33.0*  PLT 94* 82* 95* 111*    COAGS:  Recent Labs  05/27/14 1744  INR 1.52*    BMP:  Recent Labs  05/29/14 0230 05/29/14 0430 05/29/14 1947 05/30/14 0404  NA 135 135 134* 136  K 5.2* 5.0 5.5* 5.0  CL 108 107 105 105  CO2 17* 17* 15* 19*  GLUCOSE 84 78 143* 109*  BUN 43* 44* 37* 31*  CALCIUM 8.5* 8.4* 8.8* 9.0  CREATININE 1.96* 1.90* 1.42* 1.25*  GFRNONAA 26* 27* 38* 44*  GFRAA 30* 31* 44* 52*    LIVER FUNCTION TESTS:  Recent Labs  05/30/14 0404  BILITOT 0.7  AST 326*  ALT 621*  ALKPHOS 312*  PROT 6.9  ALBUMIN 3.1*    Assessment and Plan:  Pulmonary Embolus S/P Lysis Anticoagulation per Pulmonary/Medicine. Will sign off.   Call with any issues.  SignedGolden Pop 05/30/2014, 8:20 AM   I spent a total of 15 Minutes in face to face in clinical consultation/evaluation, greater than 50% of which was counseling/coordinating care for pulmonary embolus, s/p lysis.

## 2014-05-30 NOTE — Progress Notes (Signed)
[facility] PROGRESS NOTE Interim History: 64 y.o. F [facility] [address] [facility] with severe SOB. Found to have PE with CT evidence of RHS. PCCM called for admission.    Assessment/Plan: Pulmonary emboli leading to right heart enlargement: CTA of the chest was done that showed a saddle PE with evidence of right heart strain with a ratio 1.12. PCCM was consulted he was given thrombolytics his blood pressure heart rate respiration improved. He is now satting greater than 90% on 5 L. Will continue IV heparin will discuss with patient Coumadin versus NOAC's Lowe ext doppler showed no DVT  Thrombocytopenia: Most likely consumption coagulopathy due to. No signs of overt bleeding GU monitor platelets. Hypercoagulable panel pending.  Acute on Chronic renal disease stage III: - Improvement IV hydration after lytics. We'll continue to monitor creatinine. -Most likely due to mild shock.   Leukocytosis: Has remained afebrile most likely stress emargination.  Controlled diabetes mellitus type 2: Lives diet was started on low-dose Levemir  Code Status: full Family Communication: none  Disposition Plan: inpatient   Consultants:  PCCM  Procedures:  CT angio chest  Antibiotics:  None  HPI/Subjective: She relates her shortness of breath is better, she is ambulate at least to the faucet which she could not do before. She is on MiraLAX on a regular diet.   Objective: Filed Vitals:   05/29/14 1957 05/29/14 2006 05/30/14 0435 05/30/14 0437  BP: 125/109  141/98   Pulse: 109 111 101   Temp: 97.6 F (36.4 C)  98.1 F (36.7 C)   TempSrc: Oral  Oral   Resp: 22 22 22    Height:      Weight:    138.347 kg (305 lb)  SpO2: 100% 97% 99%     Intake/Output Summary (Last 24 hours) at 05/30/14 1011 Last data filed at 05/29/14 1851  Gross per 24 hour  Intake 573.68 ml  Output    750 ml  Net -176.32 ml   Filed Weights   05/28/14 0349 05/29/14 0500 05/30/14 0437  Weight: 140  kg (308 lb 10.3 oz) 149.4 kg (329 lb 5.9 oz) 138.347 kg (305 lb)    Exam:  General: Alert, awake, oriented x3, in no acute distress.  HEENT: No bruits, no goiter.  Heart: Regular rate and rhythm. Lungs: Good air movement, clear Abdomen: Soft, nontender, nondistended, positive bowel sounds.  Neuro: Grossly intact, nonfocal.   Data Reviewed: Basic Metabolic Panel:  Recent Labs Lab 05/28/14 0350  05/29/14 0120 05/29/14 0230 05/29/14 0430 05/29/14 1947 05/30/14 0404  NA 136  < > 135 135 135 134* 136  K 5.3*  < > 6.4* 5.2* 5.0 5.5* 5.0  CL 107  < > 108 108 107 105 105  CO2 18*  --  17* 17* 17* 15* 19*  GLUCOSE 160*  < > 83 84 78 143* 109*  BUN 33*  < > 44* 43* 44* 37* 31*  CREATININE 2.07*  < > 2.03* 1.96* 1.90* 1.42* 1.25*  CALCIUM 8.3*  --  8.5* 8.5* 8.4* 8.8* 9.0  MG 2.0  --   --   --   --   --   --   PHOS 5.4*  --   --   --   --   --   --   < > = values in this interval not displayed. Liver Function Tests:  Recent Labs Lab 05/30/14 0404  AST 326*  ALT 621*  ALKPHOS 312*  BILITOT 0.7  PROT 6.9  ALBUMIN 3.1*   No results for input(s): LIPASE, AMYLASE in the last 168 hours. No results for input(s): AMMONIA in the last 168 hours. CBC:  Recent Labs Lab 05/28/14 0824 05/28/14 0958 05/28/14 1302 05/29/14 0135 05/29/14 0430 05/30/14 0404  WBC 11.0*  --  14.0* 13.3* 12.6* 12.7*  NEUTROABS  --   --   --   --   --  10.1*  HGB 8.4* 10.9* 10.2* 10.5* 9.8* 10.5*  HCT 26.5* 32.0* 32.0* 33.3* 31.6* 33.0*  MCV 73.6*  --  74.2* 74.3* 74.2* 74.7*  PLT 85*  --  94* 82* 95* 111*   Cardiac Enzymes:  Recent Labs Lab 05/27/14 1745 05/27/14 2230 05/28/14 0350  TROPONINI 0.06* 0.14* 0.37*   BNP (last 3 results)  Recent Labs  05/27/14 1139  BNP 969.9*    ProBNP (last 3 results) No results for input(s): PROBNP in the last 8760 hours.  CBG:  Recent Labs Lab 05/29/14 1129 05/29/14 1610 05/29/14 2032 05/30/14 0001 05/30/14 0418  GLUCAP 178* 198* 151*  166* 115*    Recent Results (from the past 240 hour(s))  MRSA PCR Screening     Status: None   Collection Time: 05/27/14 10:20 PM  Result Value Ref Range Status   MRSA by PCR NEGATIVE NEGATIVE Final    Comment:        The GeneXpert MRSA Assay (FDA approved for NASAL specimens only), is one component of a comprehensive MRSA colonization surveillance program. It is not intended to diagnose MRSA infection nor to guide or monitor treatment for MRSA infections.      Studies: Dg Chest Port 1 View  05/29/2014   CLINICAL DATA:  Bilateral pulmonary emboli  EXAM: PORTABLE CHEST - 1 VIEW  COMPARISON:  05/28/2014  FINDINGS: Cardiac shadow remains enlarged. Bilateral EKOS catheters are again noted. And stable in appearance. Aortic calcifications are seen. The lungs are clear bilaterally.  IMPRESSION: Bilateral thrombolytic catheters.  No acute abnormality noted.   Electronically Signed   By: Alcide Clever M.D.   On: 05/29/2014 08:00   Ir Rande Lawman F/u Eval Art/ven Final Day (ms)  05/29/2014   CLINICAL DATA:  64 year old female with sub massive a bordering on massive PE. Bilateral pulmonary artery ultrasound accelerated thrombolysis was initiated on 05/24 2016. Due to a suboptimal clinical result after initial 12 hours and 24 mg tPA, lysis was repeated for an additional 12 hours and 24 mg tPA. Patient now presents after significant clinical improvement for repeat pressure measurements and catheter removal.  EXAM: IR THROMB F/U EVAL ART/VEN FINAL DAY  Date: 05/29/2014  PROCEDURE: 1. Catheter repositioning under fluoroscopy 2. Pulmonary arterial pressure measurement 3. Removal of slices catheters and vascular sheaths Interventional Radiologist:  Sterling Big, MD  ANESTHESIA/SEDATION: None required  MEDICATIONS: None required  FLUOROSCOPY TIME:  54 seconds  33.8 mGy  CONTRAST:  None  TECHNIQUE: Informed consent was obtained from the patient following explanation of the procedure, risks, benefits and  alternatives. The patient understands, agrees and consents for the procedure. All questions were addressed. A time out was performed.  Under fluoroscopic guidance, the right pulmonary artery lysis catheter was brought back so that the end hole was within the main pulmonary artery. The pressure transducer was then connected to the catheter which was flushed, aspirated and 0 to. Main pulmonary arterial pressure stents were then obtained. The main pulmonary arterial pressure is 56/24 for a mean of 34 mm Hg.  The catheter was removed. Similarly, the left pulmonary artery  catheter was brought back into the main pulmonary artery in the pressure repeated with similar measurements. This catheter was removed. The vascular sheaths were then pulled and hemostasis attained by manual pressure.  COMPLICATIONS: None  IMPRESSION: 1. Significant improvement in main pulmonary arterial pressure to 56/24 (mean 34) mmHg following a total of 24 hours of a bilateral pulmonary arterial lie cyst using a total of 48 mg tPA. This occurred over a 36 hour period.  Signed,  Sterling Big, MD  [facility]  [facility]   Electronically Signed   By: Malachy Moan M.D.   On: 05/29/2014 12:37    Scheduled Meds: . arformoterol  15 mcg Nebulization BID  . budesonide  0.5 mg Nebulization BID  . buPROPion  100 mg Oral BID  . insulin aspart  0-20 Units Subcutaneous 6 times per day  . pantoprazole (PROTONIX) IV  40 mg Intravenous Q24H  . sodium chloride  3 mL Intravenous Q12H   Continuous Infusions: . sodium chloride 10 mL/hr at 05/30/14 0518  . sodium chloride Stopped (05/29/14 1100)  . sodium chloride Stopped (05/29/14 1100)  . sodium chloride Stopped (05/29/14 1100)  . sodium chloride Stopped (05/29/14 1100)  . heparin 1,550 Units/hr (05/30/14 4098)    Time Spent: 25 min   Marinda Elk  [facility] Pager [phone]. If 7PM-7AM, please contact night-coverage at  [email or web address], password [facility] 05/30/2014, 10:11 AM  LOS: 3 days

## 2014-05-30 NOTE — Progress Notes (Signed)
  Echocardiogram 2D Echocardiogram has been performed.  Dorothey BasemanReel, Jesus Nevills M 05/30/2014, 9:29 AM

## 2014-05-30 NOTE — Progress Notes (Signed)
Utilization review completed.  

## 2014-05-30 NOTE — Progress Notes (Signed)
Patient refused CPAP for tonight stated she would be leaving tomorrow.

## 2014-05-30 NOTE — Progress Notes (Addendum)
ANTICOAGULATION CONSULT NOTE - Follow Up Consult  Pharmacy Consult for Heparin Indication: pulmonary embolus  No Known Allergies  Patient Measurements: Height: 5\' 7"  (170.2 cm) Weight: (!) 305 lb (138.347 kg) IBW/kg (Calculated) : 61.6 Heparin Dosing Weight: 95.9  Vital Signs: Temp: 98.2 F (36.8 C) (05/27 1348) Temp Source: Oral (05/27 1348) BP: 165/86 mmHg (05/27 1348) Pulse Rate: 102 (05/27 1348)  Labs:  Recent Labs  05/27/14 1744 05/27/14 1745  05/27/14 2230 05/28/14 0350  05/29/14 0135  05/29/14 0430 05/29/14 1214 05/29/14 1947 05/30/14 0404 05/30/14 1232  HGB  --   --   < > 11.5* 11.2*  11.3*  < > 10.5*  --  9.8*  --   --  10.5*  --   HCT  --   --   < > 36.7 34.6*  34.7*  < > 33.3*  --  31.6*  --   --  33.0*  --   PLT  --   --   < > 129* 123*  123*  < > 82*  --  95*  --   --  111*  --   LABPROT 18.3*  --   --   --   --   --   --   --   --   --   --   --   --   INR 1.52*  --   --   --   --   --   --   --   --   --   --   --   --   HEPARINUNFRC  --   --   < > 0.65 >2.20*  < >  --   --  0.83* 0.72*  --  0.16* 0.22*  CREATININE  --   --   --   --  2.07*  < >  --   < > 1.90*  --  1.42* 1.25*  --   TROPONINI  --  0.06*  --  0.14* 0.37*  --   --   --   --   --   --   --   --   < > = values in this interval not displayed.  Estimated Creatinine Clearance: 66.3 mL/min (by C-G formula based on Cr of 1.25).   Medications:  Infusions:  . heparin 1,550 Units/hr (05/30/14 16100626)    Assessment: 64 year old female with extensive PE s/p EKOS protocol (removed catheters this AM). Heparin was not stopped and continued to run at 1500 units/hr.    This AM HL is subtherapeutic after slight reduction to heparin 1350 units/hr. Initial supratherapeutic levels likely related to lytics given. Nurse reports no issues with infusion or bleeding. Heparin was increased 1550 uts/hr with HL still low at 0.22  Goal of Therapy:  Heparin level 0.3-0.7 units/ml Monitor platelets by  anticoagulation protocol: Yes   Plan:  Increase Heparin at 1750 units/hr 6h HL Daily Heparin level and CBC F/up Platelets closely and determine if need to switch to DTI Monitor for signs and symptoms of bleeding.    Leota SauersLisa Curran Pharm.D. CPP, BCPS Clinical Pharmacist [phone] 05/30/2014 2:29 PM   Addendum  Heparin level came back at 0.4 this PM.  Plan  Cont same rate then f/u with confirm level in AM  Ulyses SouthwardMinh Pham, PharmD Pager: [phone] 05/30/2014 9:29 PM

## 2014-05-30 NOTE — Progress Notes (Signed)
ANTICOAGULATION CONSULT NOTE - Follow Up Consult  Pharmacy Consult for Heparin Indication: pulmonary embolus  No Known Allergies  Patient Measurements: Height: 5\' 7"  (170.2 cm) Weight: (!) 305 lb (138.347 kg) IBW/kg (Calculated) : 61.6 Heparin Dosing Weight: 95.9  Vital Signs: Temp: 98.1 F (36.7 C) (05/27 0435) Temp Source: Oral (05/27 0435) BP: 141/98 mmHg (05/27 0435) Pulse Rate: 101 (05/27 0435)  Labs:  Recent Labs  05/27/14 1744 05/27/14 1745  05/27/14 2230 05/28/14 0350  05/28/14 1302  05/29/14 0135  05/29/14 0430 05/29/14 1214 05/29/14 1947 05/30/14 0404  HGB  --   --   --  11.5* 11.2*  11.3*  < > 10.2*  --  10.5*  --  9.8*  --   --   --   HCT  --   --   --  36.7 34.6*  34.7*  < > 32.0*  --  33.3*  --  31.6*  --   --   --   PLT  --   --   --  129* 123*  123*  < > 94*  --  82*  --  95*  --   --   --   LABPROT 18.3*  --   --   --   --   --   --   --   --   --   --   --   --   --   INR 1.52*  --   --   --   --   --   --   --   --   --   --   --   --   --   HEPARINUNFRC  --   --   < > 0.65 >2.20*  < >  --   < >  --   --  0.83* 0.72*  --  0.16*  CREATININE  --   --   --   --  2.07*  < >  --   < >  --   < > 1.90*  --  1.42* 1.25*  TROPONINI  --  0.06*  --  0.14* 0.37*  --   --   --   --   --   --   --   --   --   < > = values in this interval not displayed.  Estimated Creatinine Clearance: 66.3 mL/min (by C-G formula based on Cr of 1.25).   Medications:  Infusions:  . sodium chloride 10 mL/hr at 05/30/14 0518  . sodium chloride Stopped (05/29/14 1100)  . sodium chloride Stopped (05/29/14 1100)  . sodium chloride Stopped (05/29/14 1100)  . sodium chloride Stopped (05/29/14 1100)  . heparin 1,350 Units/hr (05/29/14 1254)    Assessment: 64 year old female with extensive PE s/p EKOS protocol (removed catheters this AM). Heparin was not stopped and remains running at 1500 units/hr.   Repeat heparin level is slightly supra-therapeutic at 0.72 on 1400 units/hr  and drawn via peripheral stick. No bleeding reported.     Platelets are down in 90s but remaining stable with no further drops. Discussed with Dr. Tyson AliasFeinstein - feels this is consumption due to major clot burden will continue to follow. No change to DTI at this time.   This AM HL is subtherapeutic after slight reduction to heparin 1350 units/hr. Initial supratherapeutic levels likely related to lytics given. Nurse reports no issues with infusion or bleeding.  Goal of Therapy:  Heparin level 0.3-0.7 units/ml Monitor  platelets by anticoagulation protocol: Yes   Plan:  Decrease Heparin at 1550 units/hr 6h HL Daily Heparin level and CBC F/up Platelets closely and determine if need to switch to DTI Monitor for signs and symptoms of bleeding.   Arlean Hopping. Newman Pies, PharmD Clinical Pharmacist Pager [phone]  05/30/2014,6:20 AM

## 2014-05-31 ENCOUNTER — Other Ambulatory Visit: Payer: Self-pay

## 2014-05-31 DIAGNOSIS — E119 Type 2 diabetes mellitus without complications: Secondary | ICD-10-CM

## 2014-05-31 DIAGNOSIS — I517 Cardiomegaly: Secondary | ICD-10-CM

## 2014-05-31 LAB — GLUCOSE, CAPILLARY
GLUCOSE-CAPILLARY: 142 mg/dL — AB (ref 65–99)
Glucose-Capillary: 128 mg/dL — ABNORMAL HIGH (ref 65–99)
Glucose-Capillary: 132 mg/dL — ABNORMAL HIGH (ref 65–99)
Glucose-Capillary: 138 mg/dL — ABNORMAL HIGH (ref 65–99)
Glucose-Capillary: 155 mg/dL — ABNORMAL HIGH (ref 65–99)
Glucose-Capillary: 160 mg/dL — ABNORMAL HIGH (ref 65–99)

## 2014-05-31 LAB — CBC
HEMATOCRIT: 30.1 % — AB (ref 36.0–46.0)
HEMOGLOBIN: 9.4 g/dL — AB (ref 12.0–15.0)
MCH: 23.1 pg — ABNORMAL LOW (ref 26.0–34.0)
MCHC: 31.2 g/dL (ref 30.0–36.0)
MCV: 74 fL — ABNORMAL LOW (ref 78.0–100.0)
PLATELETS: 144 10*3/uL — AB (ref 150–400)
RBC: 4.07 MIL/uL (ref 3.87–5.11)
RDW: 20.6 % — ABNORMAL HIGH (ref 11.5–15.5)
WBC: 12.9 10*3/uL — ABNORMAL HIGH (ref 4.0–10.5)

## 2014-05-31 LAB — HEPARIN LEVEL (UNFRACTIONATED)
Heparin Unfractionated: 0.2 IU/mL — ABNORMAL LOW (ref 0.30–0.70)
Heparin Unfractionated: 0.24 IU/mL — ABNORMAL LOW (ref 0.30–0.70)

## 2014-05-31 MED ORDER — CARVEDILOL 3.125 MG PO TABS
3.1250 mg | ORAL_TABLET | Freq: Two times a day (BID) | ORAL | Status: DC
  Administered 2014-05-31 – 2014-06-01 (×2): 3.125 mg via ORAL
  Filled 2014-05-31 (×4): qty 1

## 2014-05-31 MED ORDER — APIXABAN 5 MG PO TABS
10.0000 mg | ORAL_TABLET | Freq: Two times a day (BID) | ORAL | Status: DC
  Administered 2014-05-31 – 2014-06-02 (×5): 10 mg via ORAL
  Filled 2014-05-31 (×6): qty 2

## 2014-05-31 MED ORDER — HEPARIN BOLUS VIA INFUSION
2000.0000 [IU] | Freq: Once | INTRAVENOUS | Status: AC
  Administered 2014-05-31: 2000 [IU] via INTRAVENOUS
  Filled 2014-05-31: qty 2000

## 2014-05-31 MED ORDER — HYDROCHLOROTHIAZIDE 25 MG PO TABS
25.0000 mg | ORAL_TABLET | Freq: Every day | ORAL | Status: DC
  Administered 2014-05-31 – 2014-06-02 (×3): 25 mg via ORAL
  Filled 2014-05-31 (×3): qty 1

## 2014-05-31 MED ORDER — APIXABAN 5 MG PO TABS: 5.0000 mg | ORAL_TABLET | Freq: Two times a day (BID) | ORAL | Status: DC

## 2014-05-31 MED ORDER — AMLODIPINE BESYLATE 10 MG PO TABS
10.0000 mg | ORAL_TABLET | Freq: Every day | ORAL | Status: DC
  Administered 2014-05-31 – 2014-06-01 (×2): 10 mg via ORAL
  Filled 2014-05-31 (×2): qty 1

## 2014-05-31 NOTE — Progress Notes (Signed)
ANTICOAGULATION CONSULT NOTE  Pharmacy Consult for Heparin Indication: pulmonary embolus  No Known Allergies  Patient Measurements: Height: 5\' 7"  (170.2 cm) Weight: 299 lb (135.626 kg) IBW/kg (Calculated) : 61.6 Heparin Dosing Weight: 95.9  Vital Signs: Temp: 98 F (36.7 C) (05/28 0359) Temp Source: Oral (05/28 0359) BP: 165/95 mmHg (05/28 0359) Pulse Rate: 99 (05/28 0359)  Labs:  Recent Labs  05/29/14 0135  05/29/14 0430  05/29/14 1947 05/30/14 0404 05/30/14 1232 05/30/14 1936 05/30/14 2030 05/31/14 0334  HGB 10.5*  --  9.8*  --   --  10.5*  --   --   --   --   HCT 33.3*  --  31.6*  --   --  33.0*  --   --   --   --   PLT 82*  --  95*  --   --  111*  --   --   --   --   HEPARINUNFRC  --   --  0.83*  < >  --  0.16* 0.22*  --  0.40 0.20*  CREATININE  --   < > 1.90*  --  1.42* 1.25*  --  1.09*  --   --   < > = values in this interval not displayed.  Estimated Creatinine Clearance: 75.1 mL/min (by C-G formula based on Cr of 1.09).  Assessment: 64 year old female with extensive PE s/p EKOS protocol for heparin  Goal of Therapy:  Heparin level 0.3-0.7 units/ml Monitor platelets by anticoagulation protocol: Yes   Plan:  Heparin 2000 units IV bolus, then increase heparin 2000 units/hr Check heparin level in 8 hours.  Geannie RisenGreg Kloi Brodman, PharmD, BCPS

## 2014-05-31 NOTE — Progress Notes (Signed)
[facility] PROGRESS NOTE Interim History: 64 y.o. F [facility] with severe SOB. Found to have PE with CT evidence of RHS. PCCM called for admission.    Assessment/Plan: Pulmonary emboli leading to right heart enlargement: CTA of the chest was done that showed a saddle PE with evidence of right heart strain with a ratio 1.12. PCCM was consulted he was given thrombolytics. He is now satting greater than 90% on 5 L. Will continue IV heparin will discuss with patient Coumadin versus NOAC's [place] ext doppler showed no DVT Consult PT. Restart some of her antihypertensive medications.  Thrombocytopenia: Most likely consumption coagulopathy due to. PLT's cont to improve. Hypercoagulable panel pending.  Acute on Chronic renal disease stage III: - Improvement IV hydration after lytics. We'll continue to monitor creatinine. - Most likely due to mild shock.   Leukocytosis: Has remained afebrile most likely stress emargination.  Controlled diabetes mellitus type 2: Lives diet was started on low-dose Levemir  Code Status: full Family Communication: none  Disposition Plan: inpatient   Consultants:  PCCM  Procedures:  CT angio chest  Antibiotics:  None  HPI/Subjective: No complains  Objective: Filed Vitals:   05/30/14 2000 05/30/14 2104 05/31/14 0359 05/31/14 0813  BP:  146/87 165/95   Pulse: 104 106 99   Temp:  98 F (36.7 C) 98 F (36.7 C)   TempSrc:  Oral Oral   Resp: 22 18 18    Height:      Weight:   135.626 kg (299 lb)   SpO2: 93% 99% 100% 98%    Intake/Output Summary (Last 24 hours) at 05/31/14 0948 Last data filed at 05/31/14 0830  Gross per 24 hour  Intake    600 ml  Output    800 ml  Net   -200 ml   Filed Weights   05/29/14 0500 05/30/14 0437 05/31/14 0359  Weight: 149.4 kg (329 lb 5.9 oz) 138.347 kg (305 lb) 135.626 kg (299 lb)    Exam:  General: Alert, awake, oriented x3, in no acute distress.  HEENT: No bruits, no  goiter.  Heart: Regular rate and rhythm. Lungs: Good air movement, clear Abdomen: Soft, nontender, nondistended, positive bowel sounds.  Neuro: Grossly intact, nonfocal.   Data Reviewed: Basic Metabolic Panel:  Recent Labs Lab 05/28/14 0350  05/29/14 0230 05/29/14 0430 05/29/14 1947 05/30/14 0404 05/30/14 1936  NA 136  < > 135 135 134* 136 135  K 5.3*  < > 5.2* 5.0 5.5* 5.0 5.0  CL 107  < > 108 107 105 105 106  CO2 18*  < > 17* 17* 15* 19* 20*  GLUCOSE 160*  < > 84 78 143* 109* 181*  BUN 33*  < > 43* 44* 37* 31* 22*  CREATININE 2.07*  < > 1.96* 1.90* 1.42* 1.25* 1.09*  CALCIUM 8.3*  < > 8.5* 8.4* 8.8* 9.0 8.7*  MG 2.0  --   --   --   --   --   --   PHOS 5.4*  --   --   --   --   --   --   < > = values in this interval not displayed. Liver Function Tests:  Recent Labs Lab 05/30/14 0404  AST 326*  ALT 621*  ALKPHOS 312*  BILITOT 0.7  PROT 6.9  ALBUMIN 3.1*   No results for input(s): LIPASE, AMYLASE in the last 168 hours. No results for input(s): AMMONIA in the last 168 hours. CBC:  Recent Labs Lab 05/28/14 1302 05/29/14 0135 05/29/14 0430 05/30/14 0404 05/31/14 0535  WBC 14.0* 13.3* 12.6* 12.7* 12.9*  NEUTROABS  --   --   --  10.1*  --   HGB 10.2* 10.5* 9.8* 10.5* 9.4*  HCT 32.0* 33.3* 31.6* 33.0* 30.1*  MCV 74.2* 74.3* 74.2* 74.7* 74.0*  PLT 94* 82* 95* 111* 144*   Cardiac Enzymes:  Recent Labs Lab 05/27/14 1745 05/27/14 2230 05/28/14 0350  TROPONINI 0.06* 0.14* 0.37*   BNP (last 3 results)  Recent Labs  05/27/14 1139  BNP 969.9*    ProBNP (last 3 results) No results for input(s): PROBNP in the last 8760 hours.  CBG:  Recent Labs Lab 05/30/14 1604 05/30/14 2102 05/31/14 0157 05/31/14 0357 05/31/14 0801  GLUCAP 153* 170* 142* 138* 128*    Recent Results (from the past 240 hour(s))  MRSA PCR Screening     Status: None   Collection Time: 05/27/14 10:20 PM  Result Value Ref Range Status   MRSA by PCR NEGATIVE NEGATIVE Final     Comment:        The GeneXpert MRSA Assay (FDA approved for NASAL specimens only), is one component of a comprehensive MRSA colonization surveillance program. It is not intended to diagnose MRSA infection nor to guide or monitor treatment for MRSA infections.      Studies: No results found.  Scheduled Meds: . arformoterol  15 mcg Nebulization BID  . budesonide  0.5 mg Nebulization BID  . buPROPion  100 mg Oral BID  . insulin aspart  0-20 Units Subcutaneous 6 times per day  . insulin detemir  5 Units Subcutaneous Daily  . metoprolol tartrate  12.5 mg Oral BID  . pantoprazole  40 mg Oral Daily  . sodium chloride  3 mL Intravenous Q12H   Continuous Infusions: . heparin 2,000 Units/hr (05/31/14 0426)    Time Spent: 25 min   Marinda Elk  [facility] Pager [phone]. If 7PM-7AM, please contact night-coverage at [email or web address], password [facility] 05/31/2014, 9:48 AM  LOS: 4 days

## 2014-05-31 NOTE — Progress Notes (Signed)
ANTICOAGULATION CONSULT NOTE - Follow Up Consult  Pharmacy Consult for Heparin Indication: pulmonary embolus  No Known Allergies  Patient Measurements: Height: 5\' 7"  (170.2 cm) Weight: 299 lb (135.626 kg) IBW/kg (Calculated) : 61.6 Heparin Dosing Weight: 95.9  Vital Signs: Temp: 97.6 F (36.4 C) (05/28 1447) Temp Source: Oral (05/28 1447) BP: 161/102 mmHg (05/28 1447) Pulse Rate: 98 (05/28 1447)  Labs:  Recent Labs  05/29/14 0430  05/29/14 1947 05/30/14 0404  05/30/14 1936 05/30/14 2030 05/31/14 0334 05/31/14 0535 05/31/14 1310  HGB 9.8*  --   --  10.5*  --   --   --   --  9.4*  --   HCT 31.6*  --   --  33.0*  --   --   --   --  30.1*  --   PLT 95*  --   --  111*  --   --   --   --  144*  --   HEPARINUNFRC 0.83*  < >  --  0.16*  < >  --  0.40 0.20*  --  0.24*  CREATININE 1.90*  --  1.42* 1.25*  --  1.09*  --   --   --   --   < > = values in this interval not displayed.  Estimated Creatinine Clearance: 75.1 mL/min (by C-G formula based on Cr of 1.09).   Medications:  Infusions:     Assessment: 64 year old female with extensive PE s/p EKOS protocol. Heparin was not stopped and continued to run at 1500 units/hr.    This AM HL was subtherapeutic and heparin drip rate increased.  IV line was pulled out and unable to be put back in.  Awaiting IV team to plaice line.  Discussed with MD to convert to po anticoagulation.    5/28 ECHO LVEF 35% ( 40% -2 yrago) with mod RV dysfunction post PE.   Coreg was started, consider ACEI ( was on lisinopril pta) K+ is 5 may not want to start  Spironolactone at this time.  Goal of Therapy:   Monitor platelets by anticoagulation protocol: Yes   Plan:  D/c heparin Apixaban 10mg  BID x7 days then 5mg  BID  Leota SauersLisa Chosen Garron Pharm.D. CPP, BCPS Clinical Pharmacist [phone] 05/31/2014 2:58 PM

## 2014-05-31 NOTE — Progress Notes (Signed)
Assess Patient with ultrasound for PIV.  Attempt x 2 without success.  No suitable veins.

## 2014-05-31 NOTE — Progress Notes (Signed)
Talked with patient about wearing CPAP, still refusing. Patient states that she does not feel comfortable in it.  RT will continue to monitor.

## 2014-06-01 LAB — CBC
HCT: 29 % — ABNORMAL LOW (ref 36.0–46.0)
Hemoglobin: 9.3 g/dL — ABNORMAL LOW (ref 12.0–15.0)
MCH: 23.8 pg — ABNORMAL LOW (ref 26.0–34.0)
MCHC: 32.1 g/dL (ref 30.0–36.0)
MCV: 74.2 fL — ABNORMAL LOW (ref 78.0–100.0)
PLATELETS: 165 10*3/uL (ref 150–400)
RBC: 3.91 MIL/uL (ref 3.87–5.11)
RDW: 21.3 % — AB (ref 11.5–15.5)
WBC: 11.7 10*3/uL — AB (ref 4.0–10.5)

## 2014-06-01 LAB — GLUCOSE, CAPILLARY
GLUCOSE-CAPILLARY: 121 mg/dL — AB (ref 65–99)
Glucose-Capillary: 149 mg/dL — ABNORMAL HIGH (ref 65–99)
Glucose-Capillary: 143 mg/dL — ABNORMAL HIGH (ref 65–99)
Glucose-Capillary: 152 mg/dL — ABNORMAL HIGH (ref 65–99)
Glucose-Capillary: 161 mg/dL — ABNORMAL HIGH (ref 65–99)

## 2014-06-01 MED ORDER — AMLODIPINE-OLMESARTAN 5-20 MG PO TABS: 1.0000 | ORAL_TABLET | Freq: Every day | ORAL | Status: DC

## 2014-06-01 MED ORDER — CARVEDILOL 6.25 MG PO TABS
6.2500 mg | ORAL_TABLET | Freq: Two times a day (BID) | ORAL | Status: DC
  Administered 2014-06-01 – 2014-06-02 (×2): 6.25 mg via ORAL
  Filled 2014-06-01 (×4): qty 1

## 2014-06-01 MED ORDER — AMLODIPINE BESYLATE 5 MG PO TABS
5.0000 mg | ORAL_TABLET | Freq: Every day | ORAL | Status: DC
  Filled 2014-06-01: qty 1

## 2014-06-01 MED ORDER — IRBESARTAN 150 MG PO TABS
150.0000 mg | ORAL_TABLET | Freq: Every day | ORAL | Status: DC
  Administered 2014-06-01 – 2014-06-02 (×2): 150 mg via ORAL
  Filled 2014-06-01 (×2): qty 1

## 2014-06-01 NOTE — Progress Notes (Signed)
Pt. Refuses CPAP at this time. Pt. Is aware to inform RT or RN anytime during the night if she decides to wear CPAP. RN is aware that pt. Refused.

## 2014-06-01 NOTE — Discharge Instructions (Signed)
Information on my medicine - ELIQUIS (apixaban)  This medication education was reviewed with me or my healthcare representative as part of my discharge preparation.    Why was Eliquis prescribed for you? Eliquis was prescribed to treat blood clots that may have been found in the veins of your legs (deep vein thrombosis) and in your lungs (pulmonary embolism) and to reduce the risk of them occurring again.  What do You need to know about Eliquis ? The starting dose is 10 mg (two 5 mg tablets) taken TWICE daily for the FIRST SEVEN (7) DAYS, then on June 04/2014 Saturday the dose is reduced to ONE 5 mg tablet taken TWICE daily.  Eliquis may be taken with or without food.   Try to take the dose about the same time in the morning and in the evening. If you have difficulty swallowing the tablet whole please discuss with your pharmacist how to take the medication safely.  Take Eliquis exactly as prescribed and DO NOT stop taking Eliquis without talking to the doctor who prescribed the medication.  Stopping may increase your risk of developing a new blood clot.  Refill your prescription before you run out.  After discharge, you should have regular check-up appointments with your healthcare provider that is prescribing your Eliquis.    What do you do if you miss a dose? If a dose of ELIQUIS is not taken at the scheduled time, take it as soon as possible on the same day and twice-daily administration should be resumed. The dose should not be doubled to make up for a missed dose.  Important Safety Information A possible side effect of Eliquis is bleeding. You should call your healthcare provider right away if you experience any of the following: ? Bleeding from an injury or your nose that does not stop. ? Unusual colored urine (red or dark brown) or unusual colored stools (red or black). ? Unusual bruising for unknown reasons. ? A serious fall or if you hit your head (even if there is no  bleeding).  Some medicines may interact with Eliquis and might increase your risk of bleeding or clotting while on Eliquis. To help avoid this, consult your healthcare provider or pharmacist prior to using any new prescription or non-prescription medications, including herbals, vitamins, non-steroidal anti-inflammatory drugs (NSAIDs) and supplements.  This website has more information on Eliquis (apixaban): http://www.eliquis.com/eliquis/home

## 2014-06-01 NOTE — Progress Notes (Signed)
[facility] PROGRESS NOTE Interim History: 64 y.o. F brought to [facility] ED 5/24 with severe SOB. Found to have PE with CT evidence of RHS. PCCM called for admission.    Assessment/Plan: Pulmonary emboli leading to right heart enlargement: - CTA of the chest was done that showed a saddle PE with evidence of right heart strain. - PCCM was consulted he was given thrombolytics. He is now satting greater than 90% on 5 L. - started on heparin now transition to [employer] ext doppler showed no DVT Consult PT.  ECHO showed a depressed EF compared to previous EF 40 increase coreg & resume ARB.  Thrombocytopenia: Most likely consumption coagulopathy due to PE, now resolved. PLT's cont to improve. Hypercoagulable panel pending.  Acute on Chronic renal disease stage III: - Improvement IV hydration after lytics. - Most likely due to mild shock.   Leukocytosis: Has remained afebrile most likely stress emargination. - improving.  Controlled diabetes mellitus type 2: Lives diet was started on low-dose Levemir  Code Status: full Family Communication: none  Disposition Plan: inpatient   Consultants:  PCCM  Procedures:  CT angio chest  Antibiotics:  None  HPI/Subjective: No complains, except for SOB with ambulation.  Objective: Filed Vitals:   05/31/14 1958 05/31/14 2124 06/01/14 0405 06/01/14 0919  BP:  158/95 156/81   Pulse:  101 89   Temp:  97.8 F (36.6 C) 98 F (36.7 C)   TempSrc:  Oral Oral   Resp:  20 18   Height:      Weight:   148.553 kg (327 lb 8 oz)   SpO2: 96% 99% 98% 98%    Intake/Output Summary (Last 24 hours) at 06/01/14 1049 Last data filed at 05/31/14 2124  Gross per 24 hour  Intake      0 ml  Output   1750 ml  Net  -1750 ml   Filed Weights   05/30/14 0437 05/31/14 0359 06/01/14 0405  Weight: 138.347 kg (305 lb) 135.626 kg (299 lb) 148.553 kg (327 lb 8 oz)    Exam:  General: Alert, awake, oriented x3, in no acute distress.    HEENT: No bruits, no goiter.  Heart: Regular rate and rhythm. Lungs: Good air movement, clear Abdomen: Soft, nontender, nondistended, positive bowel sounds.  Neuro: Grossly intact, nonfocal.   Data Reviewed: Basic Metabolic Panel:  Recent Labs Lab 05/28/14 0350  05/29/14 0230 05/29/14 0430 05/29/14 1947 05/30/14 0404 05/30/14 1936  NA 136  < > 135 135 134* 136 135  K 5.3*  < > 5.2* 5.0 5.5* 5.0 5.0  CL 107  < > 108 107 105 105 106  CO2 18*  < > 17* 17* 15* 19* 20*  GLUCOSE 160*  < > 84 78 143* 109* 181*  BUN 33*  < > 43* 44* 37* 31* 22*  CREATININE 2.07*  < > 1.96* 1.90* 1.42* 1.25* 1.09*  CALCIUM 8.3*  < > 8.5* 8.4* 8.8* 9.0 8.7*  MG 2.0  --   --   --   --   --   --   PHOS 5.4*  --   --   --   --   --   --   < > = values in this interval not displayed. Liver Function Tests:  Recent Labs Lab 05/30/14 0404  AST 326*  ALT 621*  ALKPHOS 312*  BILITOT 0.7  PROT 6.9  ALBUMIN 3.1*   No results for input(s): LIPASE, AMYLASE in the last 168  hours. No results for input(s): AMMONIA in the last 168 hours. CBC:  Recent Labs Lab 05/29/14 0135 05/29/14 0430 05/30/14 0404 05/31/14 0535 06/01/14 0441  WBC 13.3* 12.6* 12.7* 12.9* 11.7*  NEUTROABS  --   --  10.1*  --   --   HGB 10.5* 9.8* 10.5* 9.4* 9.3*  HCT 33.3* 31.6* 33.0* 30.1* 29.0*  MCV 74.3* 74.2* 74.7* 74.0* 74.2*  PLT 82* 95* 111* 144* 165   Cardiac Enzymes:  Recent Labs Lab 05/27/14 1745 05/27/14 2230 05/28/14 0350  TROPONINI 0.06* 0.14* 0.37*   BNP (last 3 results)  Recent Labs  05/27/14 1139  BNP 969.9*    ProBNP (last 3 results) No results for input(s): PROBNP in the last 8760 hours.  CBG:  Recent Labs Lab 05/31/14 1125 05/31/14 2112 05/31/14 2349 06/01/14 0407 06/01/14 0812  GLUCAP 132* 160* 155* 149* 143*    Recent Results (from the past 240 hour(s))  MRSA PCR Screening     Status: None   Collection Time: 05/27/14 10:20 PM  Result Value Ref Range Status   MRSA by PCR  NEGATIVE NEGATIVE Final    Comment:        The GeneXpert MRSA Assay (FDA approved for NASAL specimens only), is one component of a comprehensive MRSA colonization surveillance program. It is not intended to diagnose MRSA infection nor to guide or monitor treatment for MRSA infections.      Studies: No results found.  Scheduled Meds: . amLODipine  10 mg Oral Daily  . apixaban  10 mg Oral BID  . [START ON 06/07/2014] apixaban  5 mg Oral BID  . arformoterol  15 mcg Nebulization BID  . budesonide  0.5 mg Nebulization BID  . buPROPion  100 mg Oral BID  . carvedilol  3.125 mg Oral BID WC  . hydrochlorothiazide  25 mg Oral Daily  . insulin aspart  0-20 Units Subcutaneous 6 times per day  . insulin detemir  5 Units Subcutaneous Daily  . pantoprazole  40 mg Oral Daily  . sodium chloride  3 mL Intravenous Q12H   Continuous Infusions:    Time Spent: 25 min   Marinda ElkFELIZ ORTIZ, Jaylina Ramdass  [facility] Pager [phone]. If 7PM-7AM, please contact night-coverage at [email or web address], password [email or web address] 06/01/2014, 10:49 AM  LOS: 5 days

## 2014-06-01 NOTE — Evaluation (Signed)
Physical Therapy Evaluation Patient Details Name: Julia PerchesGeraldine E Flores MRN: [id number] DOB: Sep 24, 1950 Today's Date: 06/01/2014   History of Present Illness  64 y.o. F [facility] with severe SOB. Found to have PE with CT evidence of Right Heart Strain  Past Medical History  Diagnosis Date  . Osteoarthritis   . Hypertension   . Morbid obesity   . Diabetes mellitus without complication    Past Surgical History  Procedure Laterality Date  . Cesarean section      x 3     Clinical Impression  Pt admitted with above diagnosis. Pt currently with functional limitations due to the deficits listed below (see PT Problem List). Pt's O2 sat response to activity was much better tahn anticipated; RN notified;  Pt will benefit from skilled PT to increase their independence and safety with mobility to allow discharge to the venue listed below.   Quite dyspneic with exertion, and will benefit from SNF for rehab to maximize independence and safety with mobility as she must be independent to dc home; IF she progresses quickly, we can consider dc home; will monitor and update as necessary      Follow Up Recommendations SNF (short term)    Equipment Recommendations  Rolling walker with 5" wheels;3in1 (PT)    Recommendations for Other Services OT consult (energy conservation)     Precautions / Restrictions Precautions Precaution Comments: DOE      Mobility  Bed Mobility                  Transfers Overall transfer level: Needs assistance Equipment used: Rolling walker (2 wheeled) Transfers: Sit to/from Stand Sit to Stand: Min guard         General transfer comment: Minguard assist for safety  Ambulation/Gait Ambulation/Gait assistance: Min guard (without physical contact) Ambulation Distance (Feet): 80 Feet Assistive device: Rolling walker (2 wheeled) Gait Pattern/deviations: Step-through pattern;Decreased stride length     General Gait Details: very dyspneic  with exertion; Cues to self-monitor for activity tolerance  Stairs            Wheelchair Mobility    Modified Rankin (Stroke Patients Only)       Balance Overall balance assessment:  (Noted reliance on UE support)                                           Pertinent Vitals/Pain Pain Assessment: No/denies pain    Home Living Family/patient expects to be discharged to:: Private residence Living Arrangements: Children Available Help at Discharge: Family;Available PRN/intermittently Type of Home: House Home Access: Stairs to enter   Entrance Stairs-Number of Steps: 3 Home Layout: Two level;1/2 bath on main level;Bed/bath upstairs Home Equipment: None      Prior Function Level of Independence: Independent               Hand Dominance        Extremity/Trunk Assessment   Upper Extremity Assessment: Overall WFL for tasks assessed           Lower Extremity Assessment: Generalized weakness         Communication   Communication: No difficulties  Cognition Arousal/Alertness: Awake/alert Behavior During Therapy: WFL for tasks assessed/performed Overall Cognitive Status: Within Functional Limits for tasks assessed  General Comments General comments (skin integrity, edema, etc.): Walked on Room Air and noted O2 sats remained greater than or equal to 90%    Exercises        Assessment/Plan    PT Assessment Patient needs continued PT services  PT Diagnosis Difficulty walking;Generalized weakness   PT Problem List Decreased strength;Decreased activity tolerance;Decreased balance;Decreased mobility;Decreased knowledge of use of DME;Cardiopulmonary status limiting activity;Decreased knowledge of precautions  PT Treatment Interventions DME instruction;Gait training;Stair training;Functional mobility training;Therapeutic activities;Therapeutic exercise;Patient/family education   PT Goals (Current goals can be  found in the Care Plan section) Acute Rehab PT Goals Patient Stated Goal: wants to be confident going home PT Goal Formulation: With patient Time For Goal Achievement: 06/15/14 Potential to Achieve Goals: Good    Frequency Min 3X/week   Barriers to discharge Decreased caregiver support;Inaccessible home environment Flight of steps to reach bedroom/bathroom    Co-evaluation               End of Session Equipment Utilized During Treatment: Other (comment) (Pulse Oximeter) Activity Tolerance: Patient limited by fatigue Patient left: in bed;with call bell/phone within reach (sitting EOB) Nurse Communication: Mobility status         Time: 1191-4782 PT Time Calculation (min) (ACUTE ONLY): 29 min   Charges:   PT Evaluation $Initial PT Evaluation Tier I: 1 Procedure PT Treatments $Gait Training: 8-22 mins   PT G CodesOlen Pel 06/01/2014, 4:50 PM Van Clines, [place]  Acute Rehabilitation Services Pager [phone] Office [phone]

## 2014-06-02 DIAGNOSIS — N179 Acute kidney failure, unspecified: Secondary | ICD-10-CM

## 2014-06-02 LAB — CBC
HCT: 30.2 % — ABNORMAL LOW (ref 36.0–46.0)
HEMOGLOBIN: 9.5 g/dL — AB (ref 12.0–15.0)
MCH: 23.3 pg — AB (ref 26.0–34.0)
MCHC: 31.5 g/dL (ref 30.0–36.0)
MCV: 74 fL — ABNORMAL LOW (ref 78.0–100.0)
PLATELETS: 199 10*3/uL (ref 150–400)
RBC: 4.08 MIL/uL (ref 3.87–5.11)
RDW: 20.6 % — AB (ref 11.5–15.5)
WBC: 11.3 10*3/uL — ABNORMAL HIGH (ref 4.0–10.5)

## 2014-06-02 LAB — GLUCOSE, CAPILLARY
GLUCOSE-CAPILLARY: 140 mg/dL — AB (ref 65–99)
Glucose-Capillary: 138 mg/dL — ABNORMAL HIGH (ref 65–99)
Glucose-Capillary: 172 mg/dL — ABNORMAL HIGH (ref 65–99)
Glucose-Capillary: 193 mg/dL — ABNORMAL HIGH (ref 65–99)

## 2014-06-02 MED ORDER — ALPRAZOLAM 0.25 MG PO TABS
0.2500 mg | ORAL_TABLET | Freq: Two times a day (BID) | ORAL | Status: DC | PRN
Start: 2014-06-02 — End: 2014-07-24

## 2014-06-02 MED ORDER — BUPROPION HCL 100 MG PO TABS: 100.0000 mg | ORAL_TABLET | Freq: Two times a day (BID) | ORAL | Status: DC

## 2014-06-02 MED ORDER — APIXABAN 5 MG PO TABS: 10.0000 mg | ORAL_TABLET | Freq: Two times a day (BID) | ORAL | Status: DC

## 2014-06-02 MED ORDER — BUDESONIDE 0.5 MG/2ML IN SUSP
0.5000 mg | Freq: Two times a day (BID) | RESPIRATORY_TRACT | Status: DC
  Administered 2014-06-02: 0.5 mg via RESPIRATORY_TRACT
  Filled 2014-06-02 (×3): qty 2

## 2014-06-02 MED ORDER — APIXABAN 5 MG PO TABS: 5.0000 mg | ORAL_TABLET | Freq: Two times a day (BID) | ORAL | Status: DC

## 2014-06-02 MED ORDER — CARVEDILOL 12.5 MG PO TABS
12.5000 mg | ORAL_TABLET | Freq: Two times a day (BID) | ORAL | Status: DC
  Filled 2014-06-02 (×2): qty 1

## 2014-06-02 MED ORDER — IRBESARTAN 150 MG PO TABS: 150.0000 mg | ORAL_TABLET | Freq: Every day | ORAL | Status: DC

## 2014-06-02 MED ORDER — CARVEDILOL 12.5 MG PO TABS: 12.5000 mg | ORAL_TABLET | Freq: Two times a day (BID) | ORAL | Status: DC

## 2014-06-02 NOTE — Progress Notes (Signed)
Pt picked up by [facility] to be transported to disposition. Pt transported off unit via stretcher with belongings to the side. P. Amo Tiersa Dayley RN. 

## 2014-06-02 NOTE — Progress Notes (Signed)
Pt discharge education completed; [facility] and report called off to [place] a nurse at the facility. Pt telemetry remove; and pt had not IV access; pt in room comfortably awaiting on [facility] for transportation to disposition. Will closely monitor till pick up. P. Amo Tahliyah Anagnos RN.

## 2014-06-02 NOTE — Discharge Summary (Addendum)
Physician Discharge Summary  [name]:[id number] DOB: November 14, 1950 DOA: 05/27/2014  PCP: Frederich Chick, MD  Admit date: 05/27/2014 Discharge date: 06/02/2014  Time spent: 35 minutes  Recommendations for Outpatient Follow-up:  1. Follow-up of pulmonary 2 weeks. 2. With a skilled nursing facility we will need oxygen for the next few weeks. 3. Continue up Apixiban 10 mg by mouth twice a day for 12 days, then continue 5 mg by mouth twice a day.  Discharge Diagnoses:  Active Problems:   PE (pulmonary embolism)   Pulmonary embolism   Pulmonary emboli   Right heart enlargement   Discharge Condition: stable  Diet recommendation: Carb modified  Filed Weights   05/31/14 0359 06/01/14 0405 06/02/14 0336  Weight: 135.626 kg (299 lb) 148.553 kg (327 lb 8 oz) 146.3 kg (322 lb 8.5 oz)    History of present illness:   64 y.o. F brought to [facility] ED 5/24 with severe SOB. Found to have PE with CT evidence of RHS. PCCM called for admission.   Hospital Course:  Pulmonary emboli leading to right heart enlargement: - CTA of the chest was done that showed a saddle PE with evidence of right heart strain. - PCCM was consulted he was given thrombolytics.  - started on IV heparin on admission was her shortness of breath improved she was  transition to [employer] ext doppler showed 5.26.2016: acute deep vein thrombosis involving the left popliteal vein. Consulted PT rec SNF. ECHO showed a depressed EF compared to previous EF 40 increase coreg & resume ARB. - Will need oxygen as an outpatient.  Thrombocytopenia: Most likely consumption coagulopathy due to PE, now resolved. Hypercoagulable panel showed no abnormalitties  Acute on Chronic renal disease stage III: - Improvement IV hydration after lytics. - Most likely due to mild shock.   Leukocytosis: Has remained afebrile most likely stress emargination. Resolved  Controlled diabetes mellitus type 2: No changes made to her  medication   Procedures:  CT imaging of the chest showed bilateral spinal PE.  Consultations:  PCCM  Discharge Exam: Filed Vitals:   06/02/14 0827  BP: 147/87  Pulse: 87  Temp: 97.7 F (36.5 C)  Resp: 18    General: A&O x3 Cardiovascular: RRR Respiratory: good air movement CTA B/L  Discharge Instructions   Discharge Instructions    Diet - low sodium heart healthy    Complete by:  As directed      Increase activity slowly    Complete by:  As directed           Current Discharge Medication List    START taking these medications   Details  !! apixaban (ELIQUIS) 5 MG TABS tablet Take 2 tablets (10 mg total) by mouth 2 (two) times daily. Qty: 24 tablet, Refills: 0    !! apixaban (ELIQUIS) 5 MG TABS tablet Take 1 tablet (5 mg total) by mouth 2 (two) times daily. Qty: 60 tablet, Refills: 3    buPROPion (WELLBUTRIN) 100 MG tablet Take 1 tablet (100 mg total) by mouth 2 (two) times daily.    carvedilol (COREG) 12.5 MG tablet Take 1 tablet (12.5 mg total) by mouth 2 (two) times daily with a meal. Qty: 6 tablet, Refills: 0    irbesartan (AVAPRO) 150 MG tablet Take 1 tablet (150 mg total) by mouth daily. Qty: 30 tablet, Refills: 0     !! - Potential duplicate medications found. Please discuss with provider.    CONTINUE these medications which have NOT CHANGED  Details  albuterol (PROVENTIL HFA;VENTOLIN HFA) 108 (90 BASE) MCG/ACT inhaler Inhale 1-2 puffs into the lungs every 6 (six) hours as needed for wheezing. Qty: 1 Inhaler, Refills: 0    ALPRAZolam (XANAX) 0.25 MG tablet Take 0.25 mg by mouth 2 (two) times daily as needed for anxiety.    aspirin EC 81 MG EC tablet Take 1 tablet (81 mg total) by mouth daily. Qty: 30 tablet, Refills: 0    BIOTIN PO Take 500 mcg by mouth daily.    glimepiride (AMARYL) 4 MG tablet Take 4 mg by mouth daily with breakfast.    hydrochlorothiazide (HYDRODIURIL) 25 MG tablet Take 25 mg by mouth daily.    lisinopril  (PRINIVIL,ZESTRIL) 40 MG tablet Take 40 mg by mouth daily.    metFORMIN (GLUCOPHAGE) 500 MG tablet Take 1 tablet (500 mg total) by mouth 2 (two) times daily with a meal. Qty: 60 tablet, Refills: 0    mometasone-formoterol (DULERA) 100-5 MCG/ACT AERO Inhale 2 puffs into the lungs 2 (two) times daily.    Multiple Vitamins-Minerals (MULTIVITAMIN WITH MINERALS) tablet Take 1 tablet by mouth daily.    pantoprazole (PROTONIX) 40 MG tablet Take 40 mg by mouth daily as needed.       STOP taking these medications     amLODipine (NORVASC) 10 MG tablet      amLODipine-olmesartan (AZOR) 5-20 MG per tablet        No Known Allergies Follow-up Information    Follow up with [facility] In 2 weeks.   Specialty:  Pulmonology   Why:  hospital follow up   Contact information:   [address] [facility] [address] [phone]       The results of significant diagnostics from this hospitalization (including imaging, microbiology, ancillary and laboratory) are listed below for reference.    Significant Diagnostic Studies: Ct Angio Chest Pe W/cm &/or Wo Cm  05/27/2014   CLINICAL DATA:  Shortness of breath for several days  EXAM: CT ANGIOGRAPHY CHEST WITH CONTRAST  TECHNIQUE: Multidetector CT imaging of the chest was performed using the standard protocol during bolus administration of intravenous contrast. Multiplanar CT image reconstructions and MIPs were obtained to evaluate the vascular anatomy.  CONTRAST:  OMNIPAQUE IOHEXOL 350 MG/ML SOLN  COMPARISON:  None.  FINDINGS: The lungs are well aerated bilaterally. No focal infiltrate or sizable effusion is seen.  The thoracic inlet is within normal limits. Mild calcifications of the thoracic aorta are noted without aneurysmal dilatation. Pulmonary artery demonstrates significant pulmonary emboli as well as a focal saddle embolus traversing the bifurcation of the main pulmonary artery. Evidence of right heart strain is noted  with dilatation of right ventricle.  No hilar or mediastinal adenopathy is noted. The visualized upper abdomen is within normal limits. No acute bony abnormality is noted. Degenerative changes of thoracic spine are seen.  Review of the MIP images confirms the above findings.  IMPRESSION: Positive for acute PE with CT evidence of right heart strain (RV/LV Ratio = 1.12) consistent with at least submassive (intermediate risk) PE. The presence of right heart strain has been associated with an increased risk of morbidity and mortality. Please activate Code PE by paging [phone].  Critical Value/emergent results were called by telephone at the time of interpretation on 05/27/2014 at 3:16 pm to Dr. Purvis Sheffield , who verbally acknowledged these results.   Electronically Signed   By: Alcide Clever M.D.   On: 05/27/2014 15:19   Ir Angiogram Pulmonary Bilateral  Selective  05/28/2014   CLINICAL DATA:  64 year old female with acute sub massive bordering on massive (borderline hypotension) large volume PE and CT evidence of right heart strain with significant respiratory distress and difficulty in maintaining oxygenation. Urgent ultrasound accelerated thrombolysis is warranted to facilitate unloading of the pulmonary arterial pressure and right heart strain.  EXAM: BILATERAL PULMONARY ARTERIOGRAPHY; ADDITIONAL ARTERIOGRAPHY; IR ULTRASOUND GUIDANCE VASC ACCESS RIGHT; IR INFUSION THROMBOL VENOUS INITIAL (MS)  Date: 05/28/2014  PROCEDURE: 1. Ultrasound guided right common femoral venous access 2. Second ultrasound-guided right common femoral venous access 3. Main pulmonary artery pressure measurement 4. Left pulmonary arteriogram 5. Right pulmonary arteriogram 6. EKOS catheter placement in the right lower lobe pulmonary artery 7. EKOS catheter placement in the left lower lobe pulmonary artery 8. Initiation of bilateral pulmonary arterial thrombolysis Interventional Radiologist:  Sterling Big, MD  ANESTHESIA/SEDATION:  Moderate (conscious) sedation was used. 0.5 mg Versed, 25 mcg Fentanyl were administered intravenously. The patient's vital signs were monitored continuously by radiology nursing throughout the procedure.  Sedation Time: 30 minutes  MEDICATIONS: TPA begun at 0.5 mg/hr in each catheter for a total of 1 mg/hr  FLUOROSCOPY TIME:  11 minutes 12 seconds  609 mGy  CONTRAST:  30mL OMNIPAQUE IOHEXOL 300 MG/ML  SOLN  TECHNIQUE: Informed consent was obtained from the patient following explanation of the procedure, risks, benefits and alternatives. The patient understands, agrees and consents for the procedure. All questions were addressed. A time out was performed.  Maximal barrier sterile technique utilized including caps, mask, sterile gowns, sterile gloves, large sterile drape, hand hygiene, and Betadine skin prep.  The right groin was interrogated with ultrasound. The common femoral vein is distended and compressible. There is no evidence of thrombus. An image was obtained and stored for the medical record. Local anesthesia was attained by infiltration with 1% lidocaine. Two small dermatotomy server made. Using ultrasound guidance, the vessel was punctured with a 21 gauge micropuncture needle. With the assistance of a micro wire, a 5 [place] transitional sheath was advanced into the vessel.  A second common femoral access was then obtained again using real-time sonographic guidance and a 21 gauge micropuncture needle and transitional 5 French micro sheath system. Both micro sheaths were then exchanged sequentially over 035 wires for working 6 [place] vascular sheaths.  A 5 French vert catheter was then navigated into the main pulmonary artery over a Bentson wire. Main pulmonary arterial pressures were obtained. The pulmonary arterial pressure was 74/39 for a mean of 50 mmHg. The 5 French catheter was next used to select the left main pulmonary artery. A limited left pulmonary arteriogram was performed. There is significant  clot burden in the branches to the lower lobe. The catheter was navigated into the distal left lower lobe pulmonary artery. A Rosen wire was advanced into the distal left lower lobe pulmonary artery in the 5 French catheter removed. The 5 French catheter was then inserted into the second 6 [place] vascular sheath an again navigated into the main pulmonary artery over a Bentson wire. This time, the catheter was used to select the right main pulmonary artery. A limited right pulmonary arteriogram was performed demonstrating occlusive clot in the right lower lobe pulmonary artery confirming the findings of the recent CT scan. There is very poor perfusion of the right lung.  The catheter was advanced over the Bentson wire into the distal right lower lobe pulmonary artery. The Bentson wire was exchanged for a Rosen wire in the 5 [place] catheter  removed. An 18 cm EKOS infusion catheter was then advanced over the wire and positioned in the right lower lobe pulmonary artery. The proximal aspect of the catheter was allowed to come back into the main pulmonary trunk to facilitate dating of the saddle embolus.  A 12 cm EKOS infusion catheter was then advanced over the second wire and positioned in the left lower lobe pulmonary artery.  The sheaths were secured in place with sterile bandages. Pulmonary arterial lysis was then initiated per standard protocol.  COMPLICATIONS: None  IMPRESSION: 1. Main pulmonary arterial pressure was 74/39 for a mean of 50 mmHg consistent with critical pulmonary arterial hypertension. 2. Bilateral pulmonary arteriography demonstrates large volume occlusive clot in the central right lower lobe pulmonary artery, and high burden of subocclusive caught throughout the left lower extremity pulmonary artery. 3. Initiation of bilateral ultrasound accelerated thrombolysis with EKOS catheters per code PE protocol.  Signed,  Sterling Big, MD  [facility]   [facility]   Electronically Signed   By: Malachy Moan M.D.   On: 05/28/2014 09:08   Ir Angiogram Selective Each Additional Vessel  05/28/2014   CLINICAL DATA:  64 year old female with acute sub massive bordering on massive (borderline hypotension) large volume PE and CT evidence of right heart strain with significant respiratory distress and difficulty in maintaining oxygenation. Urgent ultrasound accelerated thrombolysis is warranted to facilitate unloading of the pulmonary arterial pressure and right heart strain.  EXAM: BILATERAL PULMONARY ARTERIOGRAPHY; ADDITIONAL ARTERIOGRAPHY; IR ULTRASOUND GUIDANCE VASC ACCESS RIGHT; IR INFUSION THROMBOL VENOUS INITIAL (MS)  Date: 05/28/2014  PROCEDURE: 1. Ultrasound guided right common femoral venous access 2. Second ultrasound-guided right common femoral venous access 3. Main pulmonary artery pressure measurement 4. Left pulmonary arteriogram 5. Right pulmonary arteriogram 6. EKOS catheter placement in the right lower lobe pulmonary artery 7. EKOS catheter placement in the left lower lobe pulmonary artery 8. Initiation of bilateral pulmonary arterial thrombolysis Interventional Radiologist:  Sterling Big, MD  ANESTHESIA/SEDATION: Moderate (conscious) sedation was used. 0.5 mg Versed, 25 mcg Fentanyl were administered intravenously. The patient's vital signs were monitored continuously by radiology nursing throughout the procedure.  Sedation Time: 30 minutes  MEDICATIONS: TPA begun at 0.5 mg/hr in each catheter for a total of 1 mg/hr  FLUOROSCOPY TIME:  11 minutes 12 seconds  609 mGy  CONTRAST:  30mL OMNIPAQUE IOHEXOL 300 MG/ML  SOLN  TECHNIQUE: Informed consent was obtained from the patient following explanation of the procedure, risks, benefits and alternatives. The patient understands, agrees and consents for the procedure. All questions were addressed. A time out was performed.  Maximal barrier sterile technique utilized including caps, mask,  sterile gowns, sterile gloves, large sterile drape, hand hygiene, and Betadine skin prep.  The right groin was interrogated with ultrasound. The common femoral vein is distended and compressible. There is no evidence of thrombus. An image was obtained and stored for the medical record. Local anesthesia was attained by infiltration with 1% lidocaine. Two small dermatotomy server made. Using ultrasound guidance, the vessel was punctured with a 21 gauge micropuncture needle. With the assistance of a micro wire, a 5 [place] transitional sheath was advanced into the vessel.  A second common femoral access was then obtained again using real-time sonographic guidance and a 21 gauge micropuncture needle and transitional 5 French micro sheath system. Both micro sheaths were then exchanged sequentially over 035 wires for working 6 [place] vascular sheaths.  A 5 French vert catheter was then navigated into  the main pulmonary artery over a Bentson wire. Main pulmonary arterial pressures were obtained. The pulmonary arterial pressure was 74/39 for a mean of 50 mmHg. The 5 French catheter was next used to select the left main pulmonary artery. A limited left pulmonary arteriogram was performed. There is significant clot burden in the branches to the lower lobe. The catheter was navigated into the distal left lower lobe pulmonary artery. A Rosen wire was advanced into the distal left lower lobe pulmonary artery in the 5 French catheter removed. The 5 French catheter was then inserted into the second 6 [place] vascular sheath an again navigated into the main pulmonary artery over a Bentson wire. This time, the catheter was used to select the right main pulmonary artery. A limited right pulmonary arteriogram was performed demonstrating occlusive clot in the right lower lobe pulmonary artery confirming the findings of the recent CT scan. There is very poor perfusion of the right lung.  The catheter was advanced over the Bentson wire  into the distal right lower lobe pulmonary artery. The Bentson wire was exchanged for a Rosen wire in the 5 [place] catheter removed. An 18 cm EKOS infusion catheter was then advanced over the wire and positioned in the right lower lobe pulmonary artery. The proximal aspect of the catheter was allowed to come back into the main pulmonary trunk to facilitate dating of the saddle embolus.  A 12 cm EKOS infusion catheter was then advanced over the second wire and positioned in the left lower lobe pulmonary artery.  The sheaths were secured in place with sterile bandages. Pulmonary arterial lysis was then initiated per standard protocol.  COMPLICATIONS: None  IMPRESSION: 1. Main pulmonary arterial pressure was 74/39 for a mean of 50 mmHg consistent with critical pulmonary arterial hypertension. 2. Bilateral pulmonary arteriography demonstrates large volume occlusive clot in the central right lower lobe pulmonary artery, and high burden of subocclusive caught throughout the left lower extremity pulmonary artery. 3. Initiation of bilateral ultrasound accelerated thrombolysis with EKOS catheters per code PE protocol.  Signed,  Sterling Big, MD  [facility]  [facility]   Electronically Signed   By: Malachy Moan M.D.   On: 05/28/2014 09:08   Ir Angiogram Selective Each Additional Vessel  05/28/2014   CLINICAL DATA:  64 year old female with acute sub massive bordering on massive (borderline hypotension) large volume PE and CT evidence of right heart strain with significant respiratory distress and difficulty in maintaining oxygenation. Urgent ultrasound accelerated thrombolysis is warranted to facilitate unloading of the pulmonary arterial pressure and right heart strain.  EXAM: BILATERAL PULMONARY ARTERIOGRAPHY; ADDITIONAL ARTERIOGRAPHY; IR ULTRASOUND GUIDANCE VASC ACCESS RIGHT; IR INFUSION THROMBOL VENOUS INITIAL (MS)  Date: 05/28/2014  PROCEDURE: 1. Ultrasound  guided right common femoral venous access 2. Second ultrasound-guided right common femoral venous access 3. Main pulmonary artery pressure measurement 4. Left pulmonary arteriogram 5. Right pulmonary arteriogram 6. EKOS catheter placement in the right lower lobe pulmonary artery 7. EKOS catheter placement in the left lower lobe pulmonary artery 8. Initiation of bilateral pulmonary arterial thrombolysis Interventional Radiologist:  Sterling Big, MD  ANESTHESIA/SEDATION: Moderate (conscious) sedation was used. 0.5 mg Versed, 25 mcg Fentanyl were administered intravenously. The patient's vital signs were monitored continuously by radiology nursing throughout the procedure.  Sedation Time: 30 minutes  MEDICATIONS: TPA begun at 0.5 mg/hr in each catheter for a total of 1 mg/hr  FLUOROSCOPY TIME:  11 minutes 12 seconds  609 mGy  CONTRAST:  30mL OMNIPAQUE IOHEXOL 300 MG/ML  SOLN  TECHNIQUE: Informed consent was obtained from the patient following explanation of the procedure, risks, benefits and alternatives. The patient understands, agrees and consents for the procedure. All questions were addressed. A time out was performed.  Maximal barrier sterile technique utilized including caps, mask, sterile gowns, sterile gloves, large sterile drape, hand hygiene, and Betadine skin prep.  The right groin was interrogated with ultrasound. The common femoral vein is distended and compressible. There is no evidence of thrombus. An image was obtained and stored for the medical record. Local anesthesia was attained by infiltration with 1% lidocaine. Two small dermatotomy server made. Using ultrasound guidance, the vessel was punctured with a 21 gauge micropuncture needle. With the assistance of a micro wire, a 5 [place] transitional sheath was advanced into the vessel.  A second common femoral access was then obtained again using real-time sonographic guidance and a 21 gauge micropuncture needle and transitional 5 French micro  sheath system. Both micro sheaths were then exchanged sequentially over 035 wires for working 6 [place] vascular sheaths.  A 5 French vert catheter was then navigated into the main pulmonary artery over a Bentson wire. Main pulmonary arterial pressures were obtained. The pulmonary arterial pressure was 74/39 for a mean of 50 mmHg. The 5 French catheter was next used to select the left main pulmonary artery. A limited left pulmonary arteriogram was performed. There is significant clot burden in the branches to the lower lobe. The catheter was navigated into the distal left lower lobe pulmonary artery. A Rosen wire was advanced into the distal left lower lobe pulmonary artery in the 5 French catheter removed. The 5 French catheter was then inserted into the second 6 [place] vascular sheath an again navigated into the main pulmonary artery over a Bentson wire. This time, the catheter was used to select the right main pulmonary artery. A limited right pulmonary arteriogram was performed demonstrating occlusive clot in the right lower lobe pulmonary artery confirming the findings of the recent CT scan. There is very poor perfusion of the right lung.  The catheter was advanced over the Bentson wire into the distal right lower lobe pulmonary artery. The Bentson wire was exchanged for a Rosen wire in the 5 [place] catheter removed. An 18 cm EKOS infusion catheter was then advanced over the wire and positioned in the right lower lobe pulmonary artery. The proximal aspect of the catheter was allowed to come back into the main pulmonary trunk to facilitate dating of the saddle embolus.  A 12 cm EKOS infusion catheter was then advanced over the second wire and positioned in the left lower lobe pulmonary artery.  The sheaths were secured in place with sterile bandages. Pulmonary arterial lysis was then initiated per standard protocol.  COMPLICATIONS: None  IMPRESSION: 1. Main pulmonary arterial pressure was 74/39 for a mean of 50  mmHg consistent with critical pulmonary arterial hypertension. 2. Bilateral pulmonary arteriography demonstrates large volume occlusive clot in the central right lower lobe pulmonary artery, and high burden of subocclusive caught throughout the left lower extremity pulmonary artery. 3. Initiation of bilateral ultrasound accelerated thrombolysis with EKOS catheters per code PE protocol.  Signed,  Sterling Big, MD  [facility]  [facility]   Electronically Signed   By: Malachy Moan M.D.   On: 05/28/2014 09:08   Ir US Guide Vasc Access Right  05/28/2014   CLINICAL DATA:  64 year old female with acute sub massive bordering on  massive (borderline hypotension) large volume PE and CT evidence of right heart strain with significant respiratory distress and difficulty in maintaining oxygenation. Urgent ultrasound accelerated thrombolysis is warranted to facilitate unloading of the pulmonary arterial pressure and right heart strain.  EXAM: BILATERAL PULMONARY ARTERIOGRAPHY; ADDITIONAL ARTERIOGRAPHY; IR ULTRASOUND GUIDANCE VASC ACCESS RIGHT; IR INFUSION THROMBOL VENOUS INITIAL (MS)  Date: 05/28/2014  PROCEDURE: 1. Ultrasound guided right common femoral venous access 2. Second ultrasound-guided right common femoral venous access 3. Main pulmonary artery pressure measurement 4. Left pulmonary arteriogram 5. Right pulmonary arteriogram 6. EKOS catheter placement in the right lower lobe pulmonary artery 7. EKOS catheter placement in the left lower lobe pulmonary artery 8. Initiation of bilateral pulmonary arterial thrombolysis Interventional Radiologist:  Sterling Big, MD  ANESTHESIA/SEDATION: Moderate (conscious) sedation was used. 0.5 mg Versed, 25 mcg Fentanyl were administered intravenously. The patient's vital signs were monitored continuously by radiology nursing throughout the procedure.  Sedation Time: 30 minutes  MEDICATIONS: TPA begun at 0.5 mg/hr in  each catheter for a total of 1 mg/hr  FLUOROSCOPY TIME:  11 minutes 12 seconds  609 mGy  CONTRAST:  30mL OMNIPAQUE IOHEXOL 300 MG/ML  SOLN  TECHNIQUE: Informed consent was obtained from the patient following explanation of the procedure, risks, benefits and alternatives. The patient understands, agrees and consents for the procedure. All questions were addressed. A time out was performed.  Maximal barrier sterile technique utilized including caps, mask, sterile gowns, sterile gloves, large sterile drape, hand hygiene, and Betadine skin prep.  The right groin was interrogated with ultrasound. The common femoral vein is distended and compressible. There is no evidence of thrombus. An image was obtained and stored for the medical record. Local anesthesia was attained by infiltration with 1% lidocaine. Two small dermatotomy server made. Using ultrasound guidance, the vessel was punctured with a 21 gauge micropuncture needle. With the assistance of a micro wire, a 5 [place] transitional sheath was advanced into the vessel.  A second common femoral access was then obtained again using real-time sonographic guidance and a 21 gauge micropuncture needle and transitional 5 French micro sheath system. Both micro sheaths were then exchanged sequentially over 035 wires for working 6 [place] vascular sheaths.  A 5 French vert catheter was then navigated into the main pulmonary artery over a Bentson wire. Main pulmonary arterial pressures were obtained. The pulmonary arterial pressure was 74/39 for a mean of 50 mmHg. The 5 French catheter was next used to select the left main pulmonary artery. A limited left pulmonary arteriogram was performed. There is significant clot burden in the branches to the lower lobe. The catheter was navigated into the distal left lower lobe pulmonary artery. A Rosen wire was advanced into the distal left lower lobe pulmonary artery in the 5 French catheter removed. The 5 French catheter was then inserted  into the second 6 [place] vascular sheath an again navigated into the main pulmonary artery over a Bentson wire. This time, the catheter was used to select the right main pulmonary artery. A limited right pulmonary arteriogram was performed demonstrating occlusive clot in the right lower lobe pulmonary artery confirming the findings of the recent CT scan. There is very poor perfusion of the right lung.  The catheter was advanced over the Bentson wire into the distal right lower lobe pulmonary artery. The Bentson wire was exchanged for a Rosen wire in the 5 [place] catheter removed. An 18 cm EKOS infusion catheter was then advanced over the wire and positioned in  the right lower lobe pulmonary artery. The proximal aspect of the catheter was allowed to come back into the main pulmonary trunk to facilitate dating of the saddle embolus.  A 12 cm EKOS infusion catheter was then advanced over the second wire and positioned in the left lower lobe pulmonary artery.  The sheaths were secured in place with sterile bandages. Pulmonary arterial lysis was then initiated per standard protocol.  COMPLICATIONS: None  IMPRESSION: 1. Main pulmonary arterial pressure was 74/39 for a mean of 50 mmHg consistent with critical pulmonary arterial hypertension. 2. Bilateral pulmonary arteriography demonstrates large volume occlusive clot in the central right lower lobe pulmonary artery, and high burden of subocclusive caught throughout the left lower extremity pulmonary artery. 3. Initiation of bilateral ultrasound accelerated thrombolysis with EKOS catheters per code PE protocol.  Signed,  Sterling Big, MD  [facility]  [facility]   Electronically Signed   By: Malachy Moan M.D.   On: 05/28/2014 09:08   Ir US Guide Vasc Access Right  05/28/2014   CLINICAL DATA:  64 year old female with acute sub massive bordering on massive (borderline hypotension) large volume PE and CT evidence  of right heart strain with significant respiratory distress and difficulty in maintaining oxygenation. Urgent ultrasound accelerated thrombolysis is warranted to facilitate unloading of the pulmonary arterial pressure and right heart strain.  EXAM: BILATERAL PULMONARY ARTERIOGRAPHY; ADDITIONAL ARTERIOGRAPHY; IR ULTRASOUND GUIDANCE VASC ACCESS RIGHT; IR INFUSION THROMBOL VENOUS INITIAL (MS)  Date: 05/28/2014  PROCEDURE: 1. Ultrasound guided right common femoral venous access 2. Second ultrasound-guided right common femoral venous access 3. Main pulmonary artery pressure measurement 4. Left pulmonary arteriogram 5. Right pulmonary arteriogram 6. EKOS catheter placement in the right lower lobe pulmonary artery 7. EKOS catheter placement in the left lower lobe pulmonary artery 8. Initiation of bilateral pulmonary arterial thrombolysis Interventional Radiologist:  Sterling Big, MD  ANESTHESIA/SEDATION: Moderate (conscious) sedation was used. 0.5 mg Versed, 25 mcg Fentanyl were administered intravenously. The patient's vital signs were monitored continuously by radiology nursing throughout the procedure.  Sedation Time: 30 minutes  MEDICATIONS: TPA begun at 0.5 mg/hr in each catheter for a total of 1 mg/hr  FLUOROSCOPY TIME:  11 minutes 12 seconds  609 mGy  CONTRAST:  30mL OMNIPAQUE IOHEXOL 300 MG/ML  SOLN  TECHNIQUE: Informed consent was obtained from the patient following explanation of the procedure, risks, benefits and alternatives. The patient understands, agrees and consents for the procedure. All questions were addressed. A time out was performed.  Maximal barrier sterile technique utilized including caps, mask, sterile gowns, sterile gloves, large sterile drape, hand hygiene, and Betadine skin prep.  The right groin was interrogated with ultrasound. The common femoral vein is distended and compressible. There is no evidence of thrombus. An image was obtained and stored for the medical record. Local  anesthesia was attained by infiltration with 1% lidocaine. Two small dermatotomy server made. Using ultrasound guidance, the vessel was punctured with a 21 gauge micropuncture needle. With the assistance of a micro wire, a 5 [place] transitional sheath was advanced into the vessel.  A second common femoral access was then obtained again using real-time sonographic guidance and a 21 gauge micropuncture needle and transitional 5 French micro sheath system. Both micro sheaths were then exchanged sequentially over 035 wires for working 6 [place] vascular sheaths.  A 5 French vert catheter was then navigated into the main pulmonary artery over a Bentson wire. Main pulmonary arterial pressures were obtained. The pulmonary  arterial pressure was 74/39 for a mean of 50 mmHg. The 5 French catheter was next used to select the left main pulmonary artery. A limited left pulmonary arteriogram was performed. There is significant clot burden in the branches to the lower lobe. The catheter was navigated into the distal left lower lobe pulmonary artery. A Rosen wire was advanced into the distal left lower lobe pulmonary artery in the 5 French catheter removed. The 5 French catheter was then inserted into the second 6 [place] vascular sheath an again navigated into the main pulmonary artery over a Bentson wire. This time, the catheter was used to select the right main pulmonary artery. A limited right pulmonary arteriogram was performed demonstrating occlusive clot in the right lower lobe pulmonary artery confirming the findings of the recent CT scan. There is very poor perfusion of the right lung.  The catheter was advanced over the Bentson wire into the distal right lower lobe pulmonary artery. The Bentson wire was exchanged for a Rosen wire in the 5 [place] catheter removed. An 18 cm EKOS infusion catheter was then advanced over the wire and positioned in the right lower lobe pulmonary artery. The proximal aspect of the catheter was  allowed to come back into the main pulmonary trunk to facilitate dating of the saddle embolus.  A 12 cm EKOS infusion catheter was then advanced over the second wire and positioned in the left lower lobe pulmonary artery.  The sheaths were secured in place with sterile bandages. Pulmonary arterial lysis was then initiated per standard protocol.  COMPLICATIONS: None  IMPRESSION: 1. Main pulmonary arterial pressure was 74/39 for a mean of 50 mmHg consistent with critical pulmonary arterial hypertension. 2. Bilateral pulmonary arteriography demonstrates large volume occlusive clot in the central right lower lobe pulmonary artery, and high burden of subocclusive caught throughout the left lower extremity pulmonary artery. 3. Initiation of bilateral ultrasound accelerated thrombolysis with EKOS catheters per code PE protocol.  Signed,  Sterling Big, MD  [facility]  [facility]   Electronically Signed   By: Malachy Moan M.D.   On: 05/28/2014 09:08   Dg Chest Port 1 View  05/29/2014   CLINICAL DATA:  Bilateral pulmonary emboli  EXAM: PORTABLE CHEST - 1 VIEW  COMPARISON:  05/28/2014  FINDINGS: Cardiac shadow remains enlarged. Bilateral EKOS catheters are again noted. And stable in appearance. Aortic calcifications are seen. The lungs are clear bilaterally.  IMPRESSION: Bilateral thrombolytic catheters.  No acute abnormality noted.   Electronically Signed   By: Alcide Clever M.D.   On: 05/29/2014 08:00   Dg Chest Port 1 View  05/28/2014   CLINICAL DATA:  Pulmonary embolism.  EXAM: PORTABLE CHEST - 1 VIEW  COMPARISON:  05/27/2014  FINDINGS: Exam demonstrates small caliber catheter is within the pulmonary arteries bilaterally. Lungs are hypoinflated with minimal hazy opacification in the left base/retrocardiac region slightly improved. Right lung is clear. Mild stable cardiomegaly. Remainder of the exam is unchanged.  IMPRESSION: Hypoinflation with interval  improvement in mild hazy left base/ retrocardiac opacification.  Small caliber bilateral pulmonary arterial catheters.   Electronically Signed   By: Elberta Fortis M.D.   On: 05/28/2014 10:14   Dg Chest Port 1 View  05/27/2014   CLINICAL DATA:  Four day history of shortness of breath  EXAM: PORTABLE CHEST - 1 VIEW  COMPARISON:  May 20, 2013  FINDINGS: There is left lower lobe airspace consolidation. Lungs are otherwise clear. Heart is mildly enlarged  with pulmonary vascularity within normal limits. No adenopathy. No bone lesions. Left  IMPRESSION: Left lower lobe consolidation.  Mild cardiac prominence, stable.   Electronically Signed   By: Bretta Bang III M.D.   On: 05/27/2014 11:59   Ir Infusion Thrombol Venous Initial (ms)  05/28/2014   CLINICAL DATA:  64 year old female with acute sub massive bordering on massive (borderline hypotension) large volume PE and CT evidence of right heart strain with significant respiratory distress and difficulty in maintaining oxygenation. Urgent ultrasound accelerated thrombolysis is warranted to facilitate unloading of the pulmonary arterial pressure and right heart strain.  EXAM: BILATERAL PULMONARY ARTERIOGRAPHY; ADDITIONAL ARTERIOGRAPHY; IR ULTRASOUND GUIDANCE VASC ACCESS RIGHT; IR INFUSION THROMBOL VENOUS INITIAL (MS)  Date: 05/28/2014  PROCEDURE: 1. Ultrasound guided right common femoral venous access 2. Second ultrasound-guided right common femoral venous access 3. Main pulmonary artery pressure measurement 4. Left pulmonary arteriogram 5. Right pulmonary arteriogram 6. EKOS catheter placement in the right lower lobe pulmonary artery 7. EKOS catheter placement in the left lower lobe pulmonary artery 8. Initiation of bilateral pulmonary arterial thrombolysis Interventional Radiologist:  Sterling Big, MD  ANESTHESIA/SEDATION: Moderate (conscious) sedation was used. 0.5 mg Versed, 25 mcg Fentanyl were administered intravenously. The patient's vital signs were  monitored continuously by radiology nursing throughout the procedure.  Sedation Time: 30 minutes  MEDICATIONS: TPA begun at 0.5 mg/hr in each catheter for a total of 1 mg/hr  FLUOROSCOPY TIME:  11 minutes 12 seconds  609 mGy  CONTRAST:  30mL OMNIPAQUE IOHEXOL 300 MG/ML  SOLN  TECHNIQUE: Informed consent was obtained from the patient following explanation of the procedure, risks, benefits and alternatives. The patient understands, agrees and consents for the procedure. All questions were addressed. A time out was performed.  Maximal barrier sterile technique utilized including caps, mask, sterile gowns, sterile gloves, large sterile drape, hand hygiene, and Betadine skin prep.  The right groin was interrogated with ultrasound. The common femoral vein is distended and compressible. There is no evidence of thrombus. An image was obtained and stored for the medical record. Local anesthesia was attained by infiltration with 1% lidocaine. Two small dermatotomy server made. Using ultrasound guidance, the vessel was punctured with a 21 gauge micropuncture needle. With the assistance of a micro wire, a 5 [place] transitional sheath was advanced into the vessel.  A second common femoral access was then obtained again using real-time sonographic guidance and a 21 gauge micropuncture needle and transitional 5 French micro sheath system. Both micro sheaths were then exchanged sequentially over 035 wires for working 6 [place] vascular sheaths.  A 5 French vert catheter was then navigated into the main pulmonary artery over a Bentson wire. Main pulmonary arterial pressures were obtained. The pulmonary arterial pressure was 74/39 for a mean of 50 mmHg. The 5 French catheter was next used to select the left main pulmonary artery. A limited left pulmonary arteriogram was performed. There is significant clot burden in the branches to the lower lobe. The catheter was navigated into the distal left lower lobe pulmonary artery. A Rosen  wire was advanced into the distal left lower lobe pulmonary artery in the 5 French catheter removed. The 5 French catheter was then inserted into the second 6 [place] vascular sheath an again navigated into the main pulmonary artery over a Bentson wire. This time, the catheter was used to select the right main pulmonary artery. A limited right pulmonary arteriogram was performed demonstrating occlusive clot in the right lower lobe pulmonary artery confirming  the findings of the recent CT scan. There is very poor perfusion of the right lung.  The catheter was advanced over the Bentson wire into the distal right lower lobe pulmonary artery. The Bentson wire was exchanged for a Rosen wire in the 5 [place] catheter removed. An 18 cm EKOS infusion catheter was then advanced over the wire and positioned in the right lower lobe pulmonary artery. The proximal aspect of the catheter was allowed to come back into the main pulmonary trunk to facilitate dating of the saddle embolus.  A 12 cm EKOS infusion catheter was then advanced over the second wire and positioned in the left lower lobe pulmonary artery.  The sheaths were secured in place with sterile bandages. Pulmonary arterial lysis was then initiated per standard protocol.  COMPLICATIONS: None  IMPRESSION: 1. Main pulmonary arterial pressure was 74/39 for a mean of 50 mmHg consistent with critical pulmonary arterial hypertension. 2. Bilateral pulmonary arteriography demonstrates large volume occlusive clot in the central right lower lobe pulmonary artery, and high burden of subocclusive caught throughout the left lower extremity pulmonary artery. 3. Initiation of bilateral ultrasound accelerated thrombolysis with EKOS catheters per code PE protocol.  Signed,  Sterling Big, MD  [facility]  [facility]   Electronically Signed   By: Malachy Moan M.D.   On: 05/28/2014 09:08   Ir Rande Lawman F/u Eval Art/ven Final Day  (ms)  05/29/2014   CLINICAL DATA:  64 year old female with sub massive a bordering on massive PE. Bilateral pulmonary artery ultrasound accelerated thrombolysis was initiated on 05/24 2016. Due to a suboptimal clinical result after initial 12 hours and 24 mg tPA, lysis was repeated for an additional 12 hours and 24 mg tPA. Patient now presents after significant clinical improvement for repeat pressure measurements and catheter removal.  EXAM: IR THROMB F/U EVAL ART/VEN FINAL DAY  Date: 05/29/2014  PROCEDURE: 1. Catheter repositioning under fluoroscopy 2. Pulmonary arterial pressure measurement 3. Removal of slices catheters and vascular sheaths Interventional Radiologist:  Sterling Big, MD  ANESTHESIA/SEDATION: None required  MEDICATIONS: None required  FLUOROSCOPY TIME:  54 seconds  33.8 mGy  CONTRAST:  None  TECHNIQUE: Informed consent was obtained from the patient following explanation of the procedure, risks, benefits and alternatives. The patient understands, agrees and consents for the procedure. All questions were addressed. A time out was performed.  Under fluoroscopic guidance, the right pulmonary artery lysis catheter was brought back so that the end hole was within the main pulmonary artery. The pressure transducer was then connected to the catheter which was flushed, aspirated and 0 to. Main pulmonary arterial pressure stents were then obtained. The main pulmonary arterial pressure is 56/24 for a mean of 34 mm Hg.  The catheter was removed. Similarly, the left pulmonary artery catheter was brought back into the main pulmonary artery in the pressure repeated with similar measurements. This catheter was removed. The vascular sheaths were then pulled and hemostasis attained by manual pressure.  COMPLICATIONS: None  IMPRESSION: 1. Significant improvement in main pulmonary arterial pressure to 56/24 (mean 34) mmHg following a total of 24 hours of a bilateral pulmonary arterial lie cyst using a total of  48 mg tPA. This occurred over a 36 hour period.  Signed,  Sterling Big, MD  [facility]  [facility]   Electronically Signed   By: Malachy Moan M.D.   On: 05/29/2014 12:37    Microbiology: Recent Results (from the past  240 hour(s))  MRSA PCR Screening     Status: None   Collection Time: 05/27/14 10:20 PM  Result Value Ref Range Status   MRSA by PCR NEGATIVE NEGATIVE Final    Comment:        The GeneXpert MRSA Assay (FDA approved for NASAL specimens only), is one component of a comprehensive MRSA colonization surveillance program. It is not intended to diagnose MRSA infection nor to guide or monitor treatment for MRSA infections.      Labs: Basic Metabolic Panel:  Recent Labs Lab 05/28/14 0350  05/29/14 0230 05/29/14 0430 05/29/14 1947 05/30/14 0404 05/30/14 1936  NA 136  < > 135 135 134* 136 135  K 5.3*  < > 5.2* 5.0 5.5* 5.0 5.0  CL 107  < > 108 107 105 105 106  CO2 18*  < > 17* 17* 15* 19* 20*  GLUCOSE 160*  < > 84 78 143* 109* 181*  BUN 33*  < > 43* 44* 37* 31* 22*  CREATININE 2.07*  < > 1.96* 1.90* 1.42* 1.25* 1.09*  CALCIUM 8.3*  < > 8.5* 8.4* 8.8* 9.0 8.7*  MG 2.0  --   --   --   --   --   --   PHOS 5.4*  --   --   --   --   --   --   < > = values in this interval not displayed. Liver Function Tests:  Recent Labs Lab 05/30/14 0404  AST 326*  ALT 621*  ALKPHOS 312*  BILITOT 0.7  PROT 6.9  ALBUMIN 3.1*   No results for input(s): LIPASE, AMYLASE in the last 168 hours. No results for input(s): AMMONIA in the last 168 hours. CBC:  Recent Labs Lab 05/29/14 0430 05/30/14 0404 05/31/14 0535 06/01/14 0441 06/02/14 0358  WBC 12.6* 12.7* 12.9* 11.7* 11.3*  NEUTROABS  --  10.1*  --   --   --   HGB 9.8* 10.5* 9.4* 9.3* 9.5*  HCT 31.6* 33.0* 30.1* 29.0* 30.2*  MCV 74.2* 74.7* 74.0* 74.2* 74.0*  PLT 95* 111* 144* 165 199   Cardiac Enzymes:  Recent Labs Lab 05/27/14 1745 05/27/14 2230  05/28/14 0350  TROPONINI 0.06* 0.14* 0.37*   BNP: BNP (last 3 results)  Recent Labs  05/27/14 1139  BNP 969.9*    ProBNP (last 3 results) No results for input(s): PROBNP in the last 8760 hours.  CBG:  Recent Labs Lab 06/01/14 1617 06/01/14 2056 06/02/14 0037 06/02/14 0401 06/02/14 0913  GLUCAP 121* 161* 172* 140* 193*       Signed:  Marinda ElkFELIZ ORTIZ, Samyah Bilbo  [facility] 06/02/2014, 9:27 AM

## 2014-06-02 NOTE — Progress Notes (Signed)
[facility] Anticipated discharge date:06/02/14 Family notified: pt to [facility]30[facility]  CSW signing off.  Merlyn LotJenna Holoman, LCSWA Clinical Social Worker [phone]

## 2014-06-02 NOTE — Clinical Social Work Note (Signed)
Clinical Social Work Assessment  Patient Details  Name: [name] MRN: [id number] Date of Birth: 1950-05-24  Date of referral:  06/02/14               Reason for consult:  Facility Placement                Permission sought to share information with:  Oceanographeracility Contact Representative Permission granted to share information::  Yes, Verbal Permission Granted  Name::        Agency::  [place] [facility]  Relationship::     Contact Information:     Housing/Transportation Living arrangements for the past 2 months:  Single Family Home Source of Information:  Patient Patient Interpreter Needed:  None Criminal Activity/Legal Involvement Pertinent to Current Situation/Hospitalization:  No - Comment as needed Significant Relationships:  Adult Children Lives with:    Do you feel safe going back to the place where you live?  Yes Need for family participation in patient care:  Yes (Comment)  Care giving concerns:   Pt lives at home with her adult son who works full time and is unable to offer assistance during the day   Office managerocial Worker assessment / plan: CSW spoke with pt about PT recommendation for SNF placement for short term rehab  Employment status:  Retired Health and safety inspectornsurance information:    PT Recommendations:  Skilled Nursing Facility Information / Referral to community resources:  Skilled Nursing Facility  Patient/Family's Response to care:  Pt is agreeable to SNF placement and understands that going home at this point would not be safe   Patient/Family's Understanding of and Emotional Response to Diagnosis, Current Treatment, and Prognosis:  Pt expresses good understanding of current medical needs prognosis  Emotional Assessment Appearance:  Appears stated age Attitude/Demeanor/Rapport:    Affect (typically observed):  Accepting, Appropriate, Calm, Pleasant Orientation:  Oriented to Self, Oriented to Place, Oriented to  Time, Oriented to Situation Alcohol / Substance use:  Not  Applicable Psych involvement (Current and /or in the community):  No (Comment)  Discharge Needs  Concerns to be addressed:  Home Safety Concerns, Discharge Planning Concerns Readmission within the last 30 days:  No Current discharge risk:  Physical Impairment Barriers to Discharge:  No SNF bed   Izora RibasHoloman, Julia Marker M, LCSW 06/02/2014, 9:54 AM

## 2014-06-02 NOTE — Progress Notes (Signed)
[facility] PROGRESS NOTE Interim History: 64 y.o. F brought to [facility] ED 5/24 with severe SOB. Found to have PE with CT evidence of RHS. PCCM called for admission.    Assessment/Plan: Pulmonary emboli leading to right heart enlargement: - CTA of the chest was done that showed a saddle PE with evidence of right heart strain. - PCCM was consulted he was given thrombolytics. He is now satting greater than 90% on 5 L. - started on heparin now transition to [employer] ext doppler showed no DVT Consult PT rec SNF. ECHO showed a depressed EF compared to previous EF 40 increase coreg & resume ARB. - Will need oxygen as an outpatient.  Thrombocytopenia: Most likely consumption coagulopathy due to PE, now resolved. PLT's cont to improve. Hypercoagulable panel showed no abnormalitties  Acute on Chronic renal disease stage III: - Improvement IV hydration after lytics. - Most likely due to mild shock.   Leukocytosis: Has remained afebrile most likely stress emargination. - improving.  Controlled diabetes mellitus type 2: Lives diet was started on low-dose Levemir  Code Status: full Family Communication: none  Disposition Plan: inpatient   Consultants:  PCCM  Procedures:  CT angio chest  Antibiotics:  None  HPI/Subjective: No complains, except for SOB with ambulation.  Objective: Filed Vitals:   06/01/14 1944 06/01/14 2005 06/02/14 0336 06/02/14 0827  BP: 152/98  135/83 147/87  Pulse: 93  83 87  Temp: 97.6 F (36.4 C)  98 F (36.7 C) 97.7 F (36.5 C)  TempSrc: Oral  Oral Oral  Resp: Height:      Weight:   146.3 kg (322 lb 8.5 oz)   SpO2: 99% 99% 98% 100%    Intake/Output Summary (Last 24 hours) at 06/02/14 0903 Last data filed at 06/02/14 0341  Gross per 24 hour  Intake      0 ml  Output   1875 ml  Net  -1875 ml   Filed Weights   05/31/14 0359 06/01/14 0405 06/02/14 0336  Weight: 135.626 kg (299 lb) 148.553 kg (327 lb 8 oz) 146.3  kg (322 lb 8.5 oz)    Exam:  General: Alert, awake, oriented x3, in no acute distress.  HEENT: No bruits, no goiter.  Heart: Regular rate and rhythm. Lungs: Good air movement, clear Abdomen: Soft, nontender, nondistended, positive bowel sounds.  Neuro: Grossly intact, nonfocal.   Data Reviewed: Basic Metabolic Panel:  Recent Labs Lab 05/28/14 0350  05/29/14 0230 05/29/14 0430 05/29/14 1947 05/30/14 0404 05/30/14 1936  NA 136  < > 135 135 134* 136 135  K 5.3*  < > 5.2* 5.0 5.5* 5.0 5.0  CL 107  < > 108 107 105 105 106  CO2 18*  < > 17* 17* 15* 19* 20*  GLUCOSE 160*  < > 84 78 143* 109* 181*  BUN 33*  < > 43* 44* 37* 31* 22*  CREATININE 2.07*  < > 1.96* 1.90* 1.42* 1.25* 1.09*  CALCIUM 8.3*  < > 8.5* 8.4* 8.8* 9.0 8.7*  MG 2.0  --   --   --   --   --   --   PHOS 5.4*  --   --   --   --   --   --   < > = values in this interval not displayed. Liver Function Tests:  Recent Labs Lab 05/30/14 0404  AST 326*  ALT 621*  ALKPHOS 312*  BILITOT 0.7  PROT 6.9  ALBUMIN 3.1*   No results for input(s): LIPASE, AMYLASE in the last 168 hours. No results for input(s): AMMONIA in the last 168 hours. CBC:  Recent Labs Lab 05/29/14 0430 05/30/14 0404 05/31/14 0535 06/01/14 0441 06/02/14 0358  WBC 12.6* 12.7* 12.9* 11.7* 11.3*  NEUTROABS  --  10.1*  --   --   --   HGB 9.8* 10.5* 9.4* 9.3* 9.5*  HCT 31.6* 33.0* 30.1* 29.0* 30.2*  MCV 74.2* 74.7* 74.0* 74.2* 74.0*  PLT 95* 111* 144* 165 199   Cardiac Enzymes:  Recent Labs Lab 05/27/14 1745 05/27/14 2230 05/28/14 0350  TROPONINI 0.06* 0.14* 0.37*   BNP (last 3 results)  Recent Labs  05/27/14 1139  BNP 969.9*    ProBNP (last 3 results) No results for input(s): PROBNP in the last 8760 hours.  CBG:  Recent Labs Lab 06/01/14 1131 06/01/14 1617 06/01/14 2056 06/02/14 0037 06/02/14 0401  GLUCAP 152* 121* 161* 172* 140*    Recent Results (from the past 240 hour(s))  MRSA PCR Screening     Status: None    Collection Time: 05/27/14 10:20 PM  Result Value Ref Range Status   MRSA by PCR NEGATIVE NEGATIVE Final    Comment:        The GeneXpert MRSA Assay (FDA approved for NASAL specimens only), is one component of a comprehensive MRSA colonization surveillance program. It is not intended to diagnose MRSA infection nor to guide or monitor treatment for MRSA infections.      Studies: No results found.  Scheduled Meds: . apixaban  10 mg Oral BID  . [START ON 06/07/2014] apixaban  5 mg Oral BID  . arformoterol  15 mcg Nebulization BID  . budesonide (PULMICORT) nebulizer solution  0.5 mg Nebulization BID  . buPROPion  100 mg Oral BID  . carvedilol  12.5 mg Oral BID WC  . hydrochlorothiazide  25 mg Oral Daily  . insulin aspart  0-20 Units Subcutaneous 6 times per day  . insulin detemir  5 Units Subcutaneous Daily  . irbesartan  150 mg Oral Daily  . pantoprazole  40 mg Oral Daily  . sodium chloride  3 mL Intravenous Q12H   Continuous Infusions:    Time Spent: 25 min   Marinda ElkFELIZ ORTIZ, Atisha Hamidi  [facility] Pager [phone]. If 7PM-7AM, please contact night-coverage at [email or web address], password [email or web address] 06/02/2014, 9:03 AM  LOS: 6 days

## 2014-06-02 NOTE — Care Management Note (Signed)
Case Management Note  Patient Details  Name: Carylon PerchesGeraldine E Coven MRN: [id number] Date of Birth: Jul 10, 1950  Subjective/Objective:    Plan for discharge to SNF.  Patient on board and ready to get stronger prior to going back home.                 Action/Plan:   Expected Discharge Date:                  Expected Discharge Plan:  Home/Self Care  In-House Referral:     Discharge planning Services  Medication Assistance, CM Consult  Post Acute Care Choice:    Choice offered to:     DME Arranged:    DME Agency:     HH Arranged:    HH Agency:     Status of Service:  Completed, signed off  Medicare Important Message Given:  Yes Date Medicare IM Given:  06/02/14 Medicare IM give by:  Avie ArenasSarah Katelin Kutsch, RNBSN  Date Additional Medicare IM Given:    Additional Medicare Important Message give by:     If discussed at Long Length of Stay Meetings, dates discussed:    Additional Comments:  Vangie BickerBrown, Malaya Cagley Jane, RN 06/02/2014, 10:44 AM

## 2014-06-02 NOTE — Clinical Social Work Placement (Signed)
   CLINICAL SOCIAL WORK PLACEMENT  NOTE  Date:  06/02/2014  Patient Details  Name: [name] MRN: [id number] Date of Birth: 09/23/1950  Clinical Social Work is seeking post-discharge placement for this patient at the Skilled  Nursing Facility level of care (*CSW will initial, date and re-position this form in  chart as items are completed):  Yes   Patient/family provided with [place] Clinical Social Work Department's list of facilities offering this level of care within the geographic area requested by the patient (or if unable, by the patient's family).  Yes   Patient/family informed of their freedom to choose among providers that offer the needed level of care, that participate in Medicare, Medicaid or managed care program needed by the patient, have an available bed and are willing to accept the patient.  Yes   Patient/family informed of [facility] [facility]'s ownership interest [facility], as well as of the fact that they are under no obligation to receive care at these facilities.  PASRR submitted to EDS on 06/02/14     PASRR number received on 06/02/14     Existing PASRR number confirmed on       FL2 transmitted to all facilities in geographic area requested by pt/family on 06/02/14     FL2 transmitted to all facilities within larger geographic area on       Patient informed that his/her managed care company has contracts with or will negotiate with certain facilities, including the following:            Patient/family informed of bed offers received.  Patient chooses bed at       Physician recommends and patient chooses bed at      Patient to be transferred to   on  .  Patient to be transferred to facility by       Patient family notified on   of transfer.  Name of family member notified:        PHYSICIAN Please sign FL2     Additional Comment:    _______________________________________________ Izora RibasHoloman, Sira Adsit M, LCSW 06/02/2014,  9:56 AM

## 2014-06-04 NOTE — Care Management Note (Signed)
Case Management Note  Patient Details  Name: Carylon PerchesGeraldine E Tonner MRN: [id number] Date of Birth: 1950-09-20  Subjective/Objective:                    Action/Plan:   Expected Discharge Date:                  Expected Discharge Plan:  Home/Self Care  In-House Referral:     Discharge planning Services  Medication Assistance, CM Consult  Post Acute Care Choice:    Choice offered to:     DME Arranged:    DME Agency:     HH Arranged:    HH Agency:     Status of Service:  Completed, signed off  Medicare Important Message Given:  Yes Date Medicare IM Given:  06/02/14 Medicare IM give by:  Avie ArenasSarah Brown, RNBSN  Date Additional Medicare IM Given:    Additional Medicare Important Message give by:     If discussed at Long Length of Stay Meetings, dates discussed:  06/03/2014  Additional Comments:  Yvone Neurutchfield, Caelum Federici M, RN 06/04/2014, 9:30 AM

## 2014-06-18 ENCOUNTER — Inpatient Hospital Stay: Payer: Medicare Other | Admitting: Internal Medicine

## 2014-06-26 ENCOUNTER — Ambulatory Visit (INDEPENDENT_AMBULATORY_CARE_PROVIDER_SITE_OTHER): Payer: Medicare Other | Admitting: Internal Medicine

## 2014-06-26 ENCOUNTER — Encounter: Payer: Self-pay | Admitting: Internal Medicine

## 2014-06-26 VITALS — BP 144/80 | HR 74 | Ht 67.0 in | Wt 304.2 lb

## 2014-06-26 DIAGNOSIS — R0602 Shortness of breath: Secondary | ICD-10-CM | POA: Diagnosis not present

## 2014-06-26 DIAGNOSIS — Z6841 Body Mass Index (BMI) 40.0 and over, adult: Secondary | ICD-10-CM

## 2014-06-26 DIAGNOSIS — I2699 Other pulmonary embolism without acute cor pulmonale: Secondary | ICD-10-CM | POA: Diagnosis not present

## 2014-06-26 DIAGNOSIS — R05 Cough: Secondary | ICD-10-CM | POA: Diagnosis not present

## 2014-06-26 DIAGNOSIS — R058 Other specified cough: Secondary | ICD-10-CM

## 2014-06-26 MED ORDER — APIXABAN 5 MG PO TABS: 5.0000 mg | ORAL_TABLET | Freq: Two times a day (BID) | ORAL | Status: DC

## 2014-06-26 NOTE — Patient Instructions (Addendum)
You need to return in 6 months (before stopping your blood thinning medication)- call sooner if you feel like you are losing ground with coughing or breathing  Your main issue/ risk factor for recurrent clot  is your weight which needs to be addressed  You will need to work out  formulary restrictions with your insurance company and Dr Hyman Hopes - try to make appt before your samples run out

## 2014-06-26 NOTE — Progress Notes (Signed)
Subjective:    Patient ID: [name], female    DOB: 22-Jan-1950   MRN: [id number]     Brief patient profile:  9 yobf never smoker with breathing problems around 2008  eval by Dr Petra Kuba never told she had asthma but better p inhaler but stopped it then again in same problem 2014 eval at  [facility] maint on advair 250 and alb since then using it daily but ? 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    History of Present Illness  05/20/2013 1st [facility] office visit/ [name] Chief Complaint  Patient presents with  . Pulmonary Consult    Referred per Dr. [name]. Pt c/o SOB for the past 2 years- esp when she first gets up in the am. She states also has non prod cough at night- cough drops help.     prev using saba up 2-3 daily but no need since stopped advair, still on ACEi and already on PPI daily  rec Leave off advair completely and the lisinopril and amlodipine and and hydroduril start tribenzor 40/10/25 one daily  Only use your albuterol as a rescue medication   Please schedule a follow up office visit in 6 weeks> did not return    05/13/2014 f/u ov/[name] re:  Chief Complaint  Patient presents with  . Follow-up    Pt c/o increased SOB for the past 2-3 months. Starting to improve after started taking Dulera inhaler.   back on acei and indolent onset progressive sob at rest and with slow adls since restarted though denies assoc cough and better on dulera   >>change ACE /Norvasc to Azor     05/27/2014 Acute OV   Pt presents to office with complaints of  SOB (extreme) x 4 days; can't hardly take a few steps with issues Has had chest pains on/off for few days.  Pt required assistance in office and needed wheelchair .  Cough is improved. Seen 2 weeks ago for dyspnea and cough .  Changed off ACE and norvasc to AZOR  . Cough is better .  Dyspnea got  worse 4 days ago.  Chart review showed echo 2014 with decreased EF at 40-45%.  ? Old MI .  She is a DM .  EKG in office is irregular with worsening R wave progression compared to 2014 with bigemeny (new ) .  We placed her on O2 at 2l/m . EMS was called for transport to ER for further evaluation for possible cardiac issues .  O2 sats on arrival were normal on RA in wheelchair. B/p normal.  +orthopnea and edema.  rec admit > dx pe    Admit date: 05/27/2014 Discharge date: 06/02/2014  Time spent: 35 minutes  Recommendations for Outpatient Follow-up:  1. Follow-up of pulmonary 2 weeks. 2. With a skilled nursing facility we will need oxygen for the next few weeks. 3. Continue up Apixiban 10 mg by mouth twice a day for 12 days, then continue 5 mg by mouth twice a day.  Discharge Diagnoses:  Active Problems:  PE (pulmonary embolism)  Pulmonary embolism  Pulmonary emboli  Right heart enlargement    Filed Weights   05/31/14 0359 06/01/14 0405 06/02/14 0336  Weight: 135.626 kg (299 lb) 148.553 kg (327 lb 8 oz) 146.3 kg (322 lb 8.5 oz)    History of present illness:  64 y.o. F brought to [facility] ED 5/24 with severe SOB. Found to have PE with CT evidence of RHS. PCCM called for admission.   Hospital Course:  Pulmonary emboli leading to right heart enlargement: - CTA of the chest was done that showed a saddle PE with evidence of right heart strain. - PCCM was consulted/ given thrombolytics.  - started on IV heparin on admission was her shortness of breath improved she was transition to NOAC's Lower ext dopplers showed L DVT  Consult PT rec SNF. ECHO showed a depressed EF compared to previous EF 40 increase coreg & resume ARB. - Will need oxygen as an outpatient.  Thrombocytopenia: Most likely consumption coagulopathy due to PE, now resolved. Hypercoagulable panel showed no abnormalitties  Acute on Chronic renal disease stage III: - Improvement IV hydration  after lytics. - Most likely due to mild shock.   Leukocytosis: Has remained afebrile most likely stress emargination. Resolved  Controlled diabetes mellitus type 2: No changes made to her medication          06/26/2014 extended post hosp f/u ov/Kyrese Gartman re: transition of care/ continued sob p PE/L DVT Chief Complaint  Patient presents with  . HFU    Breathing has improved some. She has not had to use her inhalers for the past 5 days. No new co's today.     Doe x more than room to room walking  No obvious day to day or daytime variabilty or assoc chronic cough or cp or chest tightness, subjective wheeze overt sinus or hb symptoms. No unusual exp hx or h/o childhood pna/ asthma or knowledge of premature birth.  Sleeping ok without nocturnal  or early am exacerbation  of respiratory  c/o's or need for noct saba. Also denies any obvious fluctuation of symptoms with weather or environmental changes or other aggravating or alleviating factors except as outlined above   Current Medications, Allergies, Complete Past Medical History, Past Surgical History, Family History, and Social History were reviewed in [employer] record.  ROS  The following are not active complaints unless bolded sore throat, dysphagia, dental problems, itching, sneezing,  nasal congestion or excess/ purulent secretions, ear ache,   fever, chills, sweats, unintended wt loss, pleuritic or exertional cp, hemoptysis,  orthopnea pnd or leg swelling, presyncope, palpitations, abdominal pain, anorexia, nausea, vomiting, diarrhea  or change in bowel or urinary habits, change in stools or urine, dysuria,hematuria,  rash, arthralgias, visual complaints, headache, numbness weakness or ataxia or problems with walking or coordination,  change in mood/affect or memory.             Objective:   Physical Exam    amb obese nad but very difficult to follow re details of care/ meds   Wt Readings from Last 3  Encounters:  06/26/14 304 lb 3.2 oz (137.984 kg)  06/02/14 322 lb 8.5 oz (146.3 kg)  05/27/14 309 lb (140.161 kg)    Vital signs reviewed   HEENT: nl dentition, turbinates, and orophanx. Nl external ear canals without cough reflex   NECK :  without JVD/Nodes/TM/ nl carotid upstrokes bilaterally   LUNGS: no acc muscle use, clear to A and P bilaterally without cough on insp or exp maneuvers   CV:  Irregular , tachy , tr edema   ABD:  soft and nontender with nl excursion in the supine position. No bruits or organomegaly, bowel sounds nl  MS:  warm without deformities, calf tenderness, cyanosis or clubbing  SKIN: warm and  dry without lesions    NEURO:  alert, approp, no deficits    I personally reviewed images and agree with radiology impression as follows:  CXR:   05/29/14 No acute abnormality noted.            Assessment & Plan:   Outpatient Encounter Prescriptions as of 06/26/2014  Medication Sig  . albuterol (PROVENTIL HFA;VENTOLIN HFA) 108 (90 BASE) MCG/ACT inhaler Inhale 1-2 puffs into the lungs every 6 (six) hours as needed for wheezing.  Marland Kitchen ALPRAZolam (XANAX) 0.25 MG tablet Take 1 tablet (0.25 mg total) by mouth 2 (two) times daily as needed for anxiety.  Marland Kitchen apixaban (ELIQUIS) 5 MG TABS tablet Take 1 tablet (5 mg total) by mouth 2 (two) times daily.  Marland Kitchen aspirin EC 81 MG EC tablet Take 1 tablet (81 mg total) by mouth daily.  Marland Kitchen BIOTIN PO Take 500 mcg by mouth daily.  Marland Kitchen buPROPion (WELLBUTRIN) 100 MG tablet Take 1 tablet (100 mg total) by mouth 2 (two) times daily.  . carvedilol (COREG) 12.5 MG tablet Take 1 tablet (12.5 mg total) by mouth 2 (two) times daily with a meal.  . glimepiride (AMARYL) 4 MG tablet Take 4 mg by mouth daily with breakfast.  . hydrochlorothiazide (HYDRODIURIL) 25 MG tablet Take 25 mg by mouth daily.  . irbesartan (AVAPRO) 150 MG tablet Take 1 tablet (150 mg total) by mouth daily.  . metFORMIN (GLUCOPHAGE) 500 MG tablet Take 1 tablet (500 mg  total) by mouth 2 (two) times daily with a meal.  . Multiple Vitamins-Minerals (MULTIVITAMIN WITH MINERALS) tablet Take 1 tablet by mouth daily.  . pantoprazole (PROTONIX) 40 MG tablet Take 40 mg by mouth daily as needed.   . [DISCONTINUED] apixaban (ELIQUIS) 5 MG TABS tablet Take 1 tablet (5 mg total) by mouth 2 (two) times daily.  . [DISCONTINUED] mometasone-formoterol (DULERA) 100-5 MCG/ACT AERO Inhale 2 puffs into the lungs 2 (two) times daily as needed.   . [DISCONTINUED] apixaban (ELIQUIS) 5 MG TABS tablet Take 2 tablets (10 mg total) by mouth 2 (two) times daily. (Patient not taking: Reported on 06/26/2014)  . [DISCONTINUED] lisinopril (PRINIVIL,ZESTRIL) 40 MG tablet Take 40 mg by mouth daily.   No facility-administered encounter medications on file as of 06/26/2014.

## 2014-06-27 ENCOUNTER — Inpatient Hospital Stay: Payer: Medicare Other | Admitting: Internal Medicine

## 2014-06-29 ENCOUNTER — Encounter: Payer: Self-pay | Admitting: Internal Medicine

## 2014-06-29 NOTE — Assessment & Plan Note (Addendum)
Dx 05/27/14 with RV strain > IR directed thrombolysis - Venous dopplers 05/29/14 > pos on Left - Echo 05/30/14 moderately to severely dilated. Systolic function was moderately to severely reduced - 06/26/14    Walked RA x one lap @ 185 stopped due to  Fatigue, no sob or desat at slow pace   I had an extended final summary discussion with the patient reviewing all relevant studies completed to date and  lasting 25 minutes of a visit on the following issues:    1) she is lucky to have survived massive PE but until she begins to correct her weight this wont' be her only risk of obesity (hbp/dm also present)  2) needs min 6 months then repeat echo/venous dopplers and reassess risk/cost/benefit then   3) Formulary restrictions will be an ongoing challenge for the forseable future and I would be happy to pick an alternative if the pt will first  provide me a list of them but pt  will need to return here for training for any new device that is required eg dpi vs hfa vs respimat.    In meantime we can always provide samples so the patient never runs out of any needed respiratory medications.   4) Each maintenance medication was reviewed in detail including most importantly the difference between maintenance and as needed and under what circumstances the prns are to be used.  Please see instructions for details which were reviewed in writing and the patient given a copy.    5) pulmonary f/u is at 6 months if she/ Dr Hyman Hopes want our opinion but certainly not mandatory if Dr Hyman Hopes comfortable we can just see prn

## 2014-06-29 NOTE — Assessment & Plan Note (Signed)
Body mass index is 47.63 kg/(m^2).  Lab Results  Component Value Date   TSH 0.762  07/03/2009     Contributing to gerd tendency/ doe/risk for recurrent dvt ? Lifelong >>  needs to achieve and maintain neg calorie balance >f/u primary care

## 2014-06-29 NOTE — Assessment & Plan Note (Signed)
-   trial off acei 05/20/2013 > repeat 05/14/2014 > resolved at f/u 06/26/14    Classic Upper airway cough syndrome, so named because it's frequently impossible to sort out how much is  CR/sinusitis with freq throat clearing (which can be related to primary GERD)   vs  causing  secondary (" extra esophageal")  GERD from wide swings in gastric pressure that occur with throat clearing, often  promoting self use of mint and menthol lozenges that reduce the lower esophageal sphincter tone and exacerbate the problem further in a cyclical fashion.   These are the same pts (now being labeled as having "irritable larynx syndrome" by some cough centers) who not infrequently have a history of having failed to tolerate ace inhibitors,  dry powder inhalers or biphosphonates or report having atypical reflux symptoms that don't respond to standard doses of PPI , and are easily confused as having aecopd or asthma flares by even experienced allergists/ pulmonologists.   Would not rechallenge with ACE in this setting / continue gerd rx for now and f/u prn

## 2014-07-14 DIAGNOSIS — N179 Acute kidney failure, unspecified: Secondary | ICD-10-CM | POA: Insufficient documentation

## 2014-07-21 ENCOUNTER — Encounter (HOSPITAL_COMMUNITY): Payer: Self-pay | Admitting: Emergency Medicine

## 2014-07-21 ENCOUNTER — Emergency Department (HOSPITAL_COMMUNITY)
Admission: EM | Admit: 2014-07-21 | Discharge: 2014-07-21 | Disposition: A | Discharge status: Home / Self Care | Payer: Medicare Other | Attending: Emergency Medicine | Admitting: Emergency Medicine

## 2014-07-21 DIAGNOSIS — M1990 Unspecified osteoarthritis, unspecified site: Secondary | ICD-10-CM | POA: Diagnosis not present

## 2014-07-21 DIAGNOSIS — T465X5A Adverse effect of other antihypertensive drugs, initial encounter: Secondary | ICD-10-CM | POA: Diagnosis not present

## 2014-07-21 DIAGNOSIS — R0602 Shortness of breath: Secondary | ICD-10-CM | POA: Insufficient documentation

## 2014-07-21 DIAGNOSIS — Z7902 Long term (current) use of antithrombotics/antiplatelets: Secondary | ICD-10-CM | POA: Diagnosis not present

## 2014-07-21 DIAGNOSIS — Z7982 Long term (current) use of aspirin: Secondary | ICD-10-CM | POA: Diagnosis not present

## 2014-07-21 DIAGNOSIS — E669 Obesity, unspecified: Secondary | ICD-10-CM | POA: Diagnosis not present

## 2014-07-21 DIAGNOSIS — R21 Rash and other nonspecific skin eruption: Secondary | ICD-10-CM | POA: Insufficient documentation

## 2014-07-21 DIAGNOSIS — Z79899 Other long term (current) drug therapy: Secondary | ICD-10-CM | POA: Diagnosis not present

## 2014-07-21 DIAGNOSIS — T50905A Adverse effect of unspecified drugs, medicaments and biological substances, initial encounter: Secondary | ICD-10-CM

## 2014-07-21 DIAGNOSIS — I10 Essential (primary) hypertension: Secondary | ICD-10-CM | POA: Insufficient documentation

## 2014-07-21 DIAGNOSIS — E119 Type 2 diabetes mellitus without complications: Secondary | ICD-10-CM | POA: Insufficient documentation

## 2014-07-21 LAB — COMPREHENSIVE METABOLIC PANEL
ALBUMIN: 3.3 g/dL — AB (ref 3.5–5.0)
ALT: 20 U/L (ref 14–54)
ANION GAP: 9 (ref 5–15)
AST: 24 U/L (ref 15–41)
Alkaline Phosphatase: 124 U/L (ref 38–126)
BUN: 15 mg/dL (ref 6–20)
CO2: 28 mmol/L (ref 22–32)
CREATININE: 0.98 mg/dL (ref 0.44–1.00)
Calcium: 9.9 mg/dL (ref 8.9–10.3)
Chloride: 104 mmol/L (ref 101–111)
GFR, EST NON AFRICAN AMERICAN: 60 mL/min — AB (ref >60)
GLUCOSE: 102 mg/dL — AB (ref 65–99)
POTASSIUM: 3.8 mmol/L (ref 3.5–5.1)
SODIUM: 141 mmol/L (ref 135–145)
TOTAL PROTEIN: 6.9 g/dL (ref 6.5–8.1)
Total Bilirubin: 0.4 mg/dL (ref 0.3–1.2)

## 2014-07-21 LAB — CBC WITH DIFFERENTIAL/PLATELET
BASOS ABS: 0 10*3/uL (ref 0.0–0.1)
Basophils Relative: 0 % (ref 0–1)
Eosinophils Absolute: 0.2 10*3/uL (ref 0.0–0.7)
Eosinophils Relative: 3 % (ref 0–5)
HCT: 34.6 % — ABNORMAL LOW (ref 36.0–46.0)
Hemoglobin: 10.9 g/dL — ABNORMAL LOW (ref 12.0–15.0)
LYMPHS ABS: 2.1 10*3/uL (ref 0.7–4.0)
LYMPHS PCT: 25 % (ref 12–46)
MCH: 23.9 pg — ABNORMAL LOW (ref 26.0–34.0)
MCHC: 31.5 g/dL (ref 30.0–36.0)
MCV: 75.7 fL — ABNORMAL LOW (ref 78.0–100.0)
Monocytes Absolute: 0.4 10*3/uL (ref 0.1–1.0)
Monocytes Relative: 5 % (ref 3–12)
Neutro Abs: 5.7 10*3/uL (ref 1.7–7.7)
Neutrophils Relative %: 67 % (ref 43–77)
PLATELETS: 294 10*3/uL (ref 150–400)
RBC: 4.57 MIL/uL (ref 3.87–5.11)
RDW: 19.2 % — AB (ref 11.5–15.5)
WBC: 8.5 10*3/uL (ref 4.0–10.5)

## 2014-07-21 LAB — CBG MONITORING, ED: Glucose-Capillary: 85 mg/dL (ref 65–99)

## 2014-07-21 NOTE — ED Provider Notes (Signed)
CSN: [id number]     Arrival date & time 07/21/14  1037 History   First MD Initiated Contact with Patient 07/21/14 1050     Chief Complaint  Patient presents with  . Medication Reaction      HPI Pt c/o rash to B/L legs and arms. Pt also c/o shortness of breath. Pt recently has her BP medication changed and reports that she is experiencing the symptoms of a reaction from the medication. Past Medical History  Diagnosis Date  . Osteoarthritis   . Hypertension   . Morbid obesity   . Diabetes mellitus without complication    Past Surgical History  Procedure Laterality Date  . Cesarean section      x 3   Family History  Problem Relation Age of Onset  . Hypertension Mother   . Allergies Daughter   . Allergies Sister   . Asthma Daughter    History  Substance Use Topics  . Smoking status: Never Smoker   . Smokeless tobacco: Never Used  . Alcohol Use: No   OB History    No data available     Review of Systems  All other systems reviewed and are negative  Allergies  Review of patient's allergies indicates no known allergies.  Home Medications   Prior to Admission medications   Medication Sig Start Date End Date Taking? Authorizing Provider  albuterol (PROVENTIL HFA;VENTOLIN HFA) 108 (90 BASE) MCG/ACT inhaler Inhale 1-2 puffs into the lungs every 6 (six) hours as needed for wheezing. 01/22/12   Nelva Nayobert Kemal Amores, MD  ALPRAZolam Prudy Feeler(XANAX) 0.25 MG tablet Take 1 tablet (0.25 mg total) by mouth 2 (two) times daily as needed for anxiety. 06/02/14   Marinda ElkAbraham Feliz Ortiz, MD  apixaban (ELIQUIS) 5 MG TABS tablet Take 1 tablet (5 mg total) by mouth 2 (two) times daily. 06/26/14   Nyoka CowdenMichael B Wert, MD  aspirin EC 81 MG EC tablet Take 1 tablet (81 mg total) by mouth daily. 07/10/12   Shanker Levora DredgeM Ghimire, MD  BIOTIN PO Take 500 mcg by mouth daily.    Historical Provider, MD  buPROPion (WELLBUTRIN) 100 MG tablet Take 1 tablet (100 mg total) by mouth 2 (two) times daily. 06/02/14   Marinda ElkAbraham Feliz Ortiz,  MD  carvedilol (COREG) 12.5 MG tablet Take 1 tablet (12.5 mg total) by mouth 2 (two) times daily with a meal. 06/02/14   Marinda ElkAbraham Feliz Ortiz, MD  glimepiride (AMARYL) 4 MG tablet Take 4 mg by mouth daily with breakfast.    Historical Provider, MD  hydrochlorothiazide (HYDRODIURIL) 25 MG tablet Take 25 mg by mouth daily.    Historical Provider, MD  irbesartan (AVAPRO) 150 MG tablet Take 1 tablet (150 mg total) by mouth daily. 06/02/14   Marinda ElkAbraham Feliz Ortiz, MD  lisinopril (PRINIVIL,ZESTRIL) 40 MG tablet Take 40 mg by mouth daily. 07/21/14   Historical Provider, MD  metFORMIN (GLUCOPHAGE) 500 MG tablet Take 1 tablet (500 mg total) by mouth 2 (two) times daily with a meal. 07/11/12   Clydia LlanoMutaz Elmahi, MD  Multiple Vitamins-Minerals (MULTIVITAMIN WITH MINERALS) tablet Take 1 tablet by mouth daily.    Historical Provider, MD  pantoprazole (PROTONIX) 40 MG tablet Take 40 mg by mouth daily as needed.     Historical Provider, MD   BP 159/101 mmHg  Pulse 90  Temp(Src) 98.4 F (36.9 C) (Oral)  Resp 25  Ht 5\' 7"  (1.702 m)  Wt 304 lb (137.893 kg)  BMI 47.60 kg/m2  SpO2 97% Physical Exam Physical Exam  Nursing note and vitals reviewed. Constitutional: She is oriented to person, place, and time. She appears well-developed and well-nourished. No distress.  HENT:  Head: Normocephalic and atraumatic.  Eyes: Pupils are equal, round, and reactive to light.  Neck: Normal range of motion.  Cardiovascular: Normal rate and intact distal pulses.   Pulmonary/Chest: No respiratory distress.  Abdominal: Normal appearance. She exhibits no distension.  Musculoskeletal: Normal range of motion.  Neurological: She is alert and oriented to person, place, and time. No cranial nerve deficit.  Skin: Skin is warm and dry.  Small petechiae noted on arms legs and back area.  Some areas of old bruising noted. Psychiatric: She has a normal mood and affect. Her behavior is normal. petechial type rash noted on arms, legs, and  chest.  ED Course  Procedures (including critical care time) Labs Review Labs Reviewed  COMPREHENSIVE METABOLIC PANEL - Abnormal; Notable for the following:    Glucose, Bld 102 (*)    Albumin 3.3 (*)    GFR calc non Af Amer 60 (*)    All other components within normal limits  CBC WITH DIFFERENTIAL/PLATELET - Abnormal; Notable for the following:    Hemoglobin 10.9 (*)    HCT 34.6 (*)    MCV 75.7 (*)    MCH 23.9 (*)    RDW 19.2 (*)    All other components within normal limits  CBG MONITORING, ED     After reviewing her medication list I suspect her rash is secondary to". these areas almost look like they turn into bruises.  They do not appear to be serious at this time.  There is no evidence of infection.  Will continue the Eliquis and have her follow-up with her pulmonologist for further guidance on keeping her on that medication or changing her to something different.   MDM   Final diagnoses:  Adverse effects of medication, initial encounter        Nelva Nay, MD 07/21/14 1341

## 2014-07-21 NOTE — ED Notes (Signed)
Pt c/o rash to B/L legs and arms. Pt also c/o shortness of breath. Pt recently has her BP medication changed and reports that she is experiencing the symptoms of a reaction from the medication.

## 2014-07-24 ENCOUNTER — Encounter: Payer: Self-pay | Admitting: Internal Medicine

## 2014-07-24 ENCOUNTER — Ambulatory Visit (INDEPENDENT_AMBULATORY_CARE_PROVIDER_SITE_OTHER): Payer: Medicare Other | Admitting: Internal Medicine

## 2014-07-24 VITALS — BP 130/96 | HR 57 | Ht 67.0 in | Wt 306.6 lb

## 2014-07-24 DIAGNOSIS — Z6841 Body Mass Index (BMI) 40.0 and over, adult: Secondary | ICD-10-CM

## 2014-07-24 DIAGNOSIS — I2699 Other pulmonary embolism without acute cor pulmonale: Secondary | ICD-10-CM | POA: Diagnosis not present

## 2014-07-24 DIAGNOSIS — R0602 Shortness of breath: Secondary | ICD-10-CM

## 2014-07-24 DIAGNOSIS — I10 Essential (primary) hypertension: Secondary | ICD-10-CM | POA: Diagnosis not present

## 2014-07-24 MED ORDER — IRBESARTAN 150 MG PO TABS: 150.0000 mg | ORAL_TABLET | Freq: Every day | ORAL | Status: DC

## 2014-07-24 MED ORDER — CARVEDILOL 12.5 MG PO TABS: 12.5000 mg | ORAL_TABLET | Freq: Two times a day (BID) | ORAL | Status: DC

## 2014-07-24 NOTE — Assessment & Plan Note (Signed)
Body mass index is 48.01    Lab Results  Component Value Date   TSH 0.762   07/03/2009     Contributing to gerd tendency/ doe/recurrent dvt>>  needs to achieve and maintain neg calorie balance >f/u primary care

## 2014-07-24 NOTE — Assessment & Plan Note (Signed)
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

## 2014-07-24 NOTE — Assessment & Plan Note (Signed)
Dx 05/27/14 with RV strain > IR directed thrombolysis - Venous dopplers 05/29/14 > pos on Left - Echo 05/30/14 moderately to severely dilated. Systolic function was moderately to severely reduced - 06/26/14    Walked RA x one lap @ 185 stopped due to  Fatigue, no sob or desat at slow pace  - 07/24/2014  Walked RA  2 laps @ 185 ft each stopped due to  [employer] /fatigue/ no desats at moderate pace   Clearly improving, no change in rx needed

## 2014-07-24 NOTE — Assessment & Plan Note (Addendum)
-   07/24/2014  Walked RA x 2 laps @ 185 ft each stopped due to mild sob/fatigue, no desat - spirometry 07/24/2014 s obst "on dulera prn" with very poor hfa so rec d/c dulera and just use saba prn   I had an extended final summary discussion with the patient reviewing all relevant studies completed to date and  lasting 15 to 20 minutes of a 25 minute visit on the following issues:    Really no evidence of asthma at all here considering she doesn't understand how to use hfa and is on non-specific BB > if really does start to wheeze or need saba would try to prove asthma with spirometry when flaring and if truly present next step is change coreg to bisoprolol   Each maintenance medication was reviewed in detail including most importantly the difference between maintenance and as needed and under what circumstances the prns are to be used.  Please see instructions for details which were reviewed in writing and the patient given a copy.

## 2014-07-24 NOTE — Patient Instructions (Addendum)
Please see patient coordinator before you leave today to schedule internal medicine for treatment of your hypertension and blood clots   Stop dulera   Only use your albuterol as a rescue medication to be used if you can't catch your breath by resting or doing a relaxed purse lip breathing pattern.  - The less you use it, the better it will work when you need it. - Ok to use up to 2 puffs  every 4 hours if you must but call for immediate appointment if use goes up over your usual need - Don't leave home without it !!  (think of it like the spare tire for your car)   Pulmonary follow up is as needed once you establish with internal medicine/ primary

## 2014-07-24 NOTE — Progress Notes (Signed)
Subjective:    Patient ID: [name], female    DOB: December 06, 1950   MRN: [id number]     Brief patient profile:  41 yobf never smoker with breathing problems around 2008  eval by Dr Petra Kuba never told she had asthma but better p inhaler but stopped it then again in same problem 2014 eval at  [facility] maint on advair 250 and alb since then using it daily but ? 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    History of Present Illness  05/20/2013 1st [facility] office visit/ [name] Chief Complaint  Patient presents with  . Pulmonary Consult    Referred per Dr. [name]. Pt c/o SOB for the past 2 years- esp when she first gets up in the am. She states also has non prod cough at night- cough drops help.     prev using saba up 2-3 daily but no need since stopped advair, still on ACEi and already on PPI daily  rec Leave off advair completely and the lisinopril and amlodipine and and hydroduril start tribenzor 40/10/25 one daily  Only use your albuterol as a rescue medication   Please schedule a follow up office visit in 6 weeks> did not return    05/13/2014 f/u ov/[name] re:  prob acei sob  Chief Complaint  Patient presents with  . Follow-up    Pt c/o increased SOB for the past 2-3 months. Starting to improve after started taking Dulera inhaler.   back on acei and indolent onset progressive sob at rest and with slow adls since restarted acei though denies assoc cough and better on dulera   >>change ACE /Norvasc to Azor     05/27/2014 Acute OV   Pt presents to office with complaints of  SOB (extreme) x 4 days; can't hardly take a few steps with issues Has had chest pains on/off for few days.  Pt required assistance in office and needed wheelchair .  Cough is improved. Seen 2 weeks ago for dyspnea and cough .  Changed off ACE and norvasc to AZOR  . Cough is better  .  Dyspnea got worse 4 days ago.  Chart review showed echo 2014 with decreased EF at 40-45%.  ? Old MI .  She is a DM .  EKG in office is irregular with worsening R wave progression compared to 2014 with bigemeny (new ) .  We placed her on O2 at 2l/m . EMS was called for transport to ER for further evaluation for possible cardiac issues .  O2 sats on arrival were normal on RA in wheelchair. B/p normal.  +orthopnea and edema.  rec admit > dx pe    Admit date: 05/27/2014 Discharge date: 06/02/2014  Time spent: 35 minutes  Recommendations for Outpatient Follow-up:  1. Follow-up of pulmonary 2 weeks. 2. With a skilled nursing facility we will need oxygen for the next few weeks. 3. Continue up Apixiban 10 mg by mouth twice a day for 12 days, then continue 5 mg by mouth twice a day.  Discharge Diagnoses:  Active Problems:  PE (pulmonary embolism)  Pulmonary embolism  Pulmonary emboli  Right heart enlargement    Filed Weights   05/31/14 0359 06/01/14 0405 06/02/14 0336  Weight: 135.626 kg (299 lb) 148.553 kg (327 lb 8 oz) 146.3 kg (322 lb 8.5 oz)    History  of present illness:   63 y.o. F brought to [facility] ED 5/24 with severe SOB. Found to have PE with CT evidence of RHS. PCCM called for admission.   Hospital Course:  Pulmonary emboli leading to right heart enlargement: - CTA of the chest was done that showed a saddle PE with evidence of right heart strain. - PCCM was consulted/ given thrombolytics.  - started on IV heparin on admission was her shortness of breath improved she was transition to NOAC's Lower ext dopplers showed L DVT  Consult PT rec SNF. ECHO showed a depressed EF compared to previous EF 40 increase coreg & resume ARB. - Will need oxygen as an outpatient.  Thrombocytopenia: Most likely consumption coagulopathy due to PE, now resolved. Hypercoagulable panel showed no abnormalitties  Acute on Chronic renal disease stage III: - Improvement  IV hydration after lytics. - Most likely due to mild shock.   Leukocytosis: Has remained afebrile most likely stress emargination. Resolved  Controlled diabetes mellitus type 2: No changes made to her medication       06/26/2014 extended post hosp f/u ov/[name] re: transition of care/ continued sob p PE/L DVT Chief Complaint  Patient presents with  . HFU    Breathing has improved some. She has not had to use her inhalers for the past 5 days. No new co's today.  Doe x more than room to room walking rec You need to return in 6 months (before stopping your blood thinning medication)- call sooner if you feel like you are losing ground with coughing or breathing Your main issue/ risk factor for recurrent clot  is your weight which needs to be addressed You will need to work out  formulary restrictions with your insurance company and Dr Hyman Hopes - try to make appt before your samples run out    07/24/2014 f/u ov/[name] re: s/p PE/ ?pseudoasthma from acei vs asthma from coreg  Chief Complaint  Patient presents with  . Follow-up    Pt states developed rash over the last visit- went to ED for this on 07/21/14- hydrocortisone helps.    thoroughly confused with med instructions/ breathing fine unless gets out in heat / on coreg and off arb   No obvious day to day or daytime variabilty or assoc chronic cough or cp or chest tightness, subjective wheeze overt sinus or hb symptoms. No unusual exp hx or h/o childhood pna/ asthma or knowledge of premature birth.  Sleeping ok without nocturnal  or early am exacerbation  of respiratory  c/o's or need for noct saba. Also denies any obvious fluctuation of symptoms with weather or environmental changes or other aggravating or alleviating factors except as outlined above   Current Medications, Allergies, Complete Past Medical History, Past Surgical History, Family History, and Social History were reviewed in [employer] record.  ROS  The  following are not active complaints unless bolded sore throat, dysphagia, dental problems, itching, sneezing,  nasal congestion or excess/ purulent secretions, ear ache,   fever, chills, sweats, unintended wt loss, pleuritic or exertional cp, hemoptysis,  orthopnea pnd or leg swelling, presyncope, palpitations, abdominal pain, anorexia, nausea, vomiting, diarrhea  or change in bowel or urinary habits, change in stools or urine, dysuria,hematuria,  rash, arthralgias, visual complaints, headache, numbness weakness or ataxia or problems with walking or coordination,  change in mood/affect or memory.             Objective:   Physical Exam    amb obese nad  but very difficult to follow re details of care/ meds  07/24/2014        307 Wt Readings from Last 3 Encounters:  06/26/14 304 lb 3.2 oz (137.984 kg)  06/02/14 322 lb 8.5 oz (146.3 kg)  05/27/14 309 lb (140.161 kg)    Vital signs reviewed   HEENT: nl dentition, turbinates, and orophanx. Nl external ear canals without cough reflex   NECK :  without JVD/Nodes/TM/ nl carotid upstrokes bilaterally   LUNGS: no acc muscle use, clear to A and P bilaterally without cough on insp or exp maneuvers   CV:  RRR no S3 or murmur, no sign edema   ABD:  soft and nontender with nl excursion in the supine position. No bruits or organomegaly, bowel sounds nl  MS:  warm without deformities, calf tenderness, cyanosis or clubbing  SKIN: warm and dry without lesions    NEURO:  alert, approp, no deficits    I personally reviewed images and agree with radiology impression as follows:  CXR:   05/29/14 No acute abnormality noted.            Assessment & Plan:   Outpatient Encounter Prescriptions as of 07/24/2014  Medication Sig  . albuterol (PROVENTIL HFA;VENTOLIN HFA) 108 (90 BASE) MCG/ACT inhaler Inhale 1-2 puffs into the lungs every 6 (six) hours as needed for wheezing.  Marland Kitchen apixaban (ELIQUIS) 5 MG TABS tablet Take 1 tablet (5 mg total) by  mouth 2 (two) times daily.  . carvedilol (COREG) 12.5 MG tablet Take 1 tablet (12.5 mg total) by mouth 2 (two) times daily with a meal.  . glimepiride (AMARYL) 4 MG tablet Take 4 mg by mouth daily with breakfast.  . hydrocortisone cream 1 % Apply 1 application topically as needed for itching.  . metFORMIN (GLUCOPHAGE) 500 MG tablet Take 1 tablet (500 mg total) by mouth 2 (two) times daily with a meal.  . [DISCONTINUED] carvedilol (COREG) 12.5 MG tablet Take 1 tablet (12.5 mg total) by mouth 2 (two) times daily with a meal.  . [DISCONTINUED] mometasone-formoterol (DULERA) 100-5 MCG/ACT AERO Inhale 2 puffs into the lungs 2 (two) times daily as needed for wheezing.  . irbesartan (AVAPRO) 150 MG tablet Take 1 tablet (150 mg total) by mouth daily.  . [DISCONTINUED] ALPRAZolam (XANAX) 0.25 MG tablet Take 1 tablet (0.25 mg total) by mouth 2 (two) times daily as needed for anxiety.  . [DISCONTINUED] aspirin EC 81 MG EC tablet Take 1 tablet (81 mg total) by mouth daily.  . [DISCONTINUED] BIOTIN PO Take 500 mcg by mouth daily.  . [DISCONTINUED] buPROPion (WELLBUTRIN) 100 MG tablet Take 1 tablet (100 mg total) by mouth 2 (two) times daily.  . [DISCONTINUED] hydrochlorothiazide (HYDRODIURIL) 25 MG tablet Take 25 mg by mouth daily.  . [DISCONTINUED] irbesartan (AVAPRO) 150 MG tablet Take 1 tablet (150 mg total) by mouth daily.  . [DISCONTINUED] irbesartan (AVAPRO) 150 MG tablet Take 1 tablet (150 mg total) by mouth daily.  . [DISCONTINUED] lisinopril (PRINIVIL,ZESTRIL) 40 MG tablet Take 40 mg by mouth daily.  . [DISCONTINUED] Multiple Vitamins-Minerals (MULTIVITAMIN WITH MINERALS) tablet Take 1 tablet by mouth daily.  . [DISCONTINUED] pantoprazole (PROTONIX) 40 MG tablet Take 40 mg by mouth daily as needed.    No facility-administered encounter medications on file as of 07/24/2014.

## 2014-09-01 ENCOUNTER — Encounter (HOSPITAL_COMMUNITY): Payer: Self-pay | Admitting: Emergency Medicine

## 2014-09-01 ENCOUNTER — Emergency Department (INDEPENDENT_AMBULATORY_CARE_PROVIDER_SITE_OTHER)
Admission: EM | Admit: 2014-09-01 | Discharge: 2014-09-01 | Disposition: A | Discharge status: Nursing Home (Includes ICF) | Payer: Medicare Other | Source: Home / Self Care | Attending: Family Medicine | Admitting: Family Medicine

## 2014-09-01 DIAGNOSIS — L508 Other urticaria: Secondary | ICD-10-CM | POA: Diagnosis not present

## 2014-09-01 NOTE — ED Provider Notes (Signed)
CSN: [id number]     Arrival date & time 09/01/14  1559 History   First MD Initiated Contact with Patient 09/01/14 1747     Chief Complaint  Patient presents with  . Urticaria   (Consider location/radiation/quality/duration/timing/severity/associated sxs/prior Treatment) Patient is a 64 y.o. female presenting with rash. The history is provided by the patient.  Rash Location:  Full body Quality: itchiness, redness and swelling   Severity:  Moderate Onset quality:  Gradual Duration:  1 month Progression:  Spreading Chronicity:  Recurrent Context: medications   Associated symptoms: no shortness of breath and not wheezing     Past Medical History  Diagnosis Date  . Osteoarthritis   . Hypertension   . Morbid obesity   . Diabetes mellitus without complication    Past Surgical History  Procedure Laterality Date  . Cesarean section      x 3   Family History  Problem Relation Age of Onset  . Hypertension Mother   . Allergies Daughter   . Allergies Sister   . Asthma Daughter    Social History  Substance Use Topics  . Smoking status: Never Smoker   . Smokeless tobacco: Never Used  . Alcohol Use: No   OB History    No data available     Review of Systems  Constitutional: Negative.   Respiratory: Negative for shortness of breath and wheezing.   Skin: Positive for rash.    Allergies  Review of patient's allergies indicates no known allergies.  Home Medications   Prior to Admission medications   Medication Sig Start Date End Date Taking? Authorizing Provider  albuterol (PROVENTIL HFA;VENTOLIN HFA) 108 (90 BASE) MCG/ACT inhaler Inhale 1-2 puffs into the lungs every 6 (six) hours as needed for wheezing. 01/22/12   Nelva Nay, MD  apixaban (ELIQUIS) 5 MG TABS tablet Take 1 tablet (5 mg total) by mouth 2 (two) times daily. 06/26/14   Nyoka Cowden, MD  carvedilol (COREG) 12.5 MG tablet Take 1 tablet (12.5 mg total) by mouth 2 (two) times daily with a meal. 07/24/14    Nyoka Cowden, MD  glimepiride (AMARYL) 4 MG tablet Take 4 mg by mouth daily with breakfast.    Historical Provider, MD  hydrocortisone cream 1 % Apply 1 application topically as needed for itching.    Historical Provider, MD  irbesartan (AVAPRO) 150 MG tablet Take 1 tablet (150 mg total) by mouth daily. 07/24/14   Nyoka Cowden, MD  metFORMIN (GLUCOPHAGE) 500 MG tablet Take 1 tablet (500 mg total) by mouth 2 (two) times daily with a meal. 07/11/12   Clydia Llano, MD   Meds Ordered and Administered this Visit  Medications - No data to display  BP 171/78 mmHg  Pulse 88  Temp(Src) 98.3 F (36.8 C) (Oral)  Resp 16  SpO2 99% No data found.   Physical Exam  Constitutional: She is oriented to person, place, and time. She appears well-developed and well-nourished.  Neck: Normal range of motion. Neck supple.  Pulmonary/Chest: She has no wheezes.  Lymphadenopathy:    She has no cervical adenopathy.  Neurological: She is alert and oriented to person, place, and time.  Skin: Skin is warm and dry. Rash noted. There is erythema.  Pruritis and hives.  Nursing note and vitals reviewed.   ED Course  Procedures (including critical care time)  Labs Review Labs Reviewed - No data to display  Imaging Review No results found.   Visual Acuity Review  Right Eye Distance:  Left Eye Distance:   Bilateral Distance:    Right Eye Near:   Left Eye Near:    Bilateral Near:         MDM   1. Hives, physical   sent for medical eval of pt with recent h/o mult new meds for PE and HBP, seen in ER prev.     Linna Hoff, MD 09/01/14 [phone]

## 2014-09-01 NOTE — ED Notes (Signed)
Body rash since early august.  Reports her physicians have speculated what she is allergic to that she is taking.  Reports she is taking two new blood pressure medicines and a blood thinner since being hospitalized for PE.

## 2014-09-03 DIAGNOSIS — L509 Urticaria, unspecified: Secondary | ICD-10-CM | POA: Diagnosis not present

## 2014-09-03 DIAGNOSIS — Z719 Counseling, unspecified: Secondary | ICD-10-CM | POA: Diagnosis not present

## 2014-09-03 DIAGNOSIS — E669 Obesity, unspecified: Secondary | ICD-10-CM | POA: Diagnosis not present

## 2014-09-03 DIAGNOSIS — I2699 Other pulmonary embolism without acute cor pulmonale: Secondary | ICD-10-CM | POA: Diagnosis not present

## 2014-09-03 DIAGNOSIS — I10 Essential (primary) hypertension: Secondary | ICD-10-CM | POA: Diagnosis not present

## 2014-09-03 IMAGING — CR DG CHEST 2V
2 series · 2 of 2 positions shown · non-contrast
Comparison: PA and lateral chest 01/22/2012.

CLINICAL DATA: Dizziness.  Hyperglycemia.

CHEST - 2 VIEW

[w chest lat]
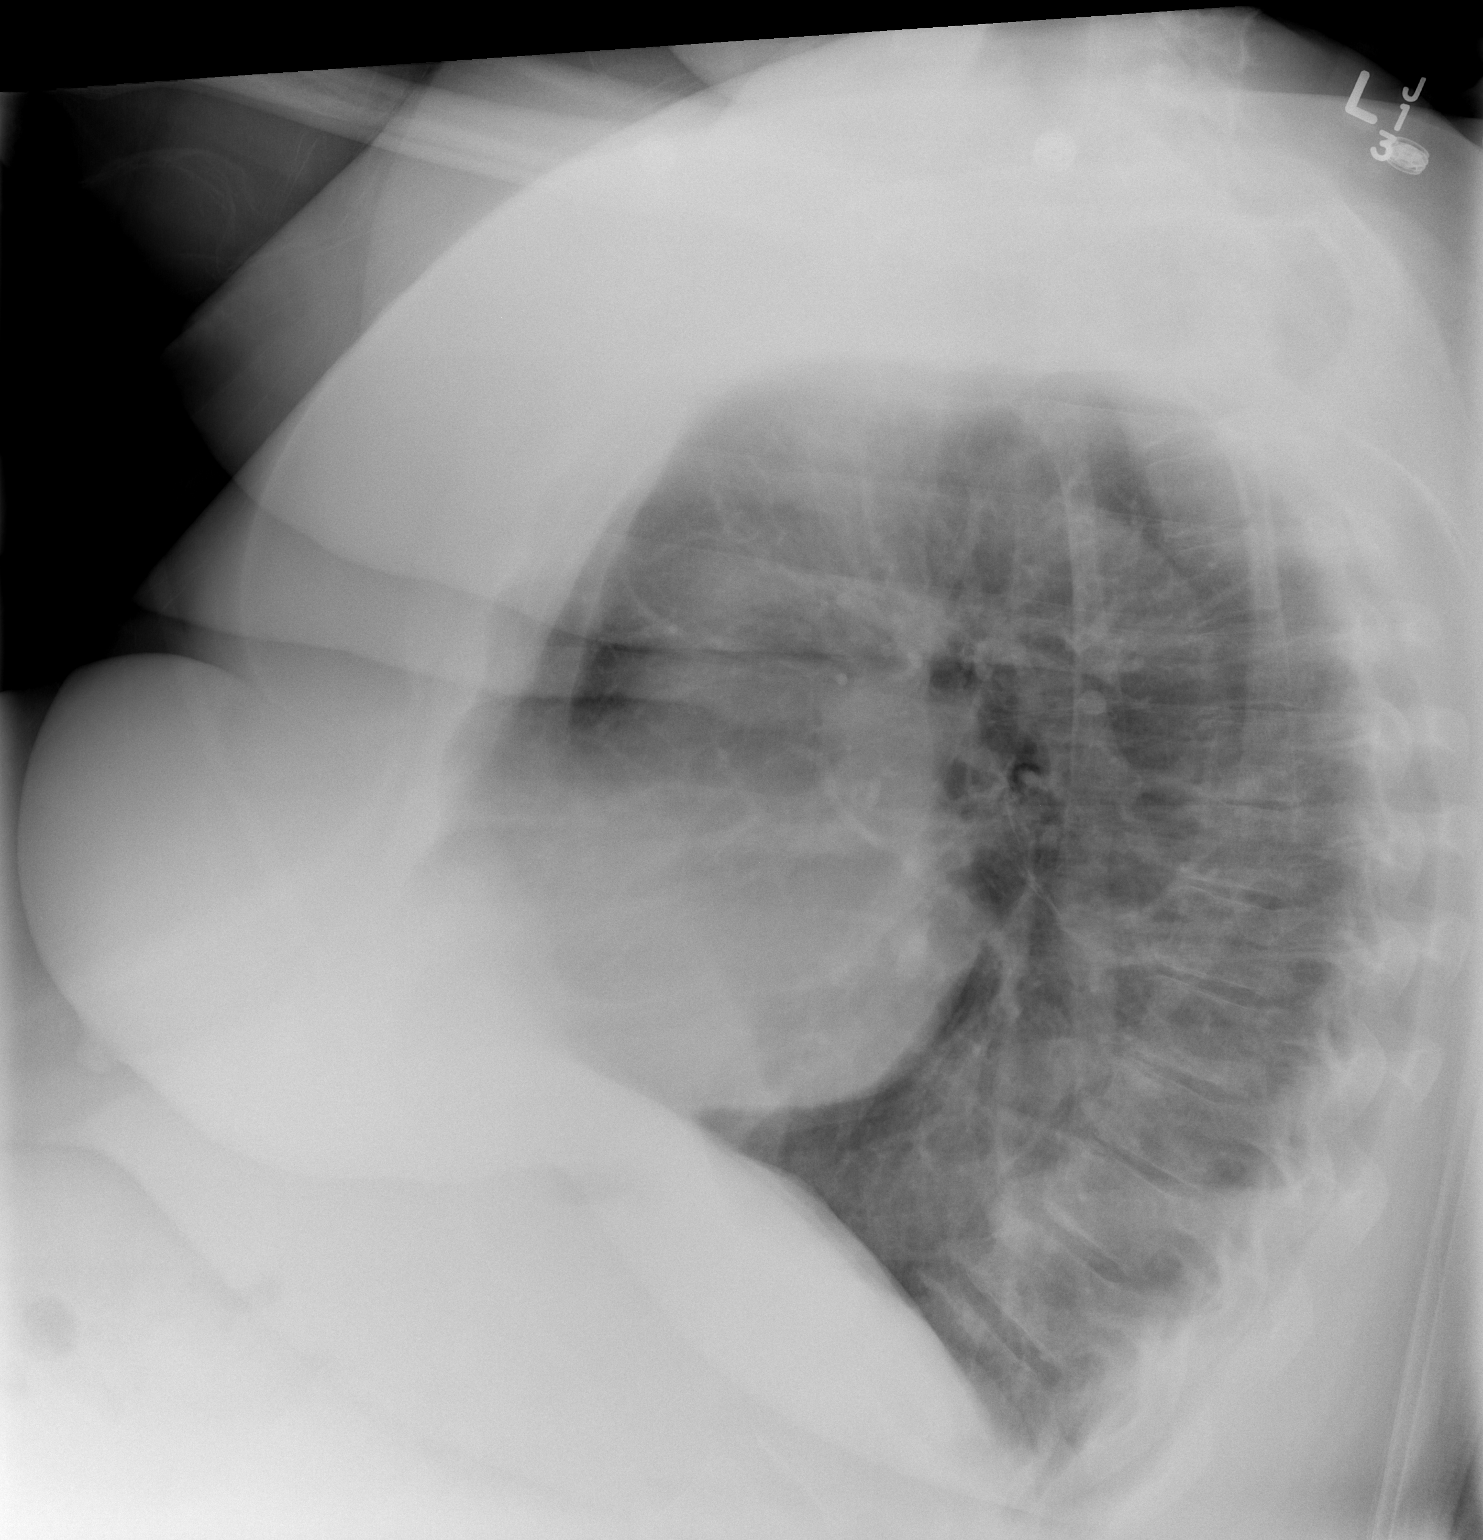

[w chest pa]
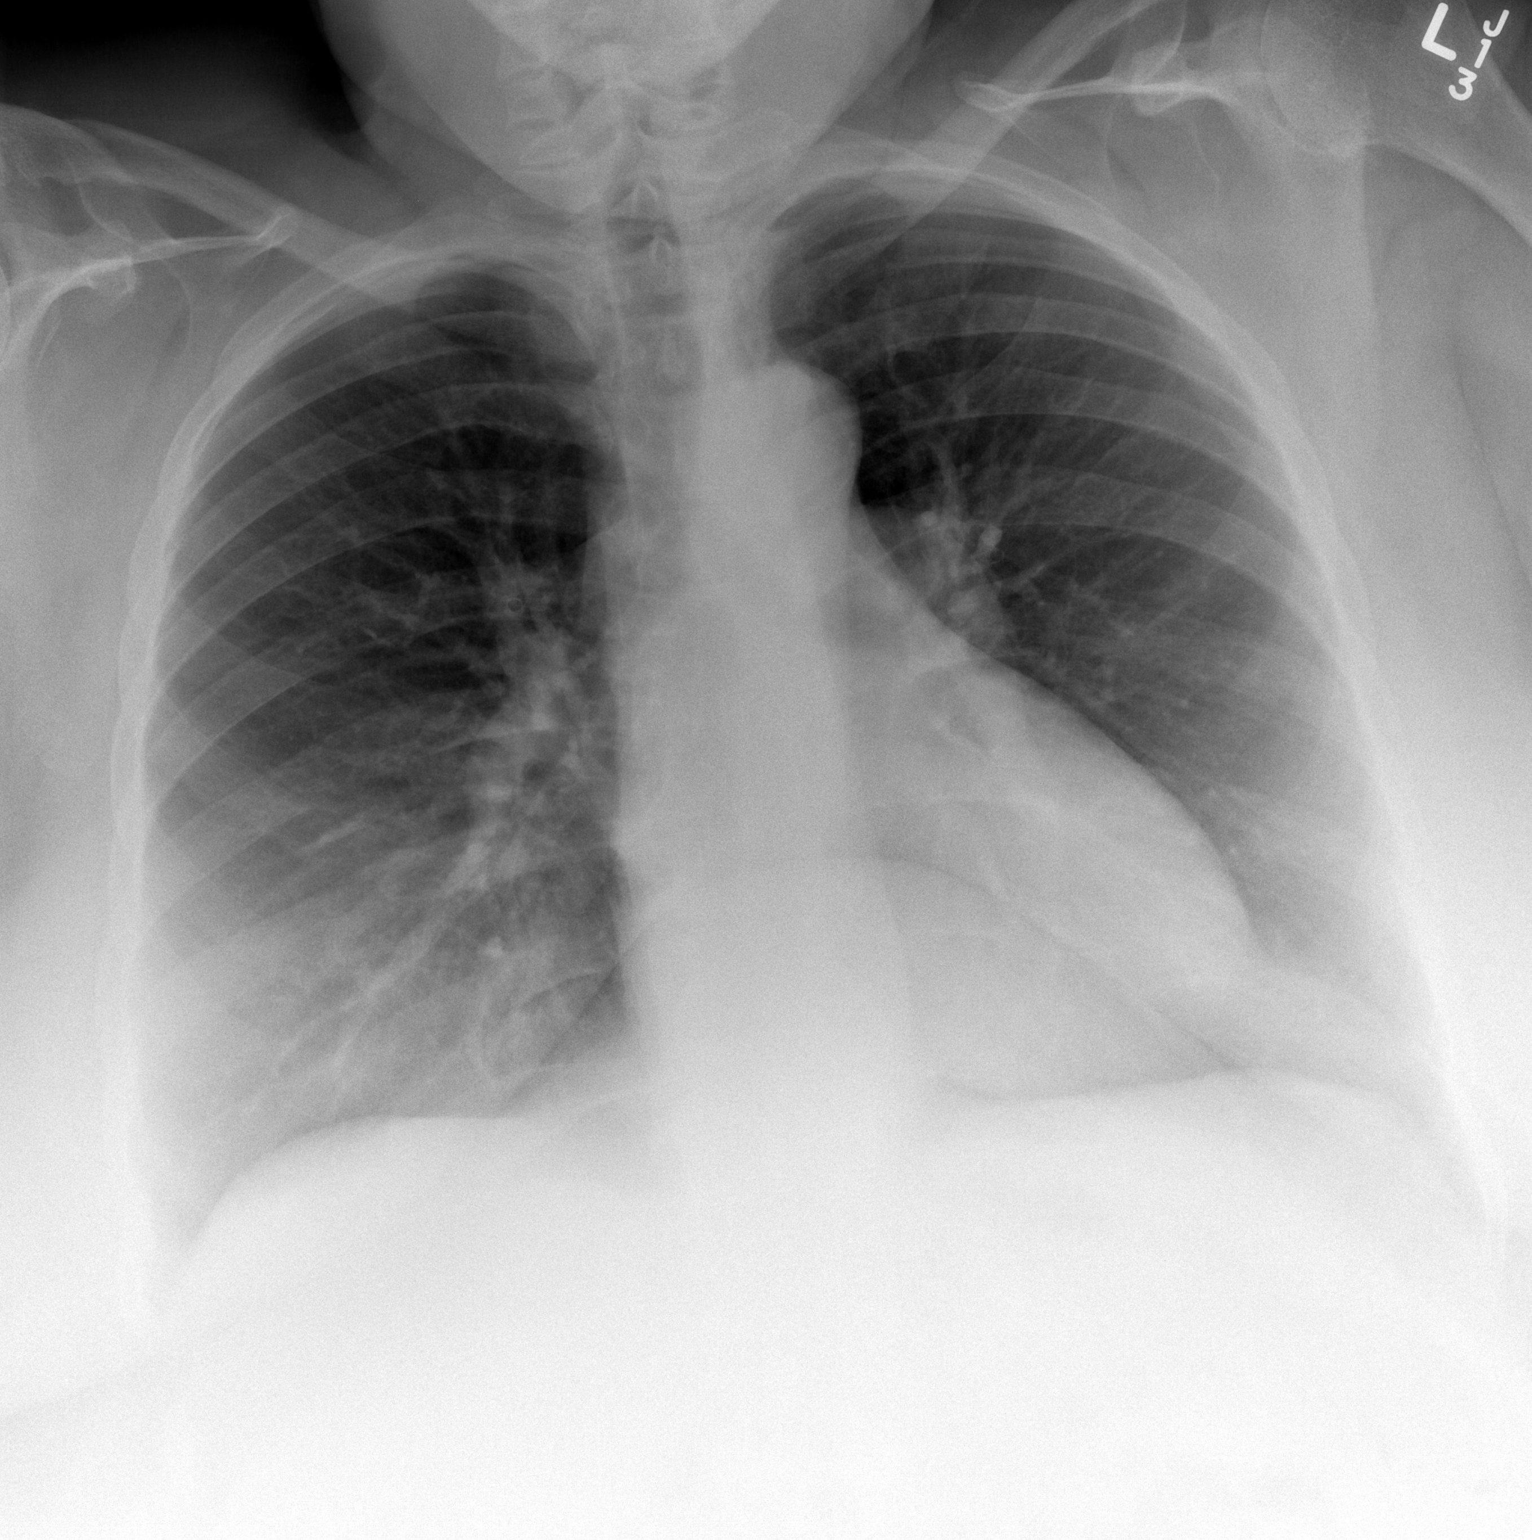

[2 of 2 positions shown; findings below may reference images not displayed]

FINDINGS: Lungs are clear.  Heart size is upper normal.  No
pneumothorax or pleural fluid.
IMPRESSION: No acute disease.

## 2014-11-25 ENCOUNTER — Other Ambulatory Visit: Payer: Self-pay | Admitting: Internal Medicine

## 2014-12-23 ENCOUNTER — Ambulatory Visit (INDEPENDENT_AMBULATORY_CARE_PROVIDER_SITE_OTHER): Payer: Medicare Other | Admitting: Internal Medicine

## 2014-12-23 ENCOUNTER — Encounter: Payer: Self-pay | Admitting: Internal Medicine

## 2014-12-23 VITALS — BP 124/86 | HR 89 | Ht 67.0 in | Wt 309.0 lb

## 2014-12-23 DIAGNOSIS — R0602 Shortness of breath: Secondary | ICD-10-CM

## 2014-12-23 DIAGNOSIS — I2699 Other pulmonary embolism without acute cor pulmonale: Secondary | ICD-10-CM | POA: Diagnosis not present

## 2014-12-23 NOTE — Patient Instructions (Addendum)
Please see patient coordinator before you leave today  to schedule echocardiogram and venous dopplers to see if all the clots are gone   Your main risk for future clotting is your weight so until / unless you start losing significant weight you should be on some form of anticoagulation   For itching > try benadryl and have your primary doctor refer you to dermatology if needed  You do have very mild asthma and should take bisoprolol instead of coreg but I will let Dr Marlyne BeardsJennings make this change and monitor your blood pressure going forward.  Pulmonary follow up is as needed

## 2014-12-23 NOTE — Progress Notes (Signed)
Subjective:    Patient ID: [name], female    DOB: [birth year]   MRN: [id number]     Brief patient profile:  64 yobf never smoker with breathing problems around 2008  eval by Dr Petra Kuba never told she had asthma but better p inhaler but stopped it then again in same problem 2014 eval at  [facility] maint on advair 250 and alb since then using it daily but ? 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    History of Present Illness  05/20/2013 1st [facility] office visit/ Karson Reede Chief Complaint  Patient presents with  . Pulmonary Consult    Referred per Dr. [name]. Pt c/o SOB for the past 2 years- esp when she first gets up in the am. She states also has non prod cough at night- cough drops help.     prev using saba up 2-3 daily but no need since stopped advair, still on ACEi and already on PPI daily  rec Leave off advair completely and the lisinopril and amlodipine and and hydroduril start tribenzor 40/10/25 one daily  Only use your albuterol as a rescue medication   Please schedule a follow up office visit in 6 weeks> did not return    05/13/2014 f/u ov/Nicodemus Denk re:  prob acei sob  Chief Complaint  Patient presents with  . Follow-up    Pt c/o increased SOB for the past 2-3 months. Starting to improve after started taking Dulera inhaler.   back on acei and indolent onset progressive sob at rest and with slow adls since restarted acei though denies assoc cough and better on dulera   >>change ACE /Norvasc to Azor     05/27/2014 Acute OV   Pt presents to office with complaints of  SOB (extreme) x 4 days; can't hardly take a few steps with issues Has had chest pains on/off for few days.  Pt required assistance in office and needed wheelchair .  Cough is improved. Seen 2 weeks ago for dyspnea and cough .  Changed off ACE and norvasc to AZOR  . Cough is better  .  Dyspnea got worse 4 days ago.  Chart review showed echo 2014 with decreased EF at 40-45%.  ? Old MI .  She is a DM .  EKG in office is irregular with worsening R wave progression compared to 2014 with bigemeny (new ) .  We placed her on O2 at 2l/m . EMS was called for transport to ER for further evaluation for possible cardiac issues .  O2 sats on arrival were normal on RA in wheelchair. B/p normal.  +orthopnea and edema.  rec admit > dx pe    Admit date: 05/27/2014 Discharge date: 06/02/2014  Recommendations for Outpatient Follow-up:  1. Follow-up of pulmonary 2 weeks. 2. With a skilled nursing facility we will need oxygen for the next few weeks. 3. Continue up Apixiban 10 mg by mouth twice a day for 12 days, then continue 5 mg by mouth twice a day.  Discharge Diagnoses:  Active Problems:  PE (pulmonary embolism)  Pulmonary embolism  Pulmonary emboli  Right heart enlargement    Filed Weights   05/31/14 0359 06/01/14 0405 06/02/14 0336  Weight: 135.626 kg (299 lb) 148.553 kg (327 lb 8 oz) 146.3 kg (322 lb 8.5 oz)    History of present illness:  64 y.o. F brought to [facility] ED 5/24 with severe SOB. Found to have PE with CT evidence of RHS. PCCM called for admission.   Hospital Course:  Pulmonary emboli leading to right heart enlargement: - CTA of the chest was done that showed a saddle PE with evidence of right heart strain. - PCCM was consulted/ given thrombolytics.  - started on IV heparin on admission was her shortness of breath improved she was transition to NOAC's Lower ext dopplers showed L DVT  Consult PT rec SNF. ECHO showed a depressed EF compared to previous EF 40 increase coreg & resume ARB. - Will need oxygen as an outpatient.  Thrombocytopenia: Most likely consumption coagulopathy due to PE, now resolved. Hypercoagulable panel showed no abnormalitties  Acute on Chronic renal disease stage III: - Improvement IV hydration after  lytics. - Most likely due to mild shock.   Leukocytosis: Has remained afebrile most likely stress emargination. Resolved  Controlled diabetes mellitus type 2: No changes made to her medication       06/26/2014 extended post hosp f/u ov/Sua Spadafora re: transition of care/ continued sob p PE/L DVT Chief Complaint  Patient presents with  . HFU    Breathing has improved some. She has not had to use her inhalers for the past 5 days. No new co's today.  Doe x more than room to room walking rec You need to return in 6 months (before stopping your blood thinning medication)- call sooner if you feel like you are losing ground with coughing or breathing Your main issue/ risk factor for recurrent clot  is your weight which needs to be addressed You will need to work out  formulary restrictions with your insurance company and Dr Hyman Hopes - try to make appt before your samples run out    07/24/2014 f/u ov/Keyton Bhat re: s/p PE/ ?pseudoasthma from acei vs asthma from coreg  Chief Complaint  Patient presents with  . Follow-up    Pt states developed rash over the last visit- went to ED for this on 07/21/14- hydrocortisone helps.    thoroughly confused with med instructions/ breathing fine unless gets out in heat / on coreg and off arb  rec Please see patient coordinator before you leave today to schedule internal medicine for treatment of your hypertension and blood clots  Stop dulera  Only use your albuterol as a rescue medication    12/23/2014  f/u ov/Valaria Kohut re: pseudoasthma/ morbid obesity  Chief Complaint  Patient presents with  . Follow-up    Pt states that her breathing has been worse for the past 2 wks- relates to the cold weather. She has not used her rescue inhaler.      No obvious day to day or daytime variabilty or assoc chronic cough or cp or chest tightness, subjective wheeze overt sinus or hb symptoms. No unusual exp hx or h/o childhood pna/ asthma or knowledge of premature birth.   Also denies  any obvious fluctuation of symptoms with weather or environmental changes or other aggravating or alleviating factors except as outlined above   Current Medications, Allergies, Complete Past Medical History, Past Surgical History, Family History, and Social History were reviewed in [employer] record.  ROS  The following are not active complaints unless bolded sore throat, dysphagia, dental problems, itching, sneezing,  nasal congestion or excess/ purulent secretions, ear ache,   fever, chills, sweats, unintended wt loss, pleuritic or exertional cp, hemoptysis,  orthopnea pnd or leg swelling, presyncope, palpitations, abdominal pain,  anorexia, nausea, vomiting, diarrhea  or change in bowel or urinary habits, change in stools or urine, dysuria,hematuria,  rash, arthralgias, visual complaints, headache, numbness weakness or ataxia or problems with walking or coordination,  change in mood/affect or memory.             Objective:   Physical Exam    amb obese nad but very difficult to follow re details of care/ meds  07/24/2014        307  > 12/23/2014    309     06/26/14 304 lb 3.2 oz (137.984 kg)  06/02/14 322 lb 8.5 oz (146.3 kg)  05/27/14 309 lb (140.161 kg)    Vital signs reviewed / note sats 97% RA   HEENT: nl dentition, turbinates, and orophanx. Nl external ear canals without cough reflex   NECK :  without JVD/Nodes/TM/ nl carotid upstrokes bilaterally   LUNGS: no acc muscle use, clear to A and P bilaterally without cough on insp or exp maneuvers   CV:  RRR no S3 or murmur, no sign edema   ABD:  soft and nontender with nl excursion in the supine position. No bruits or organomegaly, bowel sounds nl  MS:  warm without deformities, calf tenderness, cyanosis or clubbing  SKIN: warm and dry without lesions    NEURO:  alert, approp, no deficits    I personally reviewed images and agree with radiology impression as follows:  CXR:   05/29/14 No acute  abnormality noted.            Assessment & Plan:

## 2014-12-29 ENCOUNTER — Emergency Department (INDEPENDENT_AMBULATORY_CARE_PROVIDER_SITE_OTHER): Payer: Medicare Other

## 2014-12-29 ENCOUNTER — Telehealth: Payer: Self-pay | Admitting: Pulmonary Disease

## 2014-12-29 ENCOUNTER — Emergency Department (INDEPENDENT_AMBULATORY_CARE_PROVIDER_SITE_OTHER)
Admission: EM | Admit: 2014-12-29 | Discharge: 2014-12-29 | Disposition: A | Discharge status: Home / Self Care | Payer: Medicare Other | Source: Home / Self Care

## 2014-12-29 ENCOUNTER — Encounter (HOSPITAL_COMMUNITY): Payer: Self-pay

## 2014-12-29 DIAGNOSIS — R0601 Orthopnea: Secondary | ICD-10-CM

## 2014-12-29 NOTE — Telephone Encounter (Signed)
Ms. Sydnee CabalSolar called today noting that she has been having worsening dyspnea, orthopnea, and wheezing for the last several days not responsive to albuterol.  I recommended that she go to Urgent care or the ER.  Heber CarolinaBrent McQuaid, MD [place] [facility] Pager: [phone] Cell: [phone] After 3pm or if no response, call [phone]

## 2014-12-29 NOTE — ED Provider Notes (Signed)
CSN: [id number]     Arrival date & time 12/29/14  1752 History   None    Chief Complaint  Patient presents with  . Shortness of Breath   (Consider location/radiation/quality/duration/timing/severity/associated sxs/prior Treatment) HPI Pt states that over the last few weeks she finds that she has to get up to sleep because she gets short of breath when lying in bed. She states that she has noticed this more since her PE in May.  States that she does not have any swelling of her feet and ankles. Has been eating holiday foods, which she states may not be good for her. Also wants to stop her Xarelto because it causes her to itch.  She also states that she starts to wheeze at night, but inhaler not helping.  Past Medical History  Diagnosis Date  . Osteoarthritis   . Hypertension   . Morbid obesity (HCC)   . Diabetes mellitus without complication [facility])    Past Surgical History  Procedure Laterality Date  . Cesarean section      x 3   Family History  Problem Relation Age of Onset  . Hypertension Mother   . Allergies Daughter   . Allergies Sister   . Asthma Daughter    Social History  Substance Use Topics  . Smoking status: Never Smoker   . Smokeless tobacco: Never Used  . Alcohol Use: No   OB History    No data available     Review of Systems ROS +'ve shortness of breath while supine.   Denies: HEADACHE, NAUSEA, ABDOMINAL PAIN, CHEST PAIN, CONGESTION, DYSURIA   Allergies  Review of patient's allergies indicates no known allergies.  Home Medications   Prior to Admission medications   Medication Sig Start Date End Date Taking? Authorizing Provider  albuterol (PROVENTIL HFA;VENTOLIN HFA) 108 (90 BASE) MCG/ACT inhaler Inhale 1-2 puffs into the lungs every 6 (six) hours as needed for wheezing. 01/22/12   Nelva Nay, MD  carvedilol (COREG) 12.5 MG tablet TAKE 1 TABLET (12.5 MG TOTAL) BY MOUTH 2 (TWO) TIMES DAILY WITH A MEAL. 11/25/14   Nyoka Cowden, MD  glimepiride  (AMARYL) 4 MG tablet Take 4 mg by mouth daily with breakfast.    Historical Provider, MD  irbesartan (AVAPRO) 300 MG tablet Take 300 mg by mouth daily.    Historical Provider, MD  metFORMIN (GLUCOPHAGE) 500 MG tablet Take 1 tablet (500 mg total) by mouth 2 (two) times daily with a meal. 07/11/12   Clydia Llano, MD  triamcinolone (KENALOG) 0.025 % ointment Apply 1 application topically 2 (two) times daily.    Historical Provider, MD  XARELTO 15 MG TABS tablet Take 1 tablet by mouth daily. 12/03/14   Historical Provider, MD   Meds Ordered and Administered this Visit  Medications - No data to display  BP 138/108 mmHg  Pulse 96  Temp(Src) 97.4 F (36.3 C) (Oral)  Resp 18  SpO2 98% No data found.   Physical Exam  Constitutional: She is oriented to person, place, and time. She appears well-developed and well-nourished.  HENT:  Head: Normocephalic and atraumatic.  Right Ear: External ear normal.  Nose: Nose normal.  Mouth/Throat: Oropharynx is clear and moist.  Eyes: Conjunctivae are normal.  Neck: Normal range of motion. Neck supple.  Cardiovascular: Normal rate and normal heart sounds.   Pulmonary/Chest: Effort normal and breath sounds normal. She has no wheezes.  Neurological: She is alert and oriented to person, place, and time.  Skin: Skin is warm  and dry.  Psychiatric: She has a normal mood and affect. Her behavior is normal. Judgment normal.  Nursing note and vitals reviewed.   ED Course  Procedures (including critical care time)  Labs Review Labs Reviewed - No data to display  Imaging Review Dg Chest 2 View  12/29/2014  CLINICAL DATA:  64 year old female with shortness of breath for the past 2 weeks EXAM: CHEST  2 VIEW COMPARISON:  Prior chest x-ray 05/29/2014 FINDINGS: Cardiomegaly with left heart enlargement similar compared to prior. No pulmonary edema, pleural effusion or pneumothorax. Trace atherosclerotic calcifications present in the transverse aorta. The lungs  remain mildly hyperinflated. No acute osseous abnormality. IMPRESSION: No active cardiopulmonary disease. Electronically Signed   By: Malachy MoanHeath  McCullough M.D.   On: 12/29/2014 20:10     Visual Acuity Review  Right Eye Distance:   Left Eye Distance:   Bilateral Distance:    Right Eye Near:   Left Eye Near:    Bilateral Near:         MDM   1. Sleeps in sitting position due to orthopnea    Advices given the patient that she should probably the head of her bed up. It isn't vitally important that she see her primary care provider in the next couple of days for evaluation of her medication and to assist in management of her orthopnea. No new medications are started at this time. She has not had any chest pain. I also discussed with patient that she may have a cardiac wheeze which is not treated with bronchodilators.   I have reviewed chest x-ray with the patient and there is mild cardiomegaly which is essentially unchanged from previous exams. There is no signs of failure or pleural effusions noted. Patient also wanted to have a discussion about the use of Xarelto. Patient had a large pulmonary embolus back in May she states that the Xarelto is causing her to itch and she wants to stop the medication. I have advised patient that that would not be wise in her best interest. He states that she will talk to her primary care provider about stopping this medication.  Patient states that she will try to exercise more upper bed up at night with her primary care provider. Instructions of care provided discharged home in stable condition.  Has echocardiogram scheduled for 12/28.    Tharon AquasFrank C Corleone Biegler, PA 12/29/14 2051  Tharon AquasFrank C Glyndon Tursi, PA 12/31/14 [phone]

## 2014-12-29 NOTE — ED Notes (Signed)
C/o SOB x 2 weeks, has an appointment in AM to see her MD

## 2014-12-29 NOTE — Discharge Instructions (Signed)

## 2014-12-30 ENCOUNTER — Ambulatory Visit (HOSPITAL_COMMUNITY)
Admission: RE | Admit: 2014-12-30 | Discharge: 2014-12-30 | Disposition: A | Discharge status: Home / Self Care | Payer: Medicare Other | Source: Ambulatory Visit | Attending: Cardiovascular Disease | Admitting: Cardiovascular Disease

## 2014-12-30 DIAGNOSIS — Z6841 Body Mass Index (BMI) 40.0 and over, adult: Secondary | ICD-10-CM | POA: Insufficient documentation

## 2014-12-30 DIAGNOSIS — I10 Essential (primary) hypertension: Secondary | ICD-10-CM | POA: Insufficient documentation

## 2014-12-30 DIAGNOSIS — I2699 Other pulmonary embolism without acute cor pulmonale: Secondary | ICD-10-CM | POA: Diagnosis not present

## 2014-12-30 DIAGNOSIS — E119 Type 2 diabetes mellitus without complications: Secondary | ICD-10-CM | POA: Diagnosis not present

## 2014-12-30 NOTE — Assessment & Plan Note (Addendum)
-   07/24/2014  Walked RA x 2 laps @ 185 ft each stopped due to mild sob/fatigue, no desat - spirometry 07/24/2014 s obst "on dulera prn" with very poor hfa so rec d/c dulera and just use saba prn  - Spirometry 12/23/2014   FEV1  1.26 (57%) ratio 63 (though tracing really not physiologic on the effort dep portion) rec trial off coreg and on bisoprolol before trying inhalers again   She does appear to have an asthmatic component but her hfa technique is so poor I rec she first try on a more specific BB than coreg to see to what extent this helps and in meantime just continue to use the saba prn   I had an extended discussion with the patient reviewing all relevant studies completed to date and  lasting 15 to 20 minutes of a 25 minute visit    Each maintenance medication was reviewed in detail including most importantly the difference between maintenance and prns and under what circumstances the prns are to be triggered using an action plan format that is not reflected in the computer generated alphabetically organized AVS.    Please see instructions for details which were reviewed in writing and the patient given a copy highlighting the part that I personally wrote and discussed at today's ov.

## 2014-12-30 NOTE — Assessment & Plan Note (Signed)
Dx 05/27/14 with RV strain > IR directed thrombolysis - Venous dopplers 05/29/14 > pos on Left - Echo 05/30/14 moderately to severely dilated. Systolic function was moderately to severely reduced - 06/26/14    Walked RA x one lap @ 185 stopped due to  Fatigue, no sob or desat at slow pace  - [facility]  [facility] @ [facility] ft each stopped due to  Ryland GroupSob /fatigue/ no desats at moderate pace  - 12/23/2014 rec repeat venous dopplers/ echo before stopping her NOACs

## 2014-12-30 NOTE — Assessment & Plan Note (Signed)
Body mass index is 48.38 kg/(m^2).  Lab Results  Component Value Date   TSH 0.762   07/03/2009     Contributing to  Orthopnea/ doe/reviewed the need and the process to achieve and maintain neg calorie balance > defer f/u primary care including intermittently monitoring thyroid status    This also appears to be the main risk factor for recurrent dvt/ pe/ advised

## 2014-12-31 ENCOUNTER — Ambulatory Visit (INDEPENDENT_AMBULATORY_CARE_PROVIDER_SITE_OTHER): Payer: Medicare Other | Admitting: Internal Medicine

## 2014-12-31 ENCOUNTER — Other Ambulatory Visit (INDEPENDENT_AMBULATORY_CARE_PROVIDER_SITE_OTHER): Payer: Medicare Other

## 2014-12-31 ENCOUNTER — Encounter: Payer: Self-pay | Admitting: Internal Medicine

## 2014-12-31 VITALS — BP 132/96 | HR 99 | Ht 67.0 in | Wt 312.2 lb

## 2014-12-31 DIAGNOSIS — I2699 Other pulmonary embolism without acute cor pulmonale: Secondary | ICD-10-CM

## 2014-12-31 DIAGNOSIS — R0602 Shortness of breath: Secondary | ICD-10-CM

## 2014-12-31 DIAGNOSIS — I10 Essential (primary) hypertension: Secondary | ICD-10-CM

## 2014-12-31 LAB — CBC WITH DIFFERENTIAL/PLATELET
BASOS PCT: 0.4 % (ref 0.0–3.0)
Basophils Absolute: 0 10*3/uL (ref 0.0–0.1)
EOS PCT: 1.2 % (ref 0.0–5.0)
Eosinophils Absolute: 0.1 10*3/uL (ref 0.0–0.7)
HEMATOCRIT: 33.2 % — AB (ref 36.0–46.0)
Hemoglobin: 10.6 g/dL — ABNORMAL LOW (ref 12.0–15.0)
LYMPHS PCT: 16.7 % (ref 12.0–46.0)
Lymphs Abs: 1.7 10*3/uL (ref 0.7–4.0)
MCHC: 31.7 g/dL (ref 30.0–36.0)
MCV: 78 fl (ref 78.0–100.0)
Monocytes Absolute: 0.5 10*3/uL (ref 0.1–1.0)
Monocytes Relative: 4.8 % (ref 3.0–12.0)
Neutro Abs: 7.7 10*3/uL (ref 1.4–7.7)
Neutrophils Relative %: 76.9 % (ref 43.0–77.0)
Platelets: 326 10*3/uL (ref 150.0–400.0)
RBC: 4.26 Mil/uL (ref 3.87–5.11)
RDW: 19.6 % — AB (ref 11.5–15.5)
WBC: 9.9 10*3/uL (ref 4.0–10.5)

## 2014-12-31 LAB — BASIC METABOLIC PANEL
BUN: 24 mg/dL — ABNORMAL HIGH (ref 6–23)
CALCIUM: 9.7 mg/dL (ref 8.4–10.5)
CHLORIDE: 105 meq/L (ref 96–112)
CO2: 28 meq/L (ref 19–32)
Creatinine, Ser: 0.96 mg/dL (ref 0.40–1.20)
GFR: 75.09 mL/min (ref >60.00)
GLUCOSE: 75 mg/dL (ref 70–99)
Potassium: 3.7 mEq/L (ref 3.5–5.1)
SODIUM: 142 meq/L (ref 135–145)

## 2014-12-31 LAB — TSH: TSH: 0.76 u[IU]/mL (ref 0.35–4.50)

## 2014-12-31 LAB — BRAIN NATRIURETIC PEPTIDE: Pro B Natriuretic peptide (BNP): 667 pg/mL — ABNORMAL HIGH (ref 0.0–100.0)

## 2014-12-31 MED ORDER — FAMOTIDINE 20 MG PO TABS: ORAL_TABLET | ORAL | Status: DC

## 2014-12-31 MED ORDER — PANTOPRAZOLE SODIUM 40 MG PO TBEC: 40.0000 mg | DELAYED_RELEASE_TABLET | Freq: Every day | ORAL | Status: AC

## 2014-12-31 NOTE — Patient Instructions (Addendum)
Keep your appt for echocardiogram   bystolic 10 mg daily until new pill   Work on inhaler technique:  relax and gently blow all the way out then take a nice smooth deep breath back in, triggering the inhaler at same time you start breathing in.  Hold for up to 5 seconds if you can. Blow out thru nose. Rinse and gargle with water when done  Only use your albuterol as a rescue medication to be used if you can't catch your breath by resting or doing a relaxed purse lip breathing pattern.  - The less you use it, the better it will work when you need it. - Ok to use up to 2 puffs  every 4 hours if you must but call for immediate appointment if use goes up over your usual need - Don't leave home without it !!  (think of it like the spare tire for your car)      Pantoprazole (protonix) 40 mg   Take  30-60 min before first meal of the day and Pepcid (famotidine)  20 mg one @  bedtime until return to office - this is the best way to tell whether stomach acid is contributing to your problem.    GERD (REFLUX)  is an extremely common cause of respiratory symptoms just like yours , many times with no obvious heartburn at all.    It can be treated with medication, but also with lifestyle changes including elevation of the head of your bed (ideally with 6 inch  bed blocks),  Smoking cessation, avoidance of late meals, excessive alcohol, and avoid fatty foods, chocolate, peppermint, colas, red wine, and acidic juices such as orange juice.  NO MINT OR MENTHOL PRODUCTS SO NO COUGH DROPS  USE SUGARLESS CANDY INSTEAD (Jolley ranchers or Stover's or Life Savers) or even ice chips will also do - the key is to swallow to prevent all throat clearing. NO OIL BASED VITAMINS - use powdered substitutes.  Please remember to go to the lab  department downstairs for your tests - we will call you with the results when they are available.        Pulmonary follow up is as needed - if need to return for any reason bring all  active medications with you and do the same for your primary doctor

## 2014-12-31 NOTE — Progress Notes (Signed)
Subjective:    Patient ID: [name], female    DOB: [birth year]   MRN: [id number]     Brief patient profile:  58 yobf never smoker with breathing problems around 2008  eval by Dr Petra Kuba never told she had asthma but better p inhaler but stopped it then again in same problem 2014 eval at  [facility] maint on advair 250 and alb since then using it daily but ? 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    History of Present Illness  05/20/2013 1st [place] [facility] office visit/ Niccolas Loeper Chief Complaint  Patient presents with  . Pulmonary Consult    Referred per Dr. [name]. Pt c/o SOB for the past 2 years- esp when she first gets up in the am. She states also has non prod cough at night- cough drops help.     prev using saba up 2-3 daily but no need since stopped advair, still on ACEi and already on PPI daily  rec Leave off advair completely and the lisinopril and amlodipine and and hydroduril start tribenzor 40/10/25 one daily  Only use your albuterol as a rescue medication   Please schedule a follow up office visit in 6 weeks> did not return    05/13/2014 f/u ov/Germani Gavilanes re:  prob acei sob  Chief Complaint  Patient presents with  . Follow-up    Pt c/o increased SOB for the past 2-3 months. Starting to improve after started taking Dulera inhaler.   back on acei and indolent onset progressive sob at rest and with slow adls since restarted acei though denies assoc cough and better on dulera   >>change ACE /Norvasc to Azor     05/27/2014 Acute OV   Pt presents to office with complaints of  SOB (extreme) x 4 days; can't hardly take a few steps with issues Has had chest pains on/off for few days.  Pt required assistance in office and needed wheelchair .  Cough is improved. Seen 2 weeks ago for dyspnea and cough .  Changed off ACE and norvasc to AZOR  . Cough is better  .  Dyspnea got worse 4 days ago.  Chart review showed echo 2014 with decreased EF at 40-45%.  ? Old MI .  She is a DM .  EKG in office is irregular with worsening R wave progression compared to 2014 with bigemeny (new ) .  We placed her on O2 at 2l/m . EMS was called for transport to ER for further evaluation for possible cardiac issues .  O2 sats on arrival were normal on RA in wheelchair. B/p normal.  +orthopnea and edema.  rec admit > dx pe    Admit date: 05/27/2014 Discharge date: 06/02/2014  Recommendations for Outpatient Follow-up:  1. Follow-up of pulmonary 2 weeks. 2. With a skilled nursing facility we will need oxygen for the next few weeks. 3. Continue up Apixiban 10 mg by mouth twice a day for 12 days, then continue 5 mg by mouth twice a day.  Discharge Diagnoses:  Active Problems:  PE (pulmonary embolism)  Pulmonary embolism  Pulmonary emboli  Right heart enlargement    Filed Weights   05/31/14 0359 06/01/14 0405 06/02/14 0336  Weight: 135.626 kg (299 lb) 148.553 kg (327 lb 8 oz) 146.3 kg (322 lb 8.5 oz)    History of present illness:  64 y.o. F brought [facility] ED 5/24 with severe SOB. Found to have PE with CT evidence of RHS. PCCM called for admission.   Hospital Course:  Pulmonary emboli leading to right heart enlargement: - CTA of the chest was done that showed a saddle PE with evidence of right heart strain. - PCCM was consulted/ given thrombolytics.  - started on IV heparin on admission was her shortness of breath improved she was transition to NOAC's Lower ext dopplers showed L DVT  Consult PT rec SNF. ECHO showed a depressed EF compared to previous EF 40 increase coreg & resume ARB. - Will need oxygen as an outpatient.  Thrombocytopenia: Most likely consumption coagulopathy due to PE, now resolved. Hypercoagulable panel showed no abnormalitties  Acute on Chronic renal disease stage III: - Improvement IV hydration after  lytics. - Most likely due to mild shock.   Leukocytosis: Has remained afebrile most likely stress emargination. Resolved  Controlled diabetes mellitus type 2: No changes made to her medication       06/26/2014 extended post hosp f/u ov/Woody Kronberg re: transition of care/ continued sob p PE/L DVT Chief Complaint  Patient presents with  . HFU    Breathing has improved some. She has not had to use her inhalers for the past 5 days. No new co's today.  Doe x more than room to room walking rec You need to return in 6 months (before stopping your blood thinning medication)- call sooner if you feel like you are losing ground with coughing or breathing Your main issue/ risk factor for recurrent clot  is your weight which needs to be addressed You will need to work out  formulary restrictions with your insurance company and Dr Hyman Hopes - try to make appt before your samples run out    07/24/2014 f/u ov/Kees Idrovo re: s/p PE/ ?pseudoasthma from acei vs asthma from coreg  Chief Complaint  Patient presents with  . Follow-up    Pt states developed rash over the last visit- went to ED for this on 07/21/14- hydrocortisone helps.    thoroughly confused with med instructions/ breathing fine unless gets out in heat / on coreg and off arb  rec Please see patient coordinator before you leave today to schedule internal medicine for treatment of your hypertension and blood clots  Stop dulera  Only use your albuterol as a rescue medication    12/23/2014  f/u ov/Jeremiyah Cullens re: pseudoasthma/ morbid obesity  Chief Complaint  Patient presents with  . Follow-up    Pt states that her breathing has been worse for the past 2 wks- relates to the cold weather. She has not used her rescue inhaler.   rec Please see patient coordinator before you leave today  to schedule echocardiogram and venous dopplers to see if all the clots are gone  Your main risk for future clotting is your weight so until / unless you start losing significant  weight you should be on some form of anticoagulation  For itching > try benadryl and have your primary doctor refer you to dermatology if needed You do have very mild asthma and should take bisoprolol instead of coreg but I will let Dr Marlyne Beards make this change and monitor your blood pressure going forward.     12/31/2014 acute extended ov/Lekendrick Alpern re: new orthopnea / has not taken lasix or new bp med yet Chief Complaint  Patient presents with  . Acute Visit    Pt c/o increased wheezing esp at night for the  past wk. She feels SOB when she lies down and has to sleep propped up. She is using rescue inhaler 5-6 x per day.   was able to lie flat s wheezing until 1 week prior to OV   but then started with noct " wheezing"  s much cough at all  Last used saba 730 am (3 h prior to ov p exerted ) but does not feel it helps (hfa poor) did not  take coreg am of ov and plans to pick up "soon"  Very disorganized and confused with details of care esp re meds    No obvious pattterns  day to day or daytime variabilty or assoc excess/ purulent sputum or mucus plugs   or cp or chest tightness, subjective wheeze overt sinus or hb symptoms. No unusual exp hx or h/o childhood pna/ asthma or knowledge of premature birth.   Also denies any obvious fluctuation of symptoms with weather or environmental changes or other aggravating or alleviating factors except as outlined above   Current Medications, Allergies, Complete Past Medical History, Past Surgical History, Family History, and Social History were reviewed in [employer] record.  ROS  The following are not active complaints unless bolded sore throat, dysphagia, dental problems, itching, sneezing,  nasal congestion or excess/ purulent secretions, ear ache,   fever, chills, sweats, unintended wt loss, pleuritic or exertional cp, hemoptysis,  Orthopnea  pnd or leg swelling, presyncope, palpitations, abdominal pain, anorexia, nausea, vomiting,  diarrhea  or change in bowel or urinary habits, change in stools or urine, dysuria,hematuria,  rash, arthralgias, visual complaints, headache, numbness weakness or ataxia or problems with walking or coordination,  change in mood/affect or memory.             Objective:   Physical Exam    amb obese nad    07/24/2014        307  > 12/23/2014    309 > 12/31/2014  312     06/26/14 304 lb 3.2 oz (137.984 kg)  06/02/14 322 lb 8.5 oz (146.3 kg)  05/27/14 309 lb (140.161 kg)    Vital signs reviewed  / HBP noted off meds  HEENT: nl dentition, turbinates, and orophanx. Nl external ear canals without cough reflex   NECK :  without JVD/Nodes/TM/ nl carotid upstrokes bilaterally   LUNGS: no acc muscle use, clear to A and P bilaterally without cough on insp or exp maneuvers   CV:  RRR no S3 or murmur,  1-2 + pitting bilateral lower ext  edema   ABD:  soft and nontender with nl excursion in the supine position. No bruits or organomegaly, bowel sounds nl  MS:  warm without deformities, calf tenderness, cyanosis or clubbing  SKIN: warm and dry without lesions    NEURO:  alert, approp, no deficits      I personally reviewed images and agree with radiology impression as follows:  CXR: 12/29/14 Cardiomegaly with left heart enlargement similar compared to prior. No pulmonary edema, pleural effusion or pneumothorax. Trace atherosclerotic calcifications present in the transverse aorta. The lungs remain mildly hyperinflated. No acute osseous abnormality.   Labs ordered/ reviewed:     Chemistry      Component Value Date/Time   NA 142 12/31/2014 1111   K 3.7 12/31/2014 1111   CL 105 12/31/2014 1111   CO2 28 12/31/2014 1111   BUN 24* 12/31/2014 1111   CREATININE 0.96 12/31/2014 1111      Component Value Date/Time  CALCIUM 9.7 12/31/2014 1111   ALKPHOS 124 07/21/2014 1132   AST 24 07/21/2014 1132   ALT 20 07/21/2014 1132   BILITOT 0.4 07/21/2014 1132        Lab Results    Component Value Date   WBC 9.9 12/31/2014   HGB 10.6* 12/31/2014   HCT 33.2* 12/31/2014   MCV 78.0 12/31/2014   PLT 326.0 12/31/2014       Lab Results  Component Value Date   TSH 0.76 12/31/2014     Lab Results  Component Value Date   PROBNP 667.0* 12/31/2014            Assessment & Plan:

## 2015-01-01 ENCOUNTER — Encounter: Payer: Self-pay | Admitting: Internal Medicine

## 2015-01-01 NOTE — Assessment & Plan Note (Addendum)
-   07/24/2014  Walked RA x 2 laps @ 185 ft each stopped due to mild sob/fatigue, no desat - spirometry 07/24/2014 s obst "on dulera prn" with very poor hfa so rec d/c dulera and just use saba prn  - Spirometry 12/23/2014   FEV1  1.26 (57%) ratio 63  tracing really not physiologic on the effort dep portion >>rec trial off coreg and on bisoprolol before trying inhalers again  - Spirometry 12/31/2014   FEV1  1.23 (55%) ratio 81 w/in 3 h of saba but with active symptoms of sob  - 12/31/2014   Walked RA x one lap @ 185 stopped due to  Sob slow pace, no desat   Symptoms continue  markedly disproportionate to objective findings and not clear this is a lung problem but pt does appear to have difficult airway management issues.   DDX of  difficult airways management all start with A and  include Adherence, Ace Inhibitors, Acid Reflux, Active Sinus Disease, Alpha 1 Antitripsin deficiency, Anxiety masquerading as Airways dz,  ABPA,  allergy(esp in young), Aspiration (esp in elderly), Adverse effects of meds,  Active smokers, A bunch of PE's (a small clot burden can't cause this syndrome unless there is already severe underlying pulm or vascular dz with poor reserve) plus two Bs  = Bronchiectasis and Beta blocker use..and one C= CHF   Adherence is always the initial "prime suspect" and is a multilayered concern that requires a "trust but verify" approach in every patient - starting with knowing how to use medications, especially inhalers, correctly, keeping up with refills and understanding the fundamental difference between maintenance and prns vs those medications only taken for a very short course and then stopped and not refilled.  - very disorganized with meds/ need a trust but verify approach here > if returns need to do so with all active meds in hand  - - The proper method of use, as well as anticipated side effects, of a metered-dose inhaler are discussed and demonstrated to the patient. Improved  effectiveness after extensive coaching during this visit to a level of approximately 75 % from a baseline of 25 %   ? Acid (or non-acid) GERD > always difficult to exclude as up to 75% of pts in some series report no assoc GI/ Heartburn symptoms> rec max (24h)  acid suppression and diet restrictions/ reviewed and instructions given in writing.   ? Allergy/ asthma > difficult to rule out since pfts today p saba so ok to continue prn though feel this is very unlikely explanation for her sob  ? BB > given bystolic 10 mg daily until picks up rx from drugstore   ? CHF > def has component of vol overload today and poor controlled hbp > perfect set up for diastolic dysfunction    For now rec focus on rx of hbp   I had an extended discussion with the patient reviewing all relevant studies completed to date and  lasting 30 minutes of a 45 minute acute extended visit    Each maintenance medication was reviewed in detail including most importantly the difference between maintenance and prns and under what circumstances the prns are to be triggered using an action plan format that is not reflected in the computer generated alphabetically organized AVS.    Please see instructions for details which were reviewed in writing and the patient given a copy highlighting the part that I personally wrote and discussed at today's ov.

## 2015-01-01 NOTE — Assessment & Plan Note (Signed)
Dx 05/27/14 with RV strain > IR directed thrombolysis - Venous dopplers 05/29/14 > pos on Left > repeat 12/30/14 wnl  - Echo 05/30/14 moderately to severely dilated. Systolic function was moderately to severely reduced - 06/26/14    Walked RA x one lap @ 185 stopped due to  Fatigue, no sob or desat at slow pace  - [facility] @ [facility] ft each stopped due to  [facility] GroupSob /fatigue/ no desats at moderate pace  - 12/30/2014   repeat venous dopplers neg - repeat Echo 12/31/2014 >>>  Await repeat echo but suspect until loses wt the risk/benefit favors at least low dose eliquis life long

## 2015-01-01 NOTE — Assessment & Plan Note (Signed)
Not Adequate control on present rx, reviewed > no change in rx needed , just needs to take meds as precribed

## 2015-01-07 ENCOUNTER — Ambulatory Visit (HOSPITAL_COMMUNITY): Payer: Medicare Other | Attending: Cardiology

## 2015-01-07 ENCOUNTER — Other Ambulatory Visit: Payer: Self-pay

## 2015-01-07 DIAGNOSIS — Z8249 Family history of ischemic heart disease and other diseases of the circulatory system: Secondary | ICD-10-CM | POA: Diagnosis not present

## 2015-01-07 DIAGNOSIS — I358 Other nonrheumatic aortic valve disorders: Secondary | ICD-10-CM | POA: Diagnosis not present

## 2015-01-07 DIAGNOSIS — I517 Cardiomegaly: Secondary | ICD-10-CM | POA: Diagnosis not present

## 2015-01-07 DIAGNOSIS — I2699 Other pulmonary embolism without acute cor pulmonale: Secondary | ICD-10-CM | POA: Diagnosis not present

## 2015-01-07 DIAGNOSIS — R0600 Dyspnea, unspecified: Secondary | ICD-10-CM | POA: Insufficient documentation

## 2015-01-07 DIAGNOSIS — I340 Nonrheumatic mitral (valve) insufficiency: Secondary | ICD-10-CM | POA: Insufficient documentation

## 2015-01-07 DIAGNOSIS — I10 Essential (primary) hypertension: Secondary | ICD-10-CM | POA: Diagnosis not present

## 2015-01-07 DIAGNOSIS — E119 Type 2 diabetes mellitus without complications: Secondary | ICD-10-CM | POA: Diagnosis not present

## 2015-01-08 ENCOUNTER — Other Ambulatory Visit: Payer: Self-pay | Admitting: Internal Medicine

## 2015-01-08 DIAGNOSIS — R0602 Shortness of breath: Secondary | ICD-10-CM

## 2015-01-08 DIAGNOSIS — R931 Abnormal findings on diagnostic imaging of heart and coronary circulation: Secondary | ICD-10-CM

## 2015-01-14 ENCOUNTER — Encounter: Payer: Self-pay | Admitting: Cardiovascular Disease

## 2015-01-14 NOTE — Progress Notes (Addendum)
Patient ID: [name], female   DOB: 02-14-1950, 65 y.o.   MRN: [id number]     Cardiology Office Note   Date:  01/15/2015   ID:  [name], DOB Aug 18, 1950, MRN [id number]  PCP:  Julia Mire, MD  Cardiologist:   Charlton Haws, MD   Chief Complaint  Patient presents with  . other    HX of SOB, abn Echo  . Establish Care      History of Present Illness: [name] is a 65 y.o. female who presents for evaluation of dyspnea Reviewed echo from 01/07/15 and evidence of elevated LVEDP and decreased EF She had a saddle PE in May 2016 Rx with lytics Post IR Intervention showed PA pressure 56/24 mmHg  Has been on Eliquis for anticoagulation  F/U CXR 12/26 NAD Initial echo 05/30/14 during PE estimated PA pressure peak systolic over 70 mmHg EF was low at that time As well 35-40%   Study Conclusions  - Left ventricle: The cavity size was normal. Wall thickness was increased in a pattern of mild LVH. Systolic function was moderately reduced. The estimated ejection fraction was in the range of 35% to 40%. Diffuse hypokinesis. Features are consistent with a pseudonormal left ventricular filling pattern, with concomitant abnormal relaxation and increased filling pressure (grade 2 diastolic dysfunction). - Aortic valve: Possibly bicuspid; moderately thickened, moderately calcified leaflets. - Mitral valve: There was mild regurgitation. - Left atrium: The atrium was moderately dilated. - Right ventricle: The cavity size was moderately dilated. Wall thickness was normal. Systolic function was mildly reduced. - Right atrium: The atrium was moderately dilated.  Impressions:  - Right ventricle function is improved when compared to prior echocardiogram.  She has lost 12 lbs with diuretic Feels better Discussed diagnosis of CHF.  No chest pain or signs of ischemic DCM  Past Medical History  Diagnosis Date  . Osteoarthritis   . Hypertension   . Morbid  obesity (HCC)   . Diabetes mellitus without complication [facility])     Past Surgical History  Procedure Laterality Date  . Cesarean section      x 3     Current Outpatient Prescriptions  Medication Sig Dispense Refill  . albuterol (PROVENTIL HFA;VENTOLIN HFA) 108 (90 BASE) MCG/ACT inhaler Inhale 1-2 puffs into the lungs every 6 (six) hours as needed for wheezing. 1 Inhaler 0  . famotidine (PEPCID) 20 MG tablet Take 20 mg by mouth at bedtime.    Marland Kitchen glimepiride (AMARYL) 4 MG tablet Take 4 mg by mouth daily with breakfast.    . irbesartan (AVAPRO) 300 MG tablet Take 300 mg by mouth daily.    . metFORMIN (GLUCOPHAGE) 500 MG tablet Take 1 tablet (500 mg total) by mouth 2 (two) times daily with a meal. 60 tablet 0  . pantoprazole (PROTONIX) 40 MG tablet Take 1 tablet (40 mg total) by mouth daily. Take 30-60 min before first meal of the day 30 tablet 2  . triamcinolone (KENALOG) 0.025 % ointment Apply 1 application topically 2 (two) times daily.    Marland Kitchen triamterene-hydrochlorothiazide (DYAZIDE) 50-25 MG capsule Take 1 capsule by mouth daily.    Carlena Hurl 15 MG TABS tablet Take 1 tablet by mouth daily.  5   No current facility-administered medications for this visit.    Allergies:   Review of patient's allergies indicates no known allergies.    Social History:  The patient  reports that she has never smoked. She has never used smokeless tobacco. She reports  that she does not drink alcohol or use illicit drugs.   Family History:  The patient's family history includes Allergies in her daughter and sister; Asthma in her daughter; Hypertension in her mother.    ROS:  Please see the history of present illness.   Otherwise, review of systems are positive for none.   All other systems are reviewed and negative.    PHYSICAL EXAM: VS:  Ht 5\' 7"  (1.702 m)  Wt 136.134 kg (300 lb 1.9 oz)  BMI 46.99 kg/m2 , BMI Body mass index is 46.99 kg/(m^2). Affect appropriate Healthy:  appears stated age HEENT:  normal Neck supple with no adenopathy JVP normal no bruits no thyromegaly Lungs clear with no wheezing and good diaphragmatic motion Heart:  S1/S2 no murmur, no rub, gallop or click PMI normal Abdomen: benighn, BS positve, no tenderness, no AAA no bruit.  No HSM or HJR Distal pulses intact with no bruits No edema Neuro non-focal Skin warm and dry No muscular weakness    EKG:    05/31/14 SR rate 100 low voltage poor R wave progression PVC no acute ST chagnes  01/15/15 SR rate 97  PVC;s LAFB no acute changes    Recent Labs: 05/27/2014: B Natriuretic Peptide 969.9* 05/28/2014: Magnesium 2.0 07/21/2014: ALT 20 12/31/2014: BUN 24*; Creatinine, Ser 0.96; Hemoglobin 10.6*; Platelets 326.0; Potassium 3.7; Pro B Natriuretic peptide (BNP) 667.0*; Sodium 142; TSH 0.76    Lipid Panel    Component Value Date/Time   CHOL 183 07/06/2012 0535   TRIG 249* 07/06/2012 0535   HDL 57 07/06/2012 0535   CHOLHDL 3.2 07/06/2012 0535   VLDL 50* 07/06/2012 0535   LDLCALC 76 07/06/2012 0535      Wt Readings from Last 3 Encounters:  01/15/15 136.134 kg (300 lb 1.9 oz)  12/31/14 141.613 kg (312 lb 3.2 oz)  12/23/14 140.161 kg (309 lb)      Other studies Reviewed: Additional studies/ records that were reviewed today include: Pulmonary notes EF notes and Epic records including xrays, ECG, labs .    ASSESSMENT AND PLAN:  1.  CHF:  Change diuretic to lasix with K  Check BMET/BNP.  Add low dose coreg.  F/U CHF clinic Would not rush to do right and left cath given body habitus 2. Obesity:  Discussed referral to bariatric center.  Also discussed strict salt free diet She seemed to have a hard time with this 3. PE:  Seemed to have some good result with lowering of PA pressure post lytics.  Continue anticoagulation likely life long 4. At some point should be referred for sleep study 5. PVC;s asymptomatic reflective of DCM adding beta blocker  Consider f/u holter to quantify once medical Rx  optimized   Current medicines are reviewed at length with the patient today.  The patient does not have concerns regarding medicines.  The following changes have been made:  Lasix, coreg added  D/C triamterene/HCTZ  Labs/ tests ordered today include: BMET/BNP  No orders of the defined types were placed in this encounter.     Disposition:   FU with CHF clinic and with me 6-8 weeks after that      Signed, Charlton HawsPeter Skarlett Sedlacek, MD  01/15/2015 11:38 AM    [facility] [address], [place], [place]  [address] Phone: [phone]; Fax: [phone]

## 2015-01-15 ENCOUNTER — Telehealth (HOSPITAL_COMMUNITY): Payer: Self-pay | Admitting: Vascular Surgery

## 2015-01-15 ENCOUNTER — Telehealth: Payer: Self-pay

## 2015-01-15 ENCOUNTER — Encounter: Payer: Self-pay | Admitting: Cardiovascular Disease

## 2015-01-15 ENCOUNTER — Ambulatory Visit (INDEPENDENT_AMBULATORY_CARE_PROVIDER_SITE_OTHER): Payer: Medicare Other | Admitting: Cardiovascular Disease

## 2015-01-15 VITALS — BP 162/94 | HR 46 | Ht 67.0 in | Wt 300.1 lb

## 2015-01-15 DIAGNOSIS — Z7689 Persons encountering health services in other specified circumstances: Secondary | ICD-10-CM

## 2015-01-15 DIAGNOSIS — R0602 Shortness of breath: Secondary | ICD-10-CM | POA: Diagnosis not present

## 2015-01-15 DIAGNOSIS — Z7189 Other specified counseling: Secondary | ICD-10-CM | POA: Diagnosis not present

## 2015-01-15 LAB — BASIC METABOLIC PANEL
BUN: 27 mg/dL — AB (ref 7–25)
CO2: 28 mmol/L (ref 20–31)
CREATININE: 0.93 mg/dL (ref 0.50–0.99)
Calcium: 10 mg/dL (ref 8.6–10.4)
Chloride: 104 mmol/L (ref 98–110)
Glucose, Bld: 68 mg/dL (ref 65–99)
POTASSIUM: 4 mmol/L (ref 3.5–5.3)
Sodium: 139 mmol/L (ref 135–146)

## 2015-01-15 MED ORDER — FUROSEMIDE 40 MG PO TABS: 40.0000 mg | ORAL_TABLET | Freq: Every day | ORAL | Status: AC

## 2015-01-15 MED ORDER — CARVEDILOL 3.125 MG PO TABS: 3.1250 mg | ORAL_TABLET | Freq: Two times a day (BID) | ORAL | Status: AC

## 2015-01-15 MED ORDER — POTASSIUM CHLORIDE ER 10 MEQ PO TBCR: 10.0000 meq | EXTENDED_RELEASE_TABLET | Freq: Every day | ORAL | Status: AC

## 2015-01-15 NOTE — Telephone Encounter (Signed)
Left message for patient to call back. Per Dr. Eden EmmsNishan, patient is to start Coreg 3.125 mg by mouth twice a day as directed in her discharge summary at her office appointment today. Patient had questions about this medication and we will answer them when she returns our call.

## 2015-01-15 NOTE — Telephone Encounter (Signed)
Called patient back to make sure she understood her medication changes. Patient verbalized understanding and will give our office a call with any questions or concerns.

## 2015-01-15 NOTE — Patient Instructions (Addendum)
Medication Instructions:  Your physician has recommended you make the following change in your medication:  1-Lasix 40 mg by mouth daily 2-Potassium 10 meq by mouth daily 3-Coreg 3.125 mg by mouth twice daily 4 STOP Dyazide  Labwork: Your physician recommends that you have lab work today. BMET and BNP.  Testing/Procedures: NONE  Follow-Up: Your physician recommends that you schedule a follow-up appointment as new patient in Heart Failure Clinic Your physician recommends that you schedule a follow-up appointment next available with Dr. Eden EmmsNishan at least 6 weeks between Heart Failure Clinic.     Any Other Special Instructions Will Be Listed Below (If Applicable).     If you need a refill on your cardiac medications before your next appointment, please call your pharmacy.

## 2015-01-15 NOTE — Telephone Encounter (Signed)
Left message to make pt a new pt appt w/ hf clinic

## 2015-01-16 LAB — BRAIN NATRIURETIC PEPTIDE: Brain Natriuretic Peptide: 89.8 pg/mL (ref 0.0–100.0)

## 2015-02-24 NOTE — Progress Notes (Signed)
Patient ID: [name], female   DOB: [birth year], 65 y.o.   MRN: [id number]       Cardiology Office Note   Date:  02/24/2015   ID:  [name], DOB 1950/10/10, MRN [id number]  PCP:  Julia Mire, MD  Cardiologist:   Julia Haws, MD   No chief complaint on file.     History of Present Illness: [name] is a 65 y.o. female who presents for evaluation of dyspnea Reviewed echo from 01/07/15 and evidence of elevated LVEDP and decreased EF She had a saddle PE in May 2016 Rx with lytics Post IR  Intervention showed PA pressure 56/24 mmHg  Has been on Eliquis for anticoagulation  F/U CXR 12/26 NAD Initial echo 05/30/14 during PE estimated PA pressure peak systolic over 70 mmHg EF was low at that time As well 35-40%   Study Conclusions  - Left ventricle: The cavity size was normal. Wall thickness was increased in a pattern of mild LVH. Systolic function was moderately reduced. The estimated ejection fraction was in the range of 35% to 40%. Diffuse hypokinesis. Features are consistent with a pseudonormal left ventricular filling pattern, with concomitant abnormal relaxation and increased filling pressure (grade 2 diastolic dysfunction). - Aortic valve: Possibly bicuspid; moderately thickened, moderately calcified leaflets. - Mitral valve: There was mild regurgitation. - Left atrium: The atrium was moderately dilated. - Right ventricle: The cavity size was moderately dilated. Wall thickness was normal. Systolic function was mildly reduced. - Right atrium: The atrium was moderately dilated.  Impressions:  - Right ventricle function is improved when compared to prior echocardiogram.  Last visit diuretic changed to lasix and coreg started   Past Medical History  Diagnosis Date  . Osteoarthritis   . Hypertension   . Morbid obesity (HCC)   . Diabetes mellitus without complication [facility])     Past Surgical History  Procedure Laterality Date   . Cesarean section      x 3     Current Outpatient Prescriptions  Medication Sig Dispense Refill  . albuterol (PROVENTIL HFA;VENTOLIN HFA) 108 (90 BASE) MCG/ACT inhaler Inhale 1-2 puffs into the lungs every 6 (six) hours as needed for wheezing. 1 Inhaler 0  . carvedilol (COREG) 3.125 MG tablet Take 1 tablet (3.125 mg total) by mouth 2 (two) times daily with a meal. 180 tablet 3  . famotidine (PEPCID) 20 MG tablet Take 20 mg by mouth at bedtime.    . furosemide (LASIX) 40 MG tablet Take 1 tablet (40 mg total) by mouth daily. 90 tablet 3  . glimepiride (AMARYL) 4 MG tablet Take 4 mg by mouth daily with breakfast.    . irbesartan (AVAPRO) 300 MG tablet Take 300 mg by mouth daily.    . metFORMIN (GLUCOPHAGE) 500 MG tablet Take 1 tablet (500 mg total) by mouth 2 (two) times daily with a meal. 60 tablet 0  . pantoprazole (PROTONIX) 40 MG tablet Take 1 tablet (40 mg total) by mouth daily. Take 30-60 min before first meal of the day 30 tablet 2  . potassium chloride (K-DUR) 10 MEQ tablet Take 1 tablet (10 mEq total) by mouth daily. 90 tablet 3  . triamcinolone (KENALOG) 0.025 % ointment Apply 1 application topically 2 (two) times daily.    Carlena Hurl 15 MG TABS tablet Take 1 tablet by mouth daily.  5   No current facility-administered medications for this visit.    Allergies:   Review of patient's allergies indicates no known allergies.  Social History:  The patient  reports that she has never smoked. She has never used smokeless tobacco. She reports that she does not drink alcohol or use illicit drugs.   Family History:  The patient's family history includes Allergies in her daughter and sister; Asthma in her daughter; Hypertension in her mother.    ROS:  Please see the history of present illness.   Otherwise, review of systems are positive for none.   All other systems are reviewed and negative.    PHYSICAL EXAM: VS:  There were no vitals taken for this visit. , BMI There is no weight  on file to calculate BMI. Affect appropriate Healthy:  appears stated age HEENT: normal Neck supple with no adenopathy JVP normal no bruits no thyromegaly Lungs clear with no wheezing and good diaphragmatic motion Heart:  S1/S2 no murmur, no rub, gallop or click PMI normal Abdomen: benighn, BS positve, no tenderness, no AAA no bruit.  No HSM or HJR Distal pulses intact with no bruits No edema Neuro non-focal Skin warm and dry No muscular weakness    EKG:    05/31/14 SR rate 100 low voltage poor R wave progression PVC no acute ST chagnes  01/15/15 SR rate 97  PVC;s LAFB no acute changes    Recent Labs: 05/27/2014: B Natriuretic Peptide 969.9* 05/28/2014: Magnesium 2.0 07/21/2014: ALT 20 12/31/2014: Hemoglobin 10.6*; Platelets 326.0; Pro B Natriuretic peptide (BNP) 667.0*; TSH 0.76 01/15/2015: BUN 27*; Creat 0.93; Potassium 4.0; Sodium 139    Lipid Panel    Component Value Date/Time   CHOL 183 07/06/2012 0535   TRIG 249* 07/06/2012 0535   HDL 57 07/06/2012 0535   CHOLHDL 3.2 07/06/2012 0535   VLDL 50* 07/06/2012 0535   LDLCALC 76 07/06/2012 0535      Wt Readings from Last 3 Encounters:  01/15/15 136.134 kg (300 lb 1.9 oz)  12/31/14 141.613 kg (312 lb 3.2 oz)  12/23/14 140.161 kg (309 lb)      Other studies Reviewed: Additional studies/ records that were reviewed today include: Pulmonary notes EF notes and Epic records including xrays, ECG, labs .    ASSESSMENT AND PLAN:  1.  CHF:   2. Obesity:  Discussed referral to bariatric center.  Also discussed strict salt free diet She seemed to have a hard time with this 3. PE:  Seemed to have some good result with lowering of PA pressure post lytics.  Continue anticoagulation likely life long 4.  OSA   At some point should be referred for sleep study 5. PVC;s asymptomatic reflective of DCM adding beta blocker  Consider f/u holter to quantify once medical Rx optimized   Current medicines are reviewed at length with the  patient today.  The patient does not have concerns regarding medicines.  The following changes have been made:  Lasix, coreg added  D/C triamterene/HCTZ  Labs/ tests ordered today include: BMET/BNP  No orders of the defined types were placed in this encounter.     Disposition:   FU with CHF clinic and with me 6-8 weeks after that      Signed, Julia Haws, MD  02/24/2015 10:36 AM    [facility] [address] [place], [place], [place]  [address] Phone: [phone]; Fax: [phone]

## 2015-03-02 ENCOUNTER — Encounter: Payer: Medicare Other | Admitting: Cardiovascular Disease

## 2015-04-01 ENCOUNTER — Other Ambulatory Visit: Payer: Self-pay

## 2015-04-01 DIAGNOSIS — Z1231 Encounter for screening mammogram for malignant neoplasm of breast: Secondary | ICD-10-CM

## 2015-06-03 ENCOUNTER — Ambulatory Visit: Payer: Medicare Other

## 2015-06-10 ENCOUNTER — Ambulatory Visit: Payer: Medicare Other

## 2015-10-07 ENCOUNTER — Ambulatory Visit: Payer: Medicare Other

## 2015-10-16 ENCOUNTER — Ambulatory Visit: Payer: Medicare Other

## 2016-06-06 ENCOUNTER — Other Ambulatory Visit: Payer: Self-pay | Admitting: Internal Medicine

## 2016-06-06 DIAGNOSIS — Z1231 Encounter for screening mammogram for malignant neoplasm of breast: Secondary | ICD-10-CM

## 2016-06-28 ENCOUNTER — Ambulatory Visit: Payer: Medicare Other

## 2016-07-07 ENCOUNTER — Inpatient Hospital Stay: Admission: RE | Admit: 2016-07-07 | Payer: Medicare Other | Source: Ambulatory Visit

## 2016-07-28 IMAGING — XA IR THROMB F/U EVAL ART/VEN FINAL DAY
1 series · 2 of 2 positions shown · non-contrast
Comparison: none

CLINICAL DATA: 64-year-old female with sub massive a bordering on
massive PE. Bilateral pulmonary artery ultrasound accelerated
thrombolysis was initiated on [DATE] 3619. Due to a suboptimal
clinical result after initial 12 hours and 24 mg tPA, lysis was
repeated for an additional 12 hours and 24 mg tPA. Patient now
presents after significant clinical improvement for repeat pressure
measurements and catheter removal.
TECHNIQUE: Informed consent was obtained from the patient following explanation
of the procedure, risks, benefits and alternatives. The patient
understands, agrees and consents for the procedure. All questions
were addressed. A time out was performed.

[Series 1: run · 2 of 2 slices shown]
[im 1/2]
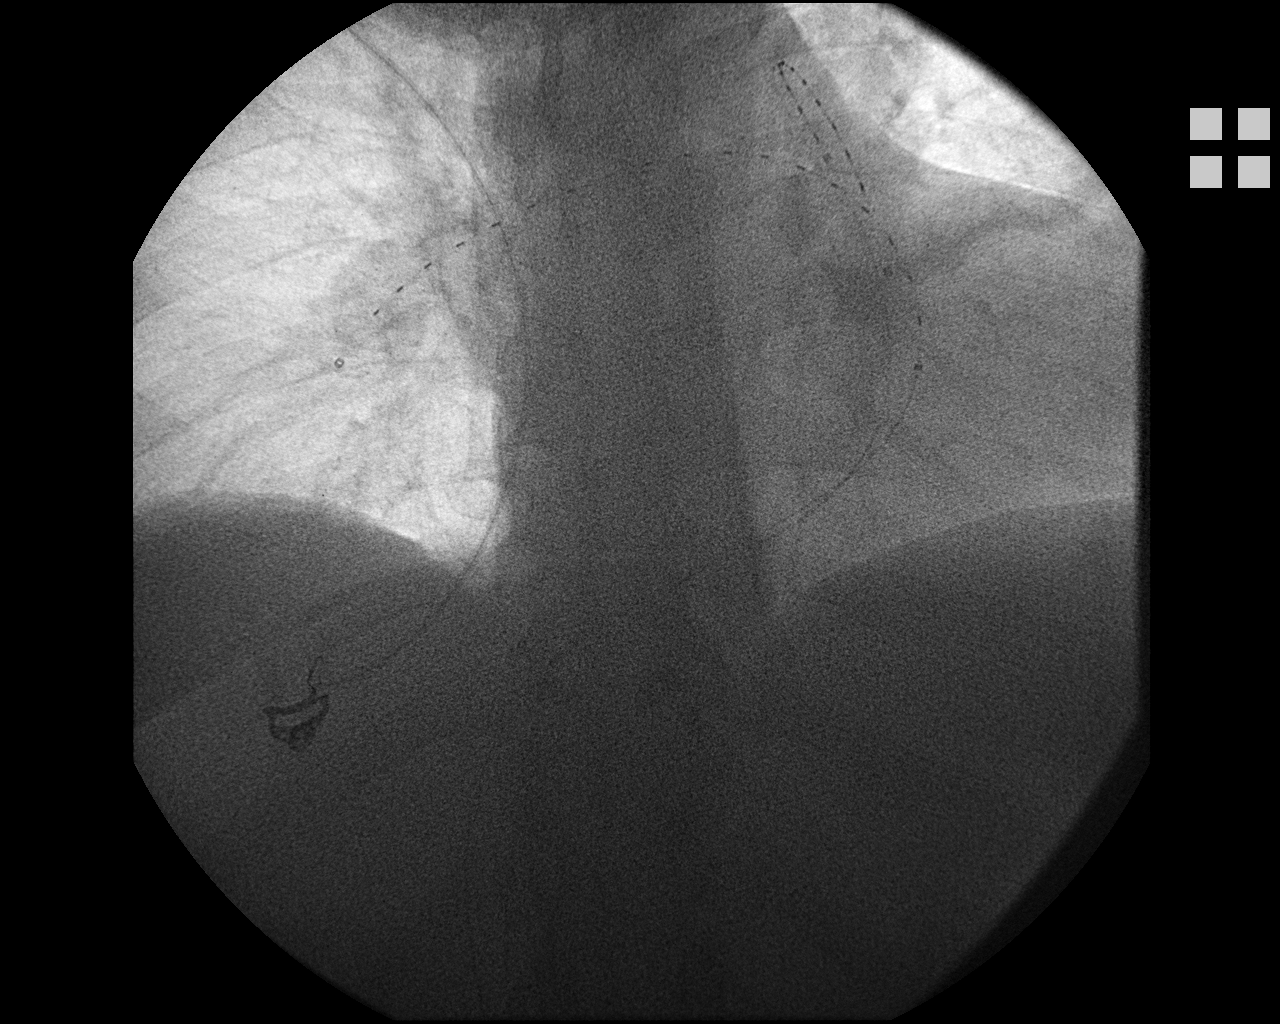
[im 2/2]
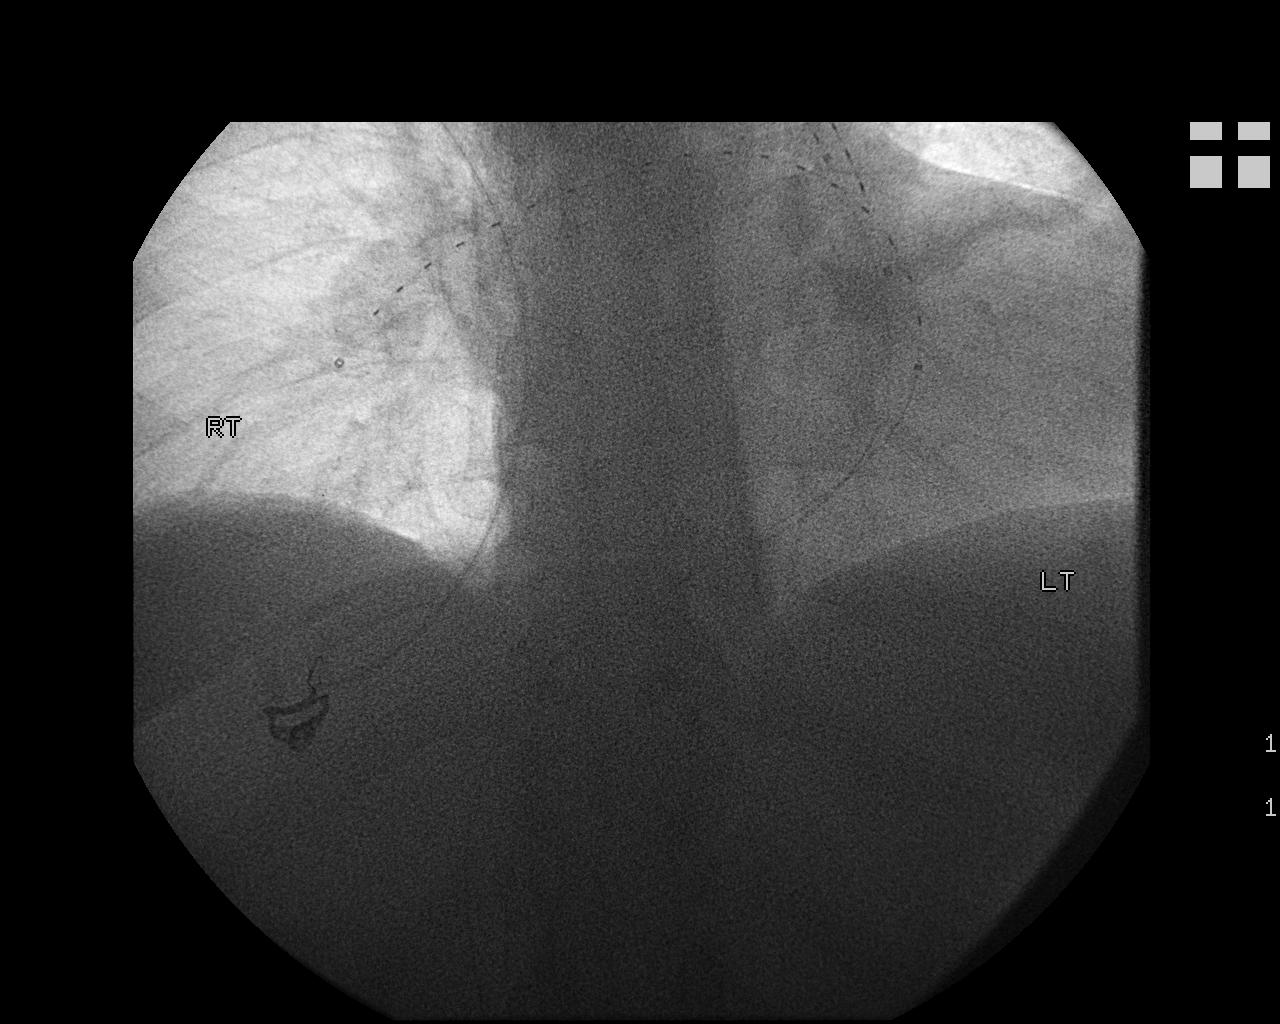

[2 of 2 positions shown; findings below may reference images not displayed]

EXAM:
IR THROMB F/U EVAL MIRLANDE/TIGER FINAL DAY

Date: 05/29/2014

PROCEDURE:
1. Catheter repositioning under fluoroscopy
2. Pulmonary arterial pressure measurement
3. Removal of slices catheters and vascular sheaths

ANESTHESIA/SEDATION:
None required

MEDICATIONS:
None required

FLUOROSCOPY TIME:  54 seconds

33.8 mGy

CONTRAST:  None
Under fluoroscopic guidance, the right pulmonary artery lysis
catheter was brought back so that the end hole was within the main
pulmonary artery. The pressure transducer was then connected to the
catheter which was flushed, aspirated and 0 to. Main pulmonary
arterial pressure stents were then obtained. The main pulmonary
arterial pressure is 56/24 for a mean of 34 mm Hg.

The catheter was removed. Similarly, the left pulmonary artery
catheter was brought back into the main pulmonary artery in the
pressure repeated with similar measurements. This catheter was
removed. The vascular sheaths were then pulled and hemostasis
attained by manual pressure.

COMPLICATIONS:
None
IMPRESSION: 1. Significant improvement in main pulmonary arterial pressure to
56/24 (mean 34) mmHg following a total of 24 hours of a bilateral
pulmonary arterial lie cyst using a total of 48 mg tPA. This
occurred over a 36 hour period.

## 2016-09-20 ENCOUNTER — Other Ambulatory Visit (HOSPITAL_COMMUNITY): Payer: Self-pay | Admitting: General Surgery

## 2016-09-29 ENCOUNTER — Encounter: Payer: Self-pay | Admitting: Registered"

## 2016-09-29 ENCOUNTER — Encounter: Payer: Medicare Other | Attending: General Surgery | Admitting: Registered"

## 2016-09-29 DIAGNOSIS — Z7984 Long term (current) use of oral hypoglycemic drugs: Secondary | ICD-10-CM | POA: Insufficient documentation

## 2016-09-29 DIAGNOSIS — Z79899 Other long term (current) drug therapy: Secondary | ICD-10-CM | POA: Diagnosis not present

## 2016-09-29 DIAGNOSIS — Z86711 Personal history of pulmonary embolism: Secondary | ICD-10-CM | POA: Diagnosis not present

## 2016-09-29 DIAGNOSIS — E119 Type 2 diabetes mellitus without complications: Secondary | ICD-10-CM | POA: Insufficient documentation

## 2016-09-29 DIAGNOSIS — Z7901 Long term (current) use of anticoagulants: Secondary | ICD-10-CM | POA: Insufficient documentation

## 2016-09-29 DIAGNOSIS — Z713 Dietary counseling and surveillance: Secondary | ICD-10-CM | POA: Diagnosis not present

## 2016-09-29 DIAGNOSIS — Z7982 Long term (current) use of aspirin: Secondary | ICD-10-CM | POA: Diagnosis not present

## 2016-09-29 DIAGNOSIS — I10 Essential (primary) hypertension: Secondary | ICD-10-CM | POA: Insufficient documentation

## 2016-09-29 DIAGNOSIS — Z6841 Body Mass Index (BMI) 40.0 and over, adult: Secondary | ICD-10-CM | POA: Insufficient documentation

## 2016-09-29 DIAGNOSIS — Z7951 Long term (current) use of inhaled steroids: Secondary | ICD-10-CM | POA: Insufficient documentation

## 2016-09-29 DIAGNOSIS — Z86718 Personal history of other venous thrombosis and embolism: Secondary | ICD-10-CM | POA: Diagnosis not present

## 2016-09-29 NOTE — Progress Notes (Signed)
Pre-Op Assessment Visit:  Pre-Operative Sleeve gastrectomy Surgery  Medical Nutrition Therapy:  Appt start time: 9:30  End time:  10:30  Patient was seen on 09/29/2016 for Pre-Operative Nutrition Assessment. Assessment and letter of approval faxed to [facility] Bariatric Surgery Program coordinator on 09/29/2016.   Pt expectation of surgery: wants to stay alive for children, relieve arthritis in knees  Pt expectation of Dietitian: none stated  Start weight at [facility]: 298.6 BMI: 49.69   Pt is talkative. Pt states she believes that surgery is the right thing to do. Pt states she started going to [employer] 7 weeks ago. Pt states she checks BS 2x/day: FBS (99-106) and evenings (116-130). Pt states last A1c was good; in prediabetic range; unsure of value and date taken. Next visit cover Protein Shakes and Vitamin and Mineral Recommendations.   Per insurance, pt needs 6 SWL visits prior to surgery.    24 hr Dietary Recall: First Meal: skips; cereal or turkey/cheese sandwich Snack: sometimes fruit or yogurt Second Meal: peanut butter crackers (6 pack) Snack: none Third Meal: baked chicken wings, salad Snack: none Beverages: water, lactose-free 2% milk with cereal, diet soda (occasionally)  Encouraged to engage in 75 minutes of moderate physical activity including cardiovascular and weight baring weekly  Handouts given during visit include:  . Pre-Op Goals  During the appointment today the following Pre-Op Goals were reviewed with the patient: . Maintain or lose weight as instructed by your surgeon . Make healthy food choices . Begin to limit portion sizes . Limited concentrated sugars and fried foods . Keep fat/sugar in the single digits per serving on          food labels . Practice CHEWING your food  (aim for 30 chews per bite or until applesauce consistency) . Practice not drinking 15 minutes before, during, and 30 minutes after each meal/snack . Avoid all  carbonated beverages  . Avoid/limit caffeinated beverages  . Avoid all sugar-sweetened beverages . Consume 3 meals per day; eat every 3-5 hours . Make a list of non-food related activities . Aim for 64-100 ounces of FLUID daily  . Aim for at least 60-80 grams of PROTEIN daily . Look for a liquid protein source that contain ?15 g protein and ?5 g carbohydrate  (ex: shakes, drinks, shots) . Physical activity is an important part of a healthy lifestyle so keep it moving!  Follow diet recommendations listed below Energy and Macronutrient Recommendations: Calories: 1800 Carbohydrate: 300 Protein: 135 Fat: 50  Demonstrated degree of understanding via:  Teach Back   Teaching Method Utilized:  Visual Auditory Hands on  Barriers to learning/adherence to lifestyle change: none  Patient to call the Nutrition and Diabetes Education Services to enroll in Pre-Op and Post-Op Nutrition Education when surgery date is scheduled.

## 2016-09-30 ENCOUNTER — Ambulatory Visit (HOSPITAL_COMMUNITY): Payer: Medicare Other

## 2016-10-04 ENCOUNTER — Ambulatory Visit (HOSPITAL_COMMUNITY)
Admission: RE | Admit: 2016-10-04 | Discharge: 2016-10-04 | Disposition: A | Discharge status: Home / Self Care | Payer: Medicare Other | Source: Ambulatory Visit | Attending: General Surgery | Admitting: General Surgery

## 2016-10-04 DIAGNOSIS — Z01818 Encounter for other preprocedural examination: Secondary | ICD-10-CM | POA: Diagnosis present

## 2016-10-04 DIAGNOSIS — Z01810 Encounter for preprocedural cardiovascular examination: Secondary | ICD-10-CM | POA: Diagnosis present

## 2016-10-27 ENCOUNTER — Ambulatory Visit: Payer: Self-pay | Admitting: Registered"

## 2016-11-01 ENCOUNTER — Encounter: Payer: Medicare Other | Attending: General Surgery | Admitting: Registered"

## 2016-11-01 ENCOUNTER — Encounter: Payer: Self-pay | Admitting: Registered"

## 2016-11-01 DIAGNOSIS — E119 Type 2 diabetes mellitus without complications: Secondary | ICD-10-CM

## 2016-11-01 DIAGNOSIS — Z713 Dietary counseling and surveillance: Secondary | ICD-10-CM | POA: Diagnosis present

## 2016-11-01 DIAGNOSIS — Z6841 Body Mass Index (BMI) 40.0 and over, adult: Secondary | ICD-10-CM | POA: Insufficient documentation

## 2016-11-01 NOTE — Progress Notes (Signed)
Appt start time: 12:08 end time: 12:30  Assessment: 1st SWL Appointment.   Start Wt at [facility]: 298.6 Wt: 297.9 BMI: 49.57   Pt arrives 45 min late due to [facility] transportation being behind schedule. Pt arrives having maintained weight from previous visit. Pt states she has a lot going on right now with her daughter being in the hospital, son being away in college at [employer], and she recently fell while walking in her apartment complex. Pt states she does not drive.   Pt was provided education on Protein Shake and Vitamin and Mineral Recommendations.   MEDICATIONS: See list   DIETARY INTAKE:  24-hr recall:  B ( AM): skips; cereal or turkey/cheese sandwich  Snk ( AM): sometimes fruit or yogurt  L ( PM):  peanut butter crackers (6 pack) Snk ( PM): none D ( PM): baked chicken wings, salad Snk ( PM): none Beverages: water, lactose-free 2% milk with cereal, diet soda (occasionally)  Usual physical activity: some arm and leg exercises  Diet to Follow: 1800 calories 200 g carbohydrates 135 g protein 50 g fat  Preferred Learning Style:   No preference indicated   Learning Readiness:   Not ready  Contemplating     Nutritional Diagnosis:  [place]-3.3 Overweight/obesity related to past poor dietary habits and physical inactivity as evidenced by patient w/ planned sleeve gastrectomy surgery following dietary guidelines for continued weight loss.    Intervention:  Nutrition counseling for upcoming Bariatric Surgery.  Goals:  - Aim for 150 minutes of physical activity including cardio and weight bearing every week- Consume 3 meals per day; eat every 3-5 hours - Physical activity is an important part of a healthy lifestyle so keep it moving!  Teaching Method Utilized:  Visual Auditory Hands on  Handouts given during visit include:  Protein Shakes  Vitamin and Mineral Recommendations  Barriers to learning/adherence to lifestyle change: contemplative stage of  change  Demonstrated degree of understanding via:  Teach Back   Monitoring/Evaluation:  Dietary intake, exercise, and body weight in 1 month(s).

## 2016-11-01 NOTE — Patient Instructions (Addendum)
-   Consume 3 meals per day; eat every 3-5 hours  - Physical activity is an important part of a healthy lifestyle so keep it moving!

## 2016-11-08 ENCOUNTER — Emergency Department (HOSPITAL_COMMUNITY)
Admission: EM | Admit: 2016-11-08 | Discharge: 2016-12-03 | Disposition: E | Payer: Medicare Other | Attending: Emergency Medicine | Admitting: Emergency Medicine

## 2016-11-08 DIAGNOSIS — N182 Chronic kidney disease, stage 2 (mild): Secondary | ICD-10-CM | POA: Diagnosis not present

## 2016-11-08 DIAGNOSIS — E1122 Type 2 diabetes mellitus with diabetic chronic kidney disease: Secondary | ICD-10-CM | POA: Diagnosis not present

## 2016-11-08 DIAGNOSIS — Z86711 Personal history of pulmonary embolism: Secondary | ICD-10-CM | POA: Insufficient documentation

## 2016-11-08 DIAGNOSIS — I469 Cardiac arrest, cause unspecified: Secondary | ICD-10-CM | POA: Diagnosis present

## 2016-11-08 DIAGNOSIS — Z7984 Long term (current) use of oral hypoglycemic drugs: Secondary | ICD-10-CM | POA: Diagnosis not present

## 2016-11-08 DIAGNOSIS — Z79899 Other long term (current) drug therapy: Secondary | ICD-10-CM | POA: Diagnosis not present

## 2016-11-08 DIAGNOSIS — J45909 Unspecified asthma, uncomplicated: Secondary | ICD-10-CM | POA: Insufficient documentation

## 2016-11-08 DIAGNOSIS — I129 Hypertensive chronic kidney disease with stage 1 through stage 4 chronic kidney disease, or unspecified chronic kidney disease: Secondary | ICD-10-CM | POA: Diagnosis not present

## 2016-11-08 MED ORDER — EPINEPHRINE PF 1 MG/10ML IJ SOSY
PREFILLED_SYRINGE | INTRAMUSCULAR | Status: AC | PRN
  Administered 2016-11-08 (×2): 1 mg via INTRAVENOUS

## 2016-11-08 MED ORDER — DEXTROSE 5 % IV SOLN
INTRAVENOUS | Status: AC | PRN
  Administered 2016-11-08: 150 mg via INTRAVENOUS

## 2016-11-08 MED FILL — Medication: Qty: 1 | Status: AC

## 2016-11-17 ENCOUNTER — Ambulatory Visit: Payer: Medicare Other | Admitting: Psychiatry

## 2016-12-01 ENCOUNTER — Ambulatory Visit: Payer: Self-pay | Admitting: Registered"

## 2016-12-03 NOTE — ED Provider Notes (Signed)
[facility] EMERGENCY DEPARTMENT Provider Note   CSN: [id number] Arrival date & time: 15-Nov-2016  1102     History   Chief Complaint Chief Complaint  Patient presents with  . Cardiac Arrest    HPI [name] is a 66 y.o. female.  HPI Patient was brought by EMS with CPR in progress.  Patient had called for shortness of breath.  Upon arrival EMS reports patient was extremely short of breath pale and diaphoretic.  They were just transitioning her to their stretcher when she became unresponsive.  They had 3 rounds of CPR on route with return of pulses on the first 2 events.  Third event they had approximately 7 minutes of pulselessness before arrival to the emergency department.  Patient's history is for pulmonary embolus.  Unable to obtain EKG prior to patient arrest.  Later history from the patient's daughter indicates possible depression and medication noncompliance since her husband's death about a year ago.  Patient daughter reports that she had not been with her recently until just today.  She reports at the time of EMS arrival patient had admitted she been short of breath since last night. Past Medical History:  Diagnosis Date  . Diabetes mellitus without complication (HCC)   . Hypertension   . Morbid obesity (HCC)   . Osteoarthritis     Patient Active Problem List   Diagnosis Date Noted  . AKI (acute kidney injury) (HCC)   . PE (pulmonary embolism) 05/27/2014  . Upper airway cough syndrome 05/20/2013  . SOB (shortness of breath) 07/09/2012  . Chronic systolic CHF (congestive heart failure) (HCC) 07/06/2012  . Diabetes mellitus, new onset (HCC) 07/05/2012  . Asthma, chronic 07/05/2012  . Severe obesity (BMI >= 40) (HCC) 07/05/2012  . CKD (chronic kidney disease) stage 2, GFR 60-89 ml/min 07/05/2012  . Essential hypertension   . HHNC (hyperglycemic hyperosmolar nonketotic coma) (HCC) 07/04/2012  . Acute renal failure (HCC) 07/04/2012    Past  Surgical History:  Procedure Laterality Date  . CESAREAN SECTION     x 3    OB History    No data available       Home Medications    Prior to Admission medications   Medication Sig Start Date End Date Taking? Authorizing Provider  albuterol (PROVENTIL HFA;VENTOLIN HFA) 108 (90 BASE) MCG/ACT inhaler Inhale 1-2 puffs into the lungs every 6 (six) hours as needed for wheezing. 01/22/12   Nelva Nay, MD  Biotin 1 MG CAPS Take by mouth.    [provider]  carvedilol (COREG) 3.125 MG tablet Take 1 tablet (3.125 mg total) by mouth 2 (two) times daily with a meal. Patient not taking: Reported on 09/29/2016 01/15/15   Wendall Stade, MD  famotidine (PEPCID) 20 MG tablet Take 20 mg by mouth at bedtime.    [provider]  ferrous sulfate 325 (65 FE) MG tablet Take 325 mg by mouth daily with breakfast.    [provider]  furosemide (LASIX) 40 MG tablet Take 1 tablet (40 mg total) by mouth daily. Patient not taking: Reported on 09/29/2016 01/15/15   Wendall Stade, MD  glimepiride (AMARYL) 4 MG tablet Take 4 mg by mouth daily with breakfast.    [provider]  irbesartan (AVAPRO) 300 MG tablet Take 300 mg by mouth daily.    [provider]  magnesium 30 MG tablet Take 30 mg by mouth 2 (two) times daily.    [provider]  metFORMIN (  GLUCOPHAGE) 500 MG tablet Take 1 tablet (500 mg total) by mouth 2 (two) times daily with a meal. 07/11/12   Clydia LlanoElmahi, Mutaz, MD  Omega-3 Fatty Acids (FISH OIL) 1000 MG CAPS Take by mouth.    [provider]  pantoprazole (PROTONIX) 40 MG tablet Take 1 tablet (40 mg total) by mouth daily. Take 30-60 min before first meal of the day Patient not taking: Reported on 09/29/2016 12/31/14   Nyoka CowdenWert, Michael B, MD  potassium chloride (K-DUR) 10 MEQ tablet Take 1 tablet (10 mEq total) by mouth daily. Patient not taking: Reported on 09/29/2016 01/15/15   Wendall StadeNishan, Peter C, MD  triamcinolone (KENALOG) 0.025 % ointment  Apply 1 application topically 2 (two) times daily.    [provider]  triamterene-hydrochlorothiazide (DYAZIDE) 37.5-25 MG capsule Take 1 capsule by mouth daily.    [provider]  XARELTO 15 MG TABS tablet Take 1 tablet by mouth daily. 12/03/14   [provider]    Family History Family History  Problem Relation Age of Onset  . Hypertension Mother   . Allergies Daughter   . Allergies Sister   . Asthma Daughter     Social History Social History   Tobacco Use  . Smoking status: Never Smoker  . Smokeless tobacco: Never Used  Substance Use Topics  . Alcohol use: No  . Drug use: No     Allergies   Patient has no known allergies.   Review of Systems Review of Systems Level 5 caveat cannot obtain due to patient condition  Physical Exam Updated Vital Signs BP (!) 201/91 (BP Location: Left Arm)   Pulse (!) 138   Resp 16   Wt 135 kg (297 lb 9.9 oz)   SpO2 100%   BMI 49.53 kg/m   Physical Exam  Constitutional:  Patient arrives with active CPR.  Extremely pale in appearance morbid obesity.  HENT:  Head: Normocephalic and atraumatic.  No obvious facial trauma.  Patient is orally intubated without any evidence of blood or significant secretions in the airway.  Eyes:  Pupils are fixed midrange.  Neck: Neck supple.  Cardiovascular:  No murmur heard. Cannot auscultate heart sounds on arrival.  No palpable pulses without chest compressions.  Pulmonary/Chest:  Patient is intubated and actively being bagged.  With bagging breath sounds are audible bilaterally and symmetric  Abdominal: Soft.  Abdomen is soft and not overtly distended.  Musculoskeletal:  Patient's extremities are flaccid.  Neurological:  Patient is unresponsive to pain and no spontaneous respiratory effort  Skin: There is pallor.  Nursing note and vitals reviewed.    ED Treatments / Results  Labs (all labs ordered are listed, but only abnormal results are displayed) Labs  Reviewed - No data to display  EKG  EKG Interpretation None       Radiology No results found.  Procedures Procedures (including critical care time)  Medications Ordered in ED Medications  EPINEPHrine (ADRENALIN) 1 MG/10ML injection (1 mg Intravenous Given 11/28/2016 1112)  amiodarone (CORDARONE) 150 mg in dextrose 5 % 100 mL bolus ( Intravenous Stopped 12/01/2016 1228)     Initial Impression / Assessment and Plan / ED Course  I have reviewed the triage vital signs and the nursing notes.  Pertinent labs & imaging results that were available during my care of the patient were reviewed by me and considered in my medical decision making (see chart for details).     Final Clinical Impressions(s) / ED Diagnoses   Final diagnoses:  Cardiopulmonary arrest [facility](HCC)  Patient arrived pulseless with PEA.  Chest compressions were continued.  Epinephrinex3 and atropinex1 administered.  On 2 instances patient had fine ventricular fibrillation which converted back to PEA with shock.  After 3 epinephrine and chest compressions, ultrasound used for cardiac evaluation.  There was no cardiac activity in the ventricle was decompressed.  At that time code discontinued.  I did review the events with the patient's family members.  Answered all questions.  ED Discharge Orders    None       Arby BarrettePfeiffer, Vidal Lampkins, MD 29-Nov-2016 1623

## 2016-12-03 NOTE — Progress Notes (Signed)
Patient intubated with a #7.0 ETT on arrival. Patient bagged with 100% O2 until code was called.

## 2016-12-03 NOTE — Code Documentation (Signed)
Patient time of death occurred at [id number].

## 2016-12-03 NOTE — ED Notes (Addendum)
Called [facility] and updated them with family contact information. Talked with Selena BattenKim who was the back-up service.

## 2016-12-03 NOTE — ED Triage Notes (Signed)
Pt arrived by EMS with CPR in progress. Per EMS Pt coded at 1039, regained pulses at 1044, CPR initiated again at 1050, pulses regained at 1053 and upon arrival CPR was again in progress. Per EMS 1 epi was given, 500 normal saline and a 7.0 tube placed.

## 2016-12-03 NOTE — Code Documentation (Signed)
Family updated as to patient's status.

## 2016-12-03 NOTE — Progress Notes (Signed)
While rounding in ED CPR was being performed on pt who had coded in route to hospital. CPR continued and Pt. Deceased. Pt daughter Donzetta StarchFunmilya Acebo rode in ambulance and was later escorted to consultation room and to bedside. Patient daughter indicated that she was just released from mental health hospitIaL visit this morning and was not feel well. Daughter was given sandwich bag and soda due to her nearly pass out and aggressively asking for food. Spoke with both patient nurse and charge nurse regarding next of kind information and we agreed that due do daughter at bedside mental health history it would be better to wait for other family member to contact  Hospital.  Patient placement card was given to daughter with instructions on how to proceed. The is another sibling name Valetta CloseShade Pingleton [phone] [facility] secretary and spoke with her sister who told her over phone of her mother's death. Shade was weeping and shortly there after was no longer on phone. Shade called back and said she was in route to hospital and she would take her sister home.  Pt's nurse gave to [facility] patient belonging Cell phone, charger and earrings.  Provided emotional, grief and spiritual support. Facilitated information sharing between staff and family. Passed on to Chaplain colleague to continue support.   06-Mar-2016 1155  Clinical Encounter Type  Visited With Patient;Family;Patient and family together;Health care provider  Visit Type Initial;Spiritual support;Death;ED  Referral From Nurse  Spiritual Encounters  Spiritual Needs Emotional;Grief support  Stress Factors  Family Stress Factors Exhausted;Loss  Fae PippinWatlington, Johntay Doolen, Chaplain, [facility], Pager [phone]

## 2016-12-03 NOTE — Code Documentation (Signed)
Organ procurement team notified.

## 2016-12-03 DEATH — deceased

## 2017-02-27 IMAGING — DX DG CHEST 2V
2 series · 2 of 2 positions shown · non-contrast
Comparison: Prior chest x-ray 05/29/2014

CLINICAL DATA: 64-year-old female with shortness of breath for the
past 2 weeks

EXAM:
CHEST  2 VIEW

[chest pa]
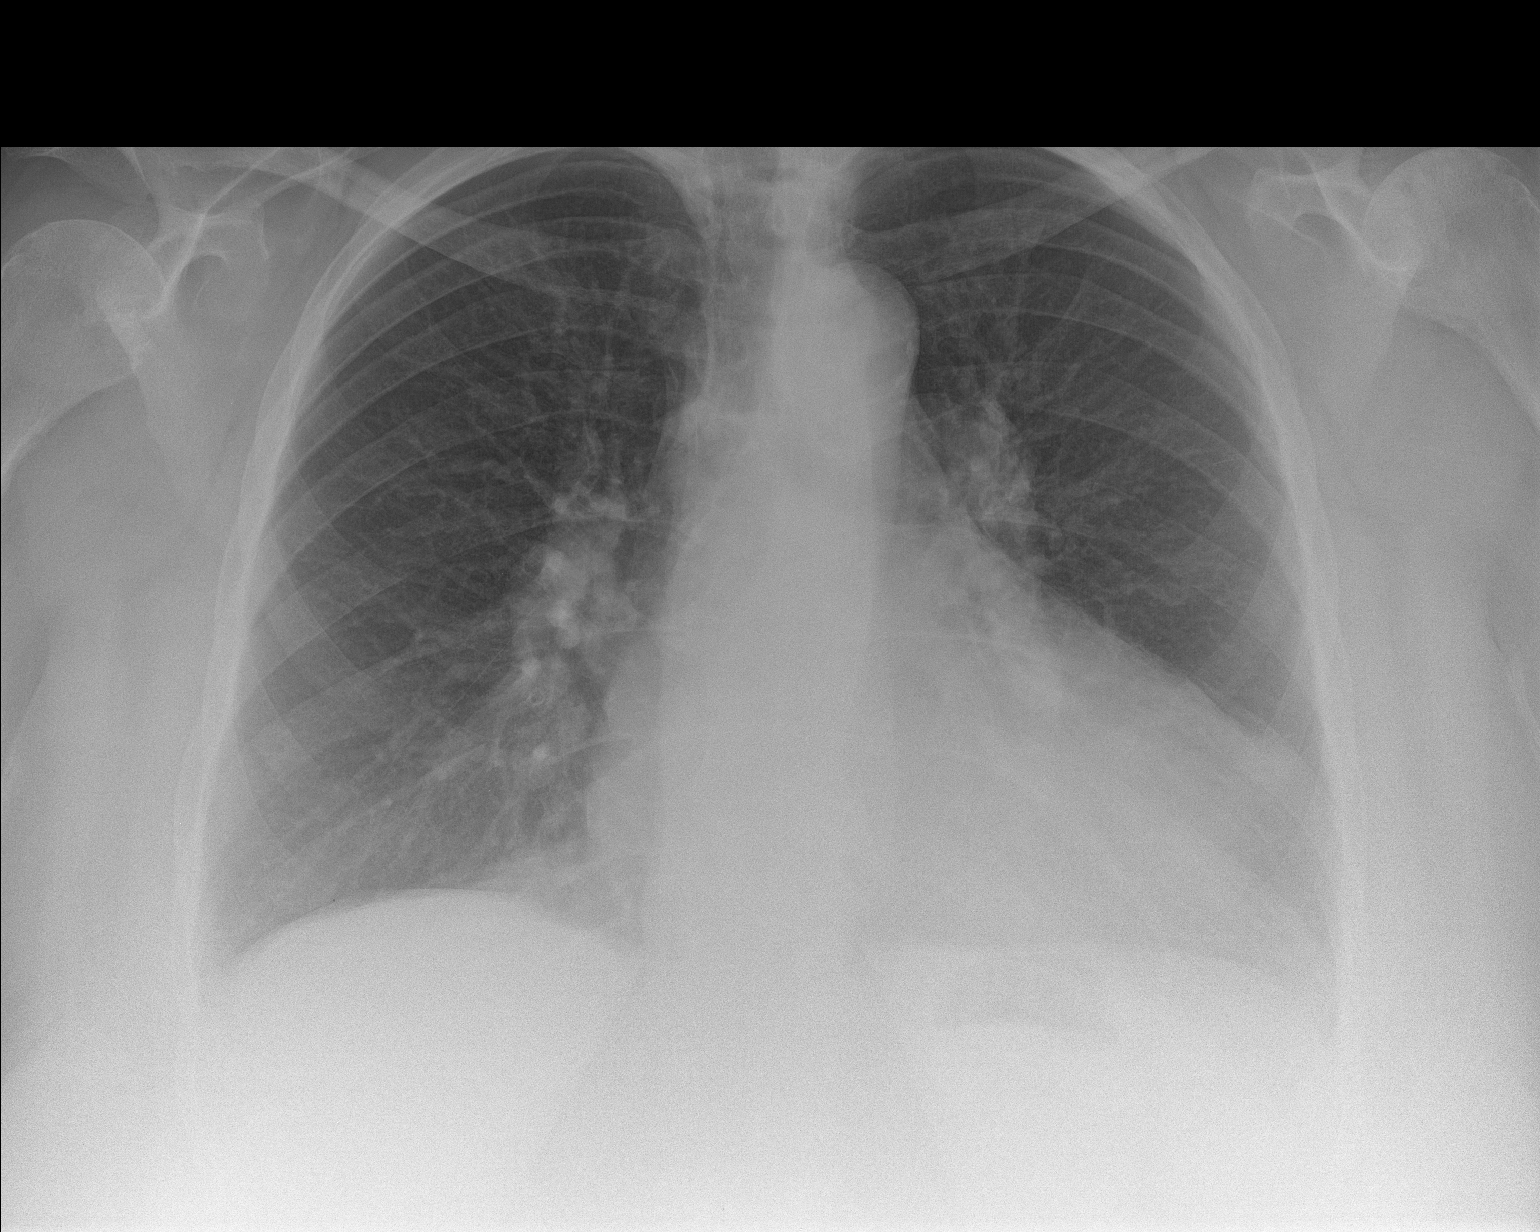

[chest lat]
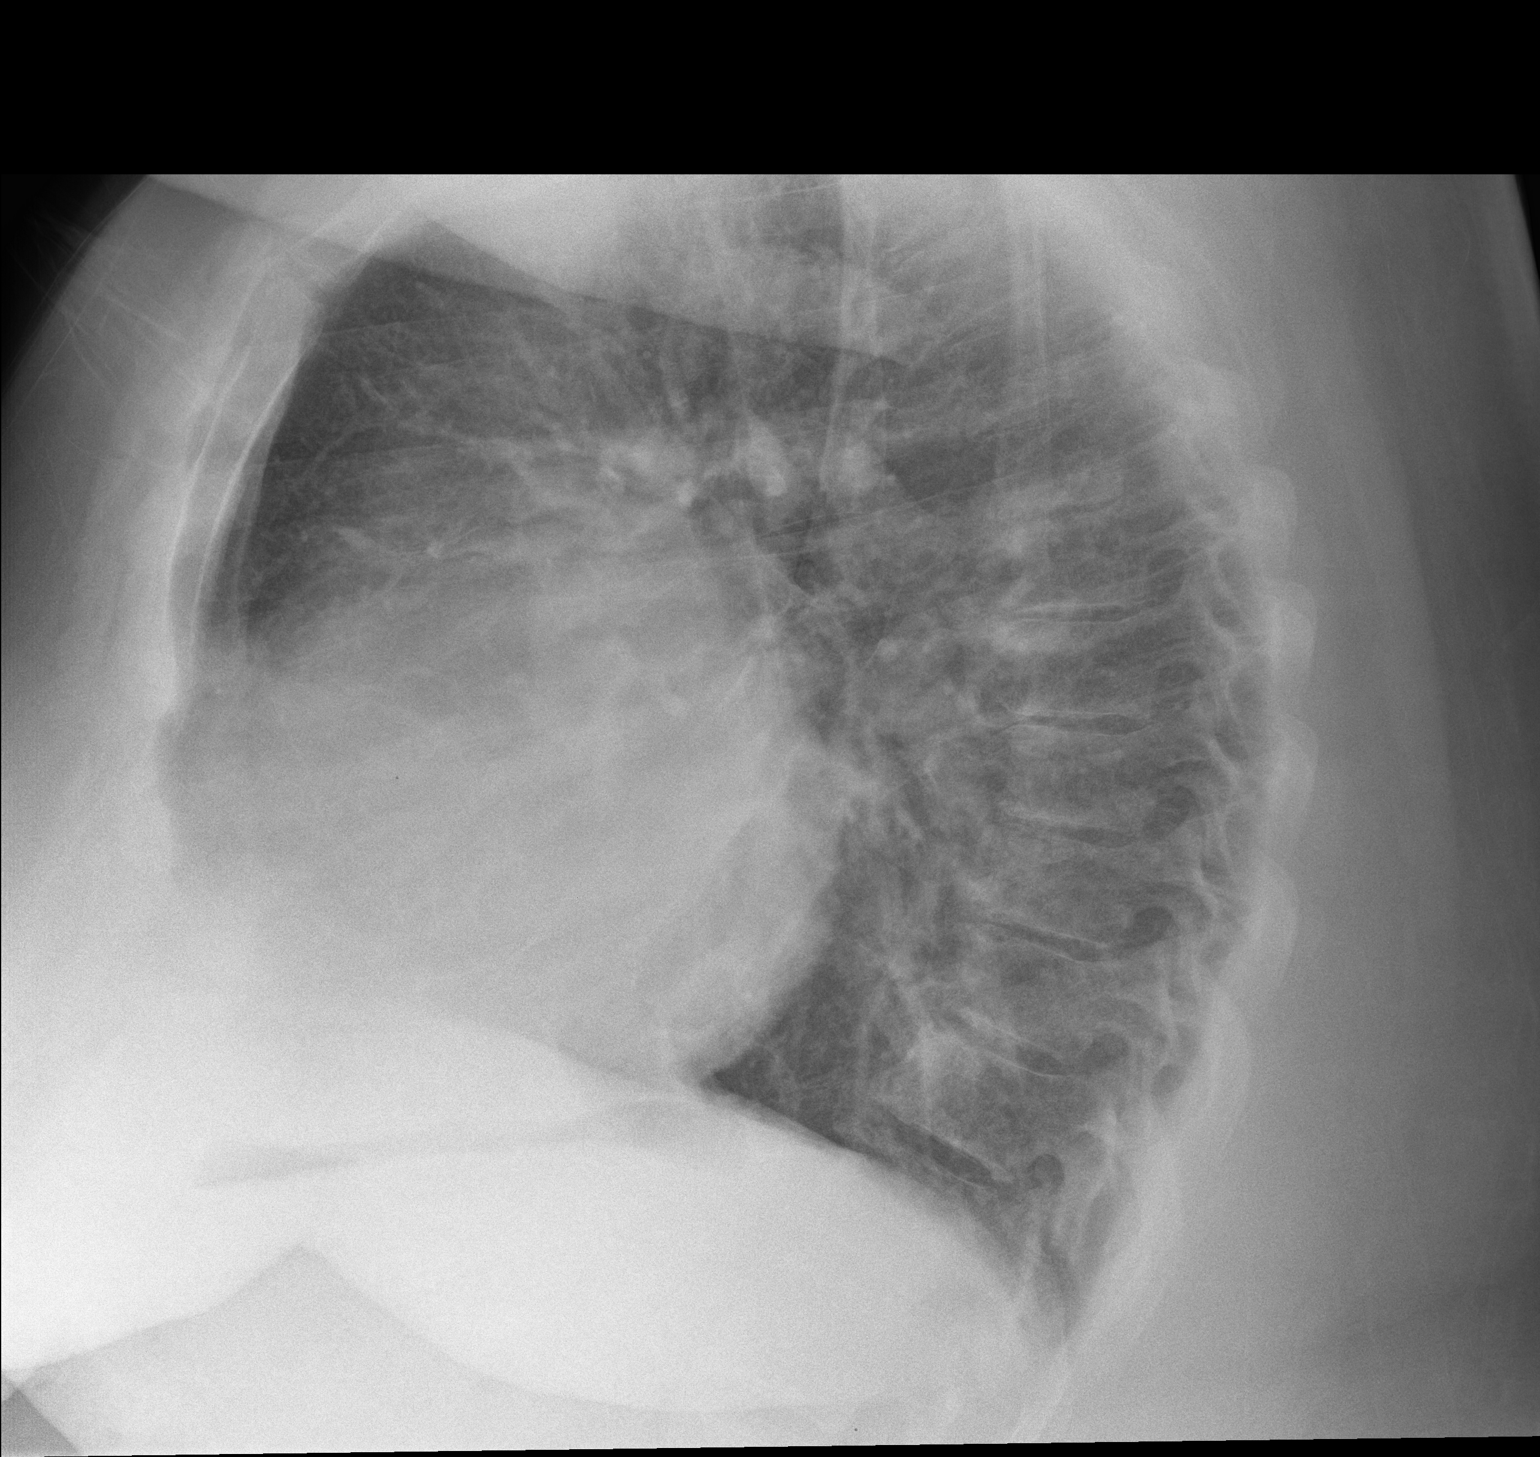

[2 of 2 positions shown; findings below may reference images not displayed]

FINDINGS: Cardiomegaly with left heart enlargement similar compared to prior.
No pulmonary edema, pleural effusion or pneumothorax. Trace
atherosclerotic calcifications present in the transverse aorta. The
lungs remain mildly hyperinflated. No acute osseous abnormality.
IMPRESSION: No active cardiopulmonary disease.
